# Patient Record
Sex: Female | Born: 1935 | Race: Black or African American | Hispanic: No | Smoking: Never smoker
Health system: Southern US, Community
[De-identification: ages and names within clinical notes are randomized; demographics above are authoritative.]

## PROBLEM LIST (undated history)

## (undated) DIAGNOSIS — L89152 Pressure ulcer of sacral region, stage 2: Secondary | ICD-10-CM

## (undated) DIAGNOSIS — M1990 Unspecified osteoarthritis, unspecified site: Secondary | ICD-10-CM

## (undated) DIAGNOSIS — T7840XA Allergy, unspecified, initial encounter: Secondary | ICD-10-CM

## (undated) DIAGNOSIS — C50919 Malignant neoplasm of unspecified site of unspecified female breast: Secondary | ICD-10-CM

## (undated) DIAGNOSIS — G309 Alzheimer's disease, unspecified: Secondary | ICD-10-CM

## (undated) DIAGNOSIS — F0280 Dementia in other diseases classified elsewhere without behavioral disturbance: Secondary | ICD-10-CM

## (undated) HISTORY — DX: Alzheimer's disease, unspecified: G30.9

## (undated) HISTORY — DX: Dementia in other diseases classified elsewhere without behavioral disturbance: F02.80

## (undated) HISTORY — DX: Malignant neoplasm of unspecified site of unspecified female breast: C50.919

## (undated) HISTORY — DX: Unspecified osteoarthritis, unspecified site: M19.90

## (undated) HISTORY — DX: Allergy, unspecified, initial encounter: T78.40XA

## (undated) HISTORY — DX: Pressure ulcer of sacral region, stage 2: L89.152

---

## 1958-09-03 HISTORY — PX: ABDOMINAL HYSTERECTOMY: SHX81

## 2004-01-03 DIAGNOSIS — F0280 Dementia in other diseases classified elsewhere without behavioral disturbance: Secondary | ICD-10-CM

## 2004-01-03 HISTORY — DX: Dementia in other diseases classified elsewhere, unspecified severity, without behavioral disturbance, psychotic disturbance, mood disturbance, and anxiety: F02.80

## 2005-01-02 HISTORY — PX: MASTECTOMY: SHX3

## 2007-02-03 ENCOUNTER — Ambulatory Visit: Payer: Self-pay | Admitting: Oncology

## 2007-02-21 ENCOUNTER — Ambulatory Visit: Payer: Self-pay | Admitting: Oncology

## 2007-03-03 ENCOUNTER — Ambulatory Visit: Payer: Self-pay | Admitting: Oncology

## 2007-03-06 ENCOUNTER — Ambulatory Visit: Payer: Self-pay | Admitting: Oncology

## 2007-04-03 ENCOUNTER — Ambulatory Visit: Payer: Self-pay | Admitting: Oncology

## 2007-05-03 ENCOUNTER — Ambulatory Visit: Payer: Self-pay | Admitting: Oncology

## 2007-06-26 ENCOUNTER — Ambulatory Visit: Payer: Self-pay | Admitting: Oncology

## 2007-07-03 ENCOUNTER — Ambulatory Visit: Payer: Self-pay | Admitting: Oncology

## 2007-08-03 ENCOUNTER — Ambulatory Visit: Payer: Self-pay | Admitting: Oncology

## 2007-08-26 ENCOUNTER — Ambulatory Visit: Payer: Self-pay | Admitting: Oncology

## 2007-09-03 ENCOUNTER — Ambulatory Visit: Payer: Self-pay | Admitting: Oncology

## 2007-10-03 ENCOUNTER — Ambulatory Visit: Payer: Self-pay | Admitting: Oncology

## 2007-10-31 ENCOUNTER — Ambulatory Visit: Payer: Self-pay | Admitting: Internal Medicine

## 2007-10-31 DIAGNOSIS — F0280 Dementia in other diseases classified elsewhere without behavioral disturbance: Secondary | ICD-10-CM | POA: Insufficient documentation

## 2007-10-31 DIAGNOSIS — R443 Hallucinations, unspecified: Secondary | ICD-10-CM | POA: Insufficient documentation

## 2007-10-31 DIAGNOSIS — G309 Alzheimer's disease, unspecified: Secondary | ICD-10-CM

## 2007-10-31 DIAGNOSIS — Z853 Personal history of malignant neoplasm of breast: Secondary | ICD-10-CM | POA: Insufficient documentation

## 2007-10-31 DIAGNOSIS — J309 Allergic rhinitis, unspecified: Secondary | ICD-10-CM | POA: Insufficient documentation

## 2007-11-03 ENCOUNTER — Ambulatory Visit: Payer: Self-pay | Admitting: Oncology

## 2007-11-06 LAB — CONVERTED CEMR LAB
ALT: 20 units/L (ref 0–35)
Chloride: 108 meq/L (ref 96–112)
Eosinophils Absolute: 0.3 10*3/uL (ref 0.0–0.7)
Eosinophils Relative: 3.9 % (ref 0.0–5.0)
GFR calc Af Amer: 70 mL/min
HCT: 37.1 % (ref 36.0–46.0)
Hemoglobin: 12.2 g/dL (ref 12.0–15.0)
MCV: 91 fL (ref 78.0–100.0)
Monocytes Absolute: 1.2 10*3/uL — ABNORMAL HIGH (ref 0.1–1.0)
Monocytes Relative: 16.5 % — ABNORMAL HIGH (ref 3.0–12.0)
Neutro Abs: 3.1 10*3/uL (ref 1.4–7.7)
Phosphorus: 3.5 mg/dL (ref 2.3–4.6)
Platelets: 222 10*3/uL (ref 150–400)
Potassium: 3.9 meq/L (ref 3.5–5.1)
RDW: 12.7 % (ref 11.5–14.6)
Sodium: 144 meq/L (ref 135–145)
TSH: 1.03 microintl units/mL (ref 0.35–5.50)
Total Bilirubin: 0.6 mg/dL (ref 0.3–1.2)
Total Protein: 7.1 g/dL (ref 6.0–8.3)
Vitamin B-12: 749 pg/mL (ref 211–911)

## 2007-11-18 ENCOUNTER — Ambulatory Visit: Payer: Self-pay | Admitting: Oncology

## 2007-12-03 ENCOUNTER — Ambulatory Visit: Payer: Self-pay | Admitting: Oncology

## 2007-12-25 ENCOUNTER — Ambulatory Visit: Payer: Self-pay | Admitting: Internal Medicine

## 2007-12-25 DIAGNOSIS — H1130 Conjunctival hemorrhage, unspecified eye: Secondary | ICD-10-CM | POA: Insufficient documentation

## 2008-06-15 ENCOUNTER — Encounter: Payer: Self-pay | Admitting: Internal Medicine

## 2008-06-24 ENCOUNTER — Ambulatory Visit: Payer: Self-pay | Admitting: Internal Medicine

## 2008-06-24 DIAGNOSIS — M170 Bilateral primary osteoarthritis of knee: Secondary | ICD-10-CM | POA: Insufficient documentation

## 2008-11-03 ENCOUNTER — Ambulatory Visit: Payer: Self-pay | Admitting: Internal Medicine

## 2008-12-24 ENCOUNTER — Ambulatory Visit: Payer: Self-pay | Admitting: Internal Medicine

## 2008-12-24 ENCOUNTER — Encounter: Payer: Self-pay | Admitting: Internal Medicine

## 2009-01-11 ENCOUNTER — Encounter: Payer: Self-pay | Admitting: Internal Medicine

## 2009-01-11 LAB — HM MAMMOGRAPHY: HM Mammogram: NORMAL

## 2009-03-17 ENCOUNTER — Ambulatory Visit: Payer: Self-pay | Admitting: Internal Medicine

## 2009-03-17 DIAGNOSIS — B351 Tinea unguium: Secondary | ICD-10-CM | POA: Insufficient documentation

## 2009-05-04 ENCOUNTER — Ambulatory Visit: Payer: Self-pay | Admitting: Internal Medicine

## 2009-06-01 ENCOUNTER — Encounter: Payer: Self-pay | Admitting: Internal Medicine

## 2009-07-14 ENCOUNTER — Telehealth: Payer: Self-pay | Admitting: Family Medicine

## 2009-07-15 ENCOUNTER — Encounter: Payer: Self-pay | Admitting: Internal Medicine

## 2009-07-15 ENCOUNTER — Encounter: Payer: Self-pay | Admitting: Family Medicine

## 2009-08-30 ENCOUNTER — Telehealth: Payer: Self-pay | Admitting: Internal Medicine

## 2009-08-30 ENCOUNTER — Encounter: Payer: Self-pay | Admitting: Internal Medicine

## 2009-10-07 ENCOUNTER — Encounter: Payer: Self-pay | Admitting: Internal Medicine

## 2009-10-07 ENCOUNTER — Ambulatory Visit: Payer: Self-pay | Admitting: Internal Medicine

## 2009-10-15 ENCOUNTER — Ambulatory Visit: Payer: Self-pay | Admitting: Internal Medicine

## 2009-10-15 DIAGNOSIS — L989 Disorder of the skin and subcutaneous tissue, unspecified: Secondary | ICD-10-CM | POA: Insufficient documentation

## 2009-10-18 ENCOUNTER — Encounter: Payer: Self-pay | Admitting: Internal Medicine

## 2009-11-29 ENCOUNTER — Encounter: Payer: Self-pay | Admitting: Internal Medicine

## 2009-11-30 ENCOUNTER — Telehealth: Payer: Self-pay | Admitting: Internal Medicine

## 2009-11-30 DIAGNOSIS — E119 Type 2 diabetes mellitus without complications: Secondary | ICD-10-CM | POA: Insufficient documentation

## 2009-12-01 ENCOUNTER — Telehealth: Payer: Self-pay | Admitting: Internal Medicine

## 2009-12-01 ENCOUNTER — Ambulatory Visit: Payer: Self-pay | Admitting: Internal Medicine

## 2009-12-02 ENCOUNTER — Encounter: Payer: Self-pay | Admitting: Internal Medicine

## 2009-12-02 ENCOUNTER — Telehealth: Payer: Self-pay | Admitting: Internal Medicine

## 2009-12-02 LAB — CONVERTED CEMR LAB
Alkaline Phosphatase: 109 units/L (ref 39–117)
BUN: 20 mg/dL (ref 6–23)
Basophils Absolute: 0 10*3/uL (ref 0.0–0.1)
Creatinine, Ser: 1.1 mg/dL (ref 0.4–1.2)
GFR calc non Af Amer: 65.48 mL/min (ref >60)
Glucose, Bld: 283 mg/dL — ABNORMAL HIGH (ref 70–99)
HCT: 43.9 % (ref 36.0–46.0)
Hgb A1c MFr Bld: 12.3 % — ABNORMAL HIGH (ref 4.6–6.5)
Lymphocytes Relative: 31.7 % (ref 12.0–46.0)
Lymphs Abs: 3.9 10*3/uL (ref 0.7–4.0)
Monocytes Relative: 6.7 % (ref 3.0–12.0)
Neutrophils Relative %: 60.9 % (ref 43.0–77.0)
Phosphorus: 3.3 mg/dL (ref 2.3–4.6)
Platelets: 220 10*3/uL (ref 150.0–400.0)
RDW: 14.5 % (ref 11.5–14.6)
Sodium: 150 meq/L — ABNORMAL HIGH (ref 135–145)
TSH: 0.91 microintl units/mL (ref 0.35–5.50)
Total Bilirubin: 0.6 mg/dL (ref 0.3–1.2)

## 2009-12-08 ENCOUNTER — Encounter: Payer: Self-pay | Admitting: Internal Medicine

## 2009-12-09 ENCOUNTER — Telehealth: Payer: Self-pay | Admitting: Internal Medicine

## 2009-12-09 ENCOUNTER — Encounter: Payer: Self-pay | Admitting: Internal Medicine

## 2009-12-30 ENCOUNTER — Encounter: Payer: Self-pay | Admitting: Internal Medicine

## 2010-01-20 ENCOUNTER — Encounter: Payer: Self-pay | Admitting: Internal Medicine

## 2010-01-20 DIAGNOSIS — G309 Alzheimer's disease, unspecified: Secondary | ICD-10-CM

## 2010-01-20 DIAGNOSIS — E119 Type 2 diabetes mellitus without complications: Secondary | ICD-10-CM

## 2010-01-20 DIAGNOSIS — M1990 Unspecified osteoarthritis, unspecified site: Secondary | ICD-10-CM

## 2010-01-20 DIAGNOSIS — R443 Hallucinations, unspecified: Secondary | ICD-10-CM

## 2010-01-20 DIAGNOSIS — F0280 Dementia in other diseases classified elsewhere without behavioral disturbance: Secondary | ICD-10-CM

## 2010-02-01 NOTE — Miscellaneous (Signed)
Summary: BS Orders/[facility] Orders/[facility]   Imported By: Lanelle Bal 12/04/2009 08:56:42  _____________________________________________________________________  External Attachment:    Type:   Image     Comment:   External Document

## 2010-02-01 NOTE — Miscellaneous (Signed)
Summary: Physician's Comments/[facility]  Physician's Comments/[facility]   Imported By: Sherian Rein 12/07/2009 12:17:31  _____________________________________________________________________  External Attachment:    Type:   Image     Comment:   External Document

## 2010-02-01 NOTE — Medication Information (Signed)
Summary: Diabetes Testing Supplies/[facility]  Diabetes Testing Supplies/[facility]   Imported By: Sherian Rein 12/07/2009 12:12:56  _____________________________________________________________________  External Attachment:    Type:   Image     Comment:   External Document

## 2010-02-01 NOTE — Miscellaneous (Signed)
Summary: FL2/[facility]   Imported By: Lanelle Bal 07/20/2009 12:36:36  _____________________________________________________________________  External Attachment:    Type:   Image     Comment:   External Document

## 2010-02-01 NOTE — Miscellaneous (Signed)
Summary: Lorazepam Order/[facility]  Lorazepam Order/[facility]   Imported By: Lanelle Bal 07/20/2009 12:37:26  _____________________________________________________________________  External Attachment:    Type:   Image     Comment:   External Document

## 2010-02-01 NOTE — Miscellaneous (Signed)
Summary: FL-2, Standing Orders, Care Plan/[facility]  FL-2, Standing Orders, Care Plan/[facility]   Imported By: Maryln Gottron 10/22/2009 14:07:23  _____________________________________________________________________  External Attachment:    Type:   Image     Comment:   External Document

## 2010-02-01 NOTE — Progress Notes (Signed)
Summary: call a nurse   Phone Note Call from Patient   Summary of Call: Triage Record Num: [id number] Operator: Kathleen Lime Patient Name: Tanya Clayton Call Date & Time: 11/30/2009 9:25:50PM Patient Phone: [phone] PCP: Tillman Abide Patient Gender: Female PCP Fax : [phone] Patient DOB: Sep 30, 1935 Practice Name: [facility] Reason for Call: Follow up note to Triage call from tonight 11/29. Patient had high glucose readings today. Orders were given per Dr Orlie Dakin to give insulin and check blood sugars (see previous note). Dr Artist Pais was contacted due to BS reading of 585 at 19:30 and gave orders as follows: Give 10 units of Novolog now. Check blood sugar in 1 hour, if over 400 give 20 units of Lantus. Start patient on Diabetic Diet, limit carbs to 25gms per meal. No sweetened juices or deserts. Increase water intake. Patient to f/u with Trenisha Lafavor tomorrow. Emergent sxs for Diabetes: Control Problems guidline was to call Jacobb Alen immediately. Information/orders given to Clois Dupes at [facility] (facility). Protocol(s) Used: Office Note Recommended Outcome per Protocol: Information Noted and Sent to Office Reason for Outcome: Caller information to office Care Advice:  ~ 11/30/2009 9:40:41PM Page Initial call taken by: Melody Comas,  December 01, 2009 8:07 AM  Follow-up for Phone Call        Triage Record Num: [id number] Operator: Kathleen Lime Patient Name: Tanya Clayton Call Date & Time: 11/30/2009 8:19:40PM Patient Phone: [phone] PCP: Tillman Abide Patient Gender: Female PCP Fax : [phone] Patient DOB: Jun 30, 1935 Practice Name: [facility] Reason for Call: Clois Dupes resident care coordinator Called .Patient who has not been previously dx with Diabetes had a blood sugar that was too high to register on mete at about 1400 todayr. Dr Alphonsus Sias ordered Novolog 10 units to be given immediatley. He ordered Insulin orders as follows: Repeat  Novolog 10U if bs is over 400, Lantus 10 units to be given daily, BS to be checked BID for 2 weeks. BS checked again at 19:30- results 585, Lantus 10 units given per order. Staff wants to know if Novolg 10 units should be given again now as ordered for BS greater than 400. Protocol(s) Used: Diabetes: Control Problems Recommended Outcome per Protocol: Call Zhania Shaheen Immediately Reason for Outcome: Blood sugar 250 mg/dl or higher even after taking extra insulin Care Advice:  ~ 11/ Follow-up by: Melody Comas,  December 01, 2009 8:09 AM

## 2010-02-01 NOTE — Letter (Signed)
Summary: Medical Info Form/[facility]  Medical Info Form/[facility]   Imported By: Lanelle Bal 06/08/2009 10:02:03  _____________________________________________________________________  External Attachment:    Type:   Image     Comment:   External Document

## 2010-02-01 NOTE — Progress Notes (Signed)
Summary: order form for diabetic supplies  Phone Note From Pharmacy   Caller: [facility] Summary of Call: Order form for diabetic supplies is on your desk. Initial call taken by: Lowella Petties CMA, AAMA,  December 02, 2009 12:39 PM  Follow-up for Phone Call        form done Follow-up by: Cindee Salt MD,  December 02, 2009 1:12 PM  Additional Follow-up for Phone Call Additional follow up Details #1::        form faxed and scanned, form mailed to [facility] Additional Follow-up by: Mervin Hack CMA Duncan Dull),  December 02, 2009 1:22 PM

## 2010-02-01 NOTE — Letter (Signed)
Summary: Results Follow up Letter  [place] [facility]  [address] [place], [place] [address]   Phone: [phone]  Fax: [phone]    01/11/2009 MRN: [id number]  [facility] [address] [place], [place]  [address]  Dear Ms. Romito,  The following are the results of your recent test(s):  Test         Result    Pap Smear:        Normal _____  Not Normal _____ Comments: ______________________________________________________ Cholesterol: LDL(Bad cholesterol):         Your goal is less than:         HDL (Good cholesterol):       Your goal is more than: Comments:  ______________________________________________________ Mammogram:        Normal __X___  Not Normal _____ Comments: Repeat in 1 year  ___________________________________________________________________ Hemoccult:        Normal _____  Not normal _______ Comments:    _____________________________________________________________________ Other Tests:    We routinely do not discuss normal results over the telephone.  If you desire a copy of the results, or you have any questions about this information we can discuss them at your next office visit.   Sincerely,      Tillman Abide, MD

## 2010-02-01 NOTE — Miscellaneous (Signed)
Summary: FL2  FL2   Imported By: Lanelle Bal 05/10/2009 10:18:09  _____________________________________________________________________  External Attachment:    Type:   Image     Comment:   External Document

## 2010-02-01 NOTE — Progress Notes (Signed)
Summary: blood sugar is elevated  Phone Note From Other Clinic   Caller: Clois Dupes at [facility]  [phone] Summary of Call: Eunice Blase checked pt's blood sugar, after eating, and it didnt register on the glucometer.  She checked another pt's BS with the same meter and it worked fine.  She thinks pt's blood sugar is above 500.  BP is 158/100,  complaining of a headache. Initial call taken by: Lowella Petties CMA, AAMA,  November 30, 2009 2:54 PM  Follow-up for Phone Call        will need to start insulin and check daily will also give 1 time dose of rapid acting insulin consider metformin  Orders written  discussed with Debbie Follow-up by: Cindee Salt MD,  November 30, 2009 3:22 PM  New Problems: DIABETES MELLITUS, TYPE II (ICD-250.00)   New Problems: DIABETES MELLITUS, TYPE II (ICD-250.00) New/Updated Medications: LANTUS SOLOSTAR 100 UNIT/ML SOLN (INSULIN GLARGINE) 10 units subcutaneously daily   Past History:  Past Medical History: Breast cancer, hx of---------------------------------------------Dr Marcell Anger [facility]) Altzheimer's-2006  -----------------------------------------------Dr Renaee Munda Allergic rhinitis Osteoarthritis Diabetes mellitus, type II

## 2010-02-01 NOTE — Progress Notes (Signed)
Summary: Need FL-2 updated  Phone Note From Other Clinic   Caller: [facility] Call For: Dr.Letvak/ Dr. Dayton Martes Summary of Call: received fax asking for Dr. Vassie Moselle signature on FL-2 form. I called Bonita Quin and advised that Dr.Letvak will not be in until Monday, I asked if Dr. Dayton Martes could sign, she said yes just have her inital and date. Pt will be moving in 07/15/2009 and the fl-2 is dated 05/04/2009, per [employer] says it must be signed 24hr before move in. Bonita Quin did say if pt came with enough meds to last her until monday then Dr. Dayton Martes wouldn't need to sign, BUT if she didn't then the pharmacy won't fill with out up to date fl-2. Dr. Dayton Martes the form is in your inbox. Initial call taken by: Mervin Hack CMA Duncan Dull),  July 14, 2009 4:35 PM  Follow-up for Phone Call        in my box. Ruthe Mannan MD  July 15, 2009 7:44 AM  form faxed and scanned, by Lupita Leash Follow-up by: Mervin Hack CMA Duncan Dull),  July 15, 2009 8:59 AM

## 2010-02-01 NOTE — Assessment & Plan Note (Signed)
Summary: ROA 6 MTHS CYD   Vital Signs:  Patient profile:   75 year old female Weight:      194 pounds BMI:     36.79 Temp:     98.0 degrees F oral Pulse rate:   76 / minute Pulse rhythm:   regular BP sitting:   130 / 80  (left arm) Cuff size:   regular  Vitals Entered By: Mervin Hack CMA Duncan Dull) (May 04, 2009 9:17 AM) CC: 6 month follow-up   History of Present Illness: Planning to have her move to [place] this should be happening soon  FL-2 needs to be done  Dementia has worsened challenges with bathroom and bathing---- still needs bath drawn then washes herself needs help to clean after toilet Needs cueing --like putting toothpaste on brush Dresses with help---needs set up Still continent  more confusion Hasn't had elopement attempts but goes up and down steps repeatedly--pacing, etc Anxious at times  Still with hallucinations No increase Still on seroquel --helps per son No paranoia  No apparent arthritis problems    Allergies: No Known Drug Allergies  Past History:  Past medical, surgical, family and social histories (including risk factors) reviewed for relevance to current acute and chronic problems.  Past Medical History: Reviewed history from 06/24/2008 and no changes required. Breast cancer, hx of---------------------------------------------Dr Marcell Anger [facility]) Altzheimer's-2006  -----------------------------------------------Dr Renaee Munda Allergic rhinitis Osteoarthritis  Past Surgical History: Reviewed history from 10/31/2007 and no changes required. Right mastectomy with implant--2007 Hysterectomy in 1960's  Family History: Reviewed history from 10/31/2007 and no changes required. Mom died of DM complications? Not clear about other history except 2 sisters with brain tumors Brother and sister also with dementia  Social History: Reviewed history from 10/31/2007 and no changes required. Widowed 2009 1 son, 1 step daughter Never  Smoked Alcohol use-no Retired--schoolteacher  Review of Systems       appetite is fine weight is stable sleep is some better but still up intermittently. Lorazepam seems to help some  Physical Exam  General:  alert and normal appearance.   Neck:  supple, no masses, no thyromegaly, no carotid bruits, and no cervical lymphadenopathy.   Lungs:  normal respiratory effort and normal breath sounds.   Heart:  normal rate, regular rhythm, no murmur, and no gallop.   Abdomen:  soft and non-tender.   Neurologic:  alert not on task for conversations Psych:  normally interactive, good eye contact, not anxious appearing, and not depressed appearing.     Impression & Recommendations:  Problem # 1:  ALZHEIMERS DISEASE (ICD-331.0) Assessment Deteriorated slow deterioration ready for assisted living Fl-2 done  Problem # 2:  HALLUCINATIONS (ICD-780.1) Assessment: Unchanged still with good control on seroquel  Problem # 3:  OSTEOARTHRITIS (ICD-715.90) Assessment: Unchanged no major issues  Complete Medication List: 1)  Seroquel 100 Mg Tabs (Quetiapine fumarate) .... Take 1 tab by mouth at bedtime 2)  Lorazepam 0.5 Mg Tabs (Lorazepam) .... Take 1-2 tabs by mouth at bedtime as needed  Patient Instructions: 1)  Please schedule a follow-up appointment in 6 months .   Current Allergies (reviewed today): No known allergies

## 2010-02-01 NOTE — Assessment & Plan Note (Signed)
Summary: 8:15 ?TOE INFECTION,TB TEST/CLE   Vital Signs:  Patient profile:   75 year old female Weight:      194 pounds Temp:     98.1 degrees F oral Pulse rate:   60 / minute Pulse rhythm:   regular BP sitting:   120 / 70  (left arm) Cuff size:   regular  Vitals Entered By: Mervin Hack CMA Duncan Dull) (March 17, 2009 8:23 AM) CC: toe infection/ PPD   History of Present Illness: Has been having some pain in her right foot Especially noted during pedicure several days ago No problems walking on it Doesn't look right--- per DIL NO fever No discharge from toe    Allergies: No Known Drug Allergies  Past History:  Past medical, surgical, family and social histories (including risk factors) reviewed for relevance to current acute and chronic problems.  Past Medical History: Reviewed history from 06/24/2008 and no changes required. Breast cancer, hx of---------------------------------------------Dr Marcell Anger [facility]) Altzheimer's-2006  -----------------------------------------------Dr Renaee Munda Allergic rhinitis Osteoarthritis  Past Surgical History: Reviewed history from 10/31/2007 and no changes required. Right mastectomy with implant--2007 Hysterectomy in 1960's  Family History: Reviewed history from 10/31/2007 and no changes required. Mom died of DM complications? Not clear about other history except 2 sisters with brain tumors Brother and sister also with dementia  Social History: Reviewed history from 10/31/2007 and no changes required. Widowed 2009 1 son, 1 step daughter Never Smoked Alcohol use-no Retired--schoolteacher  Review of Systems       not acting sick eating okay due for PPD for her adult day care  Physical Exam  General:  alert and normal appearance.   Extremities:  NO edema Fungal toenail on right great toe no redness, discharge or tenderness   Impression & Recommendations:  Problem # 1:  ONYCHOMYCOSIS (ICD-110.1) Assessment  New asymptomatic discussed vick's vaporub or terbenafine with DIL but did not advise Rx  Complete Medication List: 1)  Seroquel 100 Mg Tabs (Quetiapine fumarate) .... Take 1 tab by mouth at bedtime 2)  Lorazepam 0.5 Mg Tabs (Lorazepam) .... Take 1-2 tabs by mouth at bedtime as needed  Other Orders: TB Skin Test [phone]) Admin 1st Vaccine (01093) Admin 1st Vaccine [facility]) [phone])  Patient Instructions: 1)  Please keep May 3rd appt  Current Allergies (reviewed today): No known allergies    PPD Application    Vaccine Type: PPD    Site: left forearm    Mfr: Sanofi Pasteur    Dose: 0.1 ml    Route: ID    Given by: Mervin Hack CMA (AAMA)    Exp. Date: 05/30/2011    Lot #: [id number]  Appended Document: 8:15 ?TOE INFECTION,TB TEST/CLE TB Skin test read today, 59mm-negative.  Nikki L. Fenton Malling, CMA (AAMA) 03/19/2009 10:04 AM

## 2010-02-01 NOTE — Progress Notes (Signed)
Summary: pt has been agitated  Phone Note From Other Clinic   Caller: Marcelino Duster at [facility]  [phone] Summary of Call: Pt has been very agitated since the week end-" mean", refuses help, doesnt want to do anything, undressing in the hallway- and aid is asking if they can increase her lorazepam dosing.  Right now she is only taking one at bedtime as needed, limit of one every 24 hours.  Fax  is [phone] Initial call taken by: Lowella Petties CMA,  August 30, 2009 10:53 AM  Follow-up for Phone Call        okay to change to three times a day as needed  Follow-up by: Cindee Salt MD,  August 30, 2009 10:58 AM  Additional Follow-up for Phone Call Additional follow up Details #1::        order faxed to [phone] Additional Follow-up by: DeShannon Smith CMA Duncan Dull),  August 30, 2009 11:13 AM    New/Updated Medications: LORAZEPAM 0.5 MG TABS (LORAZEPAM) take 1-2 tabs by mouth three times a day as needed agitation or anxiety

## 2010-02-01 NOTE — Assessment & Plan Note (Signed)
Summary: [facility]   Vital Signs:  Patient profile:   75 year old female Weight:      198 pounds BMI:     37.55 Temp:     96 degrees F Pulse rate:   96 / minute Resp:     18 per minute BP sitting:   116 / 70  History of Present Illness: Has been at [facility] for a few months Reviewed with Linda--clinical coordinator  Son here for the   visit [facility] Has had some wandering behaviors  remains incontinent and staff on toileting program  There has been some cognitive and functional decline since coming here Not depressed hasn't needed as needed lorazepam  Appetite has been good Sleeping okay--esp with PM lorazepam  No arthritis pain No sig edema  No allergy problems Only in the spring and mild then  Allergies: No Known Drug Allergies  Past History:  Past medical, surgical, family and social histories (including risk factors) reviewed for relevance to current acute and chronic problems.  Past Medical History: Reviewed history from 06/24/2008 and no changes required. Breast cancer, hx of---------------------------------------------Dr Marcell Anger [facility]) Altzheimer's-2006  -----------------------------------------------Dr Renaee Munda Allergic rhinitis Osteoarthritis  Past Surgical History: Reviewed history from 10/31/2007 and no changes required. Right mastectomy with implant--2007 Hysterectomy in 1960's  Family History: Reviewed history from 10/31/2007 and no changes required. Mom died of DM complications? Not clear about other history except 2 sisters with brain tumors Brother and sister also with dementia  Social History: Reviewed history from 10/31/2007 and no changes required. Widowed 2009 1 son, 1 step daughter Never Smoked Alcohol use-no Retired--schoolteacher  Review of Systems  The patient denies chest pain, syncope, and dyspnea on exertion.         weight is up 4#   Physical  Exam  General:  alert and normal appearance.   Neck:  supple, no masses, no thyromegaly, no carotid bruits, and no cervical lymphadenopathy.   Lungs:  normal respiratory effort, no intercostal retractions, no accessory muscle use, and normal breath sounds.   Heart:  normal rate, regular rhythm, no murmur, and no gallop.   Abdomen:  soft and non-tender.   Msk:  no joint tenderness and no joint swelling.   Extremities:  No edema Neurologic:  confused but pleasant Engages but not with logical speech walks okay Normal tone Psych:  not anxious appearing and not depressed appearing.     Impression & Recommendations:  Problem # 1:  ALZHEIMERS DISEASE (ICD-331.0) Assessment Comment Only slow progression but no major changes will be moving to the memory unit here soon  Problem # 2:  HALLUCINATIONS (ICD-780.1) Assessment: Unchanged controlled well with the seroquel will continue for now consider wean at next visit if doing well  Problem # 3:  OSTEOARTHRITIS (ICD-715.90) Assessment: Unchanged doing well without meds  Problem # 4:  ALLERGIC RHINITIS (ICD-477.9) Assessment: Comment Only spring only  Complete Medication List: 1)  Seroquel 100 Mg Tabs (Quetiapine fumarate) .... Take 1 tab by mouth at bedtime 2)  Lorazepam 0.5 Mg Tabs (Lorazepam) .... Take 1 tabs by mouth at bedtime and 1 tab twice a day as needed agitation or anxiety  Patient Instructions: 1)  Will plan follow up in  ~4 months

## 2010-02-01 NOTE — Assessment & Plan Note (Signed)
Summary: TB SKIN TEST/BRIUSE ON LEFT BREAST/[facility]   Vital Signs:  Patient profile:   75 year old female Weight:      196 pounds Temp:     97.8 degrees F oral BP sitting:   150 / 90  (left arm) Cuff size:   regular  Vitals Entered By: Mervin Hack CMA Duncan Dull) (October 15, 2009 12:19 PM) CC: bruise on breast, PPD skin test   History of Present Illness: Here with daughter  had just been seen last week Next day staff noted bruising on left breast She notes no pain no discharge  no change in status  Allergies: No Known Drug Allergies  Past History:  Past medical, surgical, family and social histories (including risk factors) reviewed for relevance to current acute and chronic problems.  Past Medical History: Reviewed history from 06/24/2008 and no changes required. Breast cancer, hx of---------------------------------------------Dr Marcell Anger [facility]) Altzheimer's-2006  -----------------------------------------------Dr Renaee Munda Allergic rhinitis Osteoarthritis  Past Surgical History: Reviewed history from 10/31/2007 and no changes required. Right mastectomy with implant--2007 Hysterectomy in 1960's  Family History: Reviewed history from 10/31/2007 and no changes required. Mom died of DM complications? Not clear about other history except 2 sisters with brain tumors Brother and sister also with dementia  Social History: Reviewed history from 10/31/2007 and no changes required. Widowed 2009 1 son, 1 step daughter Never Smoked Alcohol use-no Retired--schoolteacher  Review of Systems       eating okay no change in sleep  Physical Exam  General:  alert.  NAD Breasts:  lump under left subclavian scar (port a cath based on mammo 12/10) Skin:  slighly raised dark lesion (keratosis) on left breast  ~7 o'clock 10mm or so diameter central area of redness (as if lesion had been scraped off)   Impression & Recommendations:  Problem # 1:  SKIN LESION  (ICD-709.9) Assessment New may be benign lesion that has just been disturbed  P: observe    if that area doesn't scab and heal up, would treat as actinic with liquid nitrogen  Complete Medication List: 1)  Seroquel 100 Mg Tabs (Quetiapine fumarate) .... Take 1 tab by mouth at bedtime 2)  Lorazepam 0.5 Mg Tabs (Lorazepam) .... Take 1 tabs by mouth at bedtime and 1 tab twice a day as needed agitation or anxiety  Other Orders: TB Skin Test [phone]) Admin 1st Vaccine (13086) Admin 1st Vaccine [facility]) [phone])  Patient Instructions: 1)  Please set up nurse visit for Monday to check PPD 2)  Please call for appt if skin area doesn't heal in the next month or so 3)  Otherwise, visits will still be at [facility]  Current Allergies (reviewed today): No known allergies    PPD Application    Vaccine Type: PPD    Site: left forearm    Mfr: Sanofi Pasteur    Dose: 0.1 ml    Route: ID    Given by: Mervin Hack CMA (AAMA)    Exp. Date: 11/04/2010    Lot #: [id number]

## 2010-02-01 NOTE — Progress Notes (Signed)
Summary: needs order regarding blood sugar  Phone Note From Other Clinic   Caller: Debbie at [facility]  [phone] Summary of Call: Eunice Blase is asking for directions on when to call you regarding pt's blood sugar- how low and how high to let it go before they call.  Please fax order. Initial call taken by: Lowella Petties CMA, AAMA,  December 01, 2009 2:31 PM  Follow-up for Phone Call        orders done Please fax to the [facility] Follow-up by: Cindee Salt MD,  December 02, 2009 1:35 PM  Additional Follow-up for Phone Call Additional follow up Details #1::        order faxed and scanned Additional Follow-up by: DeShannon Katrinka Blazing CMA Duncan Dull),  December 02, 2009 4:44 PM

## 2010-02-01 NOTE — Progress Notes (Signed)
Summary: Lantus order  Phone Note Other Incoming   Summary of Call: Fax from [place] at [employer] in AM [phone] in evening  will increase lantus to 20 units daily continue two times a day checks for 1 more week Initial call taken by: Cindee Salt MD,  December 09, 2009 2:37 PM  Follow-up for Phone Call        order faxed back to [facility] and scanned. Follow-up by: Mervin Hack CMA Duncan Dull),  December 09, 2009 3:17 PM    New/Updated Medications: LANTUS SOLOSTAR 100 UNIT/ML SOLN (INSULIN GLARGINE) 20  units subcutaneously daily

## 2010-02-01 NOTE — Assessment & Plan Note (Signed)
Summary: blood sugar is elevated/alc   Vital Signs:  Patient profile:   75 year old female Weight:      184 pounds Temp:     98.0 degrees F tympanic BP sitting:   110 / 80  (left arm) Cuff size:   regular  Vitals Entered By: Mervin Hack CMA Duncan Dull) (December 01, 2009 12:49 PM) CC: check blood sugar   History of Present Illness: Sugars suddenly high sugar was 150 last month and now very high was checked for routine on antipsychotic See scanned notes  No history of DM in family She has felt fine No fevers  No cough No urinary symptoms  Allergies: No Known Drug Allergies  Review of Systems       weight is actually down appetite is fine has been sleeping fine has been thirsty and drinking more   Impression & Recommendations:  Problem # 1:  DIABETES MELLITUS, TYPE II (ICD-250.00) Assessment New  may be related to antipsychotic but not clear cut will add metformin and monitor sugars may need increased lantus  Her updated medication list for this problem includes:    Lantus Solostar 100 Unit/ml Soln (Insulin glargine) .Marland KitchenMarland KitchenMarland KitchenMarland Kitchen 10 units subcutaneously daily    Novolog 100 Unit/ml Soln (Insulin aspart) .Marland KitchenMarland KitchenMarland KitchenMarland Kitchen 10 units subcutaneously as needed if sugar over 350. 20 units subcutaneously if over 450    Metformin Hcl 500 Mg Tabs (Metformin hcl) .Marland Kitchen... 1 by mouth two times a day  Orders: Venipuncture (04540) TLB-Renal Function Panel (80069-RENAL) TLB-CBC Platelet - w/Differential (85025-CBCD) TLB-TSH (Thyroid Stimulating Hormone) (84443-TSH) TLB-Hepatic/Liver Function Pnl (80076-HEPATIC) TLB-A1C / Hgb A1C (Glycohemoglobin) (83036-A1C)  Problem # 2:  HALLUCINATIONS (ICD-780.1) Assessment: Improved will try cutting the seroquel  Problem # 3:  ALZHEIMERS DISEASE (ICD-331.0) Assessment: Comment Only slow progression of cognitive decline  Complete Medication List: 1)  Seroquel 100 Mg Tabs (Quetiapine fumarate) .... Take 1/2  tab by mouth at bedtime 2)  Lorazepam 0.5  Mg Tabs (Lorazepam) .... Take 1 tabs by mouth at bedtime and 1 tab twice a day as needed agitation or anxiety 3)  Lantus Solostar 100 Unit/ml Soln (Insulin glargine) .Marland Kitchen.. 10 units subcutaneously daily 4)  Novolog 100 Unit/ml Soln (Insulin aspart) .Marland Kitchen.. 10 units subcutaneously as needed if sugar over 350. 20 units subcutaneously if over 450 5)  Metformin Hcl 500 Mg Tabs (Metformin hcl) .Marland Kitchen.. 1 by mouth two times a day  Patient Instructions: 1)  Will plan follow up at [facility]   Orders Added: 1)  Est. Patient Level IV [98119] 2)  Venipuncture [14782] 3)  TLB-Renal Function Panel [80069-RENAL] 4)  TLB-CBC Platelet - w/Differential [85025-CBCD] 5)  TLB-TSH (Thyroid Stimulating Hormone) [84443-TSH] 6)  TLB-Hepatic/Liver Function Pnl [80076-HEPATIC] 7)  TLB-A1C / Hgb A1C (Glycohemoglobin) [83036-A1C]    Current Allergies (reviewed today): No known allergies   Appended Document: blood sugar is elevated/alc     Allergies: No Known Drug Allergies  Physical Exam  General:  alert and normal appearance.   Neck:  supple, no masses, no thyromegaly, and no cervical lymphadenopathy.   Lungs:  normal respiratory effort, no intercostal retractions, no accessory muscle use, and normal breath sounds.   Heart:  normal rate, regular rhythm, no murmur, and no gallop.   Abdomen:  soft and non-tender.   Neurologic:  doesn't engage much but is cooperative Psych:  not anxious appearing and not depressed appearing.     Complete Medication List: 1)  Seroquel 100 Mg Tabs (Quetiapine fumarate) .... Take 1/2  tab by  mouth at bedtime 2)  Lorazepam 0.5 Mg Tabs (Lorazepam) .... Take 1 tabs by mouth at bedtime and 1 tab twice a day as needed agitation or anxiety 3)  Lantus Solostar 100 Unit/ml Soln (Insulin glargine) .Marland Kitchen.. 10 units subcutaneously daily 4)  Novolog 100 Unit/ml Soln (Insulin aspart) .Marland Kitchen.. 10 units subcutaneously as needed if sugar over 350. 20 units subcutaneously if over 450 5)  Metformin  Hcl 500 Mg Tabs (Metformin hcl) .Marland Kitchen.. 1 by mouth two times a day

## 2010-02-01 NOTE — Miscellaneous (Signed)
Summary: Lor[facility]epam Order/[facility]  Lorazepam Order/[facility]   Imported By: Lanelle Bal 09/08/2009 11:41:57  _____________________________________________________________________  External Attachment:    Type:   Image     Comment:   External Document

## 2010-02-03 NOTE — Miscellaneous (Signed)
Summary: [facility]/Lantus & Novolog Orders/[facility]/Lantus & Novolog Orders/[facility]   Imported By: Lanelle Bal 12/16/2009 14:34:36  _____________________________________________________________________  External Attachment:    Type:   Image     Comment:   External Document

## 2010-02-03 NOTE — Miscellaneous (Signed)
Summary: Physician's Orders/[facility] [place]   Imported By: Maryln Gottron 01/06/2010 13:00:39  _____________________________________________________________________  External Attachment:    Type:   Image     Comment:   External Document

## 2010-02-03 NOTE — Assessment & Plan Note (Signed)
Summary: [facility] at  [facility]   Vital Signs:  Patient profile:   75 year old female Weight:      192 pounds Temp:     98.3 degrees F Pulse rate:   78 / minute Resp:     18 per minute BP sitting:   120 / 90  History of Present Illness: DOing fairly well SOn here for visit Reviewed status with Debbie--unit coordinator  Things are fine No new concerns from son or Eunice Blase Sugars have been fine 100-140 fasting  She has really settled in here  Very satisfied No mood issues  Walks around independently  No apparent pain problems No sig agitation now  Still needs assist with ADLs eats independently after set up Using toileting program  Allergies: No Known Drug Allergies  Past History:  Past medical, surgical, family and social histories (including risk factors) reviewed for relevance to current acute and chronic problems.  Past Medical History: Reviewed history from 11/30/2009 and no changes required. Breast cancer, hx of---------------------------------------------Dr Marcell Anger [facility]) Altzheimer's-2006  -----------------------------------------------Dr Renaee Munda Allergic rhinitis Osteoarthritis Diabetes mellitus, type II  Past Surgical History: Reviewed history from 10/31/2007 and no changes required. Right mastectomy with implant--2007 Hysterectomy in 1960's  Family History: Reviewed history from 10/31/2007 and no changes required. Mom died of DM complications? Not clear about other history except 2 sisters with brain tumors Brother and sister also with dementia  Social History: Reviewed history from 10/31/2007 and no changes required. Widowed 2009 1 son, 1 step daughter Never Smoked Alcohol use-no Retired--schoolteacher  Review of Systems       sleeping okay weight is up some --expected on the insulin  Physical Exam  General:  alert and normal appearance.   Neck:  supple, no masses, no thyromegaly, and no cervical lymphadenopathy.     Lungs:  normal respiratory effort, no intercostal retractions, no accessory muscle use, and normal breath sounds.   Heart:  normal rate, regular rhythm, no murmur, and no gallop.   Abdomen:  soft and non-tender.   Msk:  no joint tenderness and no joint swelling.   Pulses:  1+ in feet Extremities:  No edema Psych:  normally interactive, good eye contact, not anxious appearing, and not depressed appearing.    Diabetes Management Exam:    Foot Exam (with socks and/or shoes not present):       Sensory-Pinprick/Light touch:          Left medial foot (L-4): normal          Left dorsal foot (L-5): normal          Left lateral foot (S-1): normal          Right medial foot (L-4): normal          Right dorsal foot (L-5): normal          Right lateral foot (S-1): normal       Inspection:          Left foot: normal          Right foot: normal       Nails:          Left foot: fungal infection          Right foot: fungal infection   Impression & Recommendations:  Problem # 1:  ALZHEIMERS DISEASE (ICD-331.0) Assessment Deteriorated slow progression but no striking changes  Problem # 2:  DIABETES MELLITUS, TYPE II (ICD-250.00) Assessment: Improved now seems to have good control will check A1c  in the next month or so  Her updated medication list for this problem includes:    Lantus Solostar 100 Unit/ml Soln (Insulin glargine) .Marland Kitchen... 20  units subcutaneously daily    Novolog 100 Unit/ml Soln (Insulin aspart) .Marland KitchenMarland KitchenMarland KitchenMarland Kitchen 10 units subcutaneously as needed if sugar over 350. 20 units subcutaneously if over 450    Metformin Hcl 500 Mg Tabs (Metformin hcl) .Marland Kitchen... 1 by mouth two times a day  Problem # 3:  HALLUCINATIONS (ICD-780.1) Assessment: Improved less evident Mood good will try to further wean seroquel  Problem # 4:  OSTEOARTHRITIS (ICD-715.90) Assessment: Unchanged mild and not apparently a problem of late  Complete Medication List: 1)  Seroquel 100 Mg Tabs (Quetiapine fumarate) ....  Take 1/2  tab by mouth at bedtime 2)  Lorazepam 0.5 Mg Tabs (Lorazepam) .... Take 1 tabs by mouth at bedtime and 1 tab twice a day as needed agitation or anxiety 3)  Lantus Solostar 100 Unit/ml Soln (Insulin glargine) .... 20  units subcutaneously daily 4)  Novolog 100 Unit/ml Soln (Insulin aspart) .Marland Kitchen.. 10 units subcutaneously as needed if sugar over 350. 20 units subcutaneously if over 450 5)  Metformin Hcl 500 Mg Tabs (Metformin hcl) .Marland Kitchen.. 1 by mouth two times a day  Patient Instructions: 1)  Will plan follow up in about 4 months

## 2010-02-03 NOTE — Miscellaneous (Signed)
Summary: BS Readings/[facility] Readings/[facility]   Imported By: Lanelle Bal 12/16/2009 14:32:45  _____________________________________________________________________  External Attachment:    Type:   Image     Comment:   External Document

## 2010-02-24 ENCOUNTER — Other Ambulatory Visit: Payer: Self-pay | Admitting: Internal Medicine

## 2010-02-24 ENCOUNTER — Encounter (INDEPENDENT_AMBULATORY_CARE_PROVIDER_SITE_OTHER): Payer: Self-pay | Admitting: *Deleted

## 2010-02-24 ENCOUNTER — Other Ambulatory Visit (INDEPENDENT_AMBULATORY_CARE_PROVIDER_SITE_OTHER): Payer: BC Managed Care – PPO

## 2010-02-24 DIAGNOSIS — E119 Type 2 diabetes mellitus without complications: Secondary | ICD-10-CM

## 2010-02-24 LAB — HEMOGLOBIN A1C: Hgb A1c MFr Bld: 7.8 % — ABNORMAL HIGH (ref 4.6–6.5)

## 2010-03-12 DIAGNOSIS — S1093XA Contusion of unspecified part of neck, initial encounter: Secondary | ICD-10-CM | POA: Insufficient documentation

## 2010-03-12 DIAGNOSIS — S0083XA Contusion of other part of head, initial encounter: Secondary | ICD-10-CM

## 2010-03-12 DIAGNOSIS — S0003XA Contusion of scalp, initial encounter: Secondary | ICD-10-CM | POA: Insufficient documentation

## 2010-03-14 ENCOUNTER — Encounter: Payer: Self-pay | Admitting: Internal Medicine

## 2010-03-14 ENCOUNTER — Telehealth: Payer: Self-pay | Admitting: Internal Medicine

## 2010-03-17 ENCOUNTER — Ambulatory Visit (INDEPENDENT_AMBULATORY_CARE_PROVIDER_SITE_OTHER): Payer: BC Managed Care – PPO | Admitting: Internal Medicine

## 2010-03-17 ENCOUNTER — Encounter: Payer: Self-pay | Admitting: Internal Medicine

## 2010-03-17 DIAGNOSIS — S1093XA Contusion of unspecified part of neck, initial encounter: Secondary | ICD-10-CM

## 2010-03-17 DIAGNOSIS — S0003XA Contusion of scalp, initial encounter: Secondary | ICD-10-CM

## 2010-03-22 NOTE — Progress Notes (Signed)
Summary: eye is bruised  Phone Note From Other Clinic   Caller: Claris Che at [facility] [phone] Summary of Call: Claris Che called to report that pt has a knot above right eye. Her eyelid and under eye are bruised.  They have been placing cold compresses on eye but are asking what else they can do.  This is hurting the pt, no known injury to eye. Initial call taken by: Lowella Petties CMA, AAMA,  March 14, 2010 10:41 AM  Follow-up for Phone Call        no other action appropriate if no pain Just continue intermittent cool packs  needs eval if pain, trouble with vision or pain moving eyes around Follow-up by: Cindee Salt MD,  March 14, 2010 11:09 AM  Additional Follow-up for Phone Call Additional follow up Details #1::        spoke with debbie and advised results, they will watch her and call if any problems. Additional Follow-up by: Mervin Hack CMA Duncan Dull),  March 14, 2010 3:48 PM

## 2010-03-22 NOTE — Letter (Signed)
Summary: [facility]  [facility]   Imported By: Maryln Gottron 03/18/2010 13:51:18  _____________________________________________________________________  External Attachment:    Type:   Image     Comment:   External Document

## 2010-03-22 NOTE — Assessment & Plan Note (Signed)
Summary: bruise under eye from fall/alc   Vital Signs:  Patient profile:   75 year old female Height:      61 inches Weight:      192 pounds BMI:     36.41 Temp:     97.2 degrees F oral Pulse rate:   76 / minute Pulse rhythm:   regular BP sitting:   120 / 74  (left arm) Cuff size:   regular  Vitals Entered By: Mervin Hack CMA Duncan Dull) (March 17, 2010 4:07 PM) CC: bruise under right eye, ? injury at [facility] on Fri night or early Sat morning.   History of Present Illness: Staff at the [facility] noted bruise above right eye on Sunday Son noted it on visit at that time mild tenderness there No known trauma but came out of room 3/10 early AM crying but no immediate findings  Now with non tender bruising under right eye has looked better in past day  No headaches  Allergies (verified): No Known Drug Allergies  Past History:  Past medical, surgical, family and social histories (including risk factors) reviewed for relevance to current acute and chronic problems.  Past Medical History: Reviewed history from 11/30/2009 and no changes required. Breast cancer, hx of---------------------------------------------Dr Marcell Anger [facility]) Altzheimer's-2006  -----------------------------------------------Dr Renaee Munda Allergic rhinitis Osteoarthritis Diabetes mellitus, type II  Past Surgical History: Reviewed history from 10/31/2007 and no changes required. Right mastectomy with implant--2007 Hysterectomy in 1960's  Family History: Reviewed history from 10/31/2007 and no changes required. Mom died of DM complications? Not clear about other history except 2 sisters with brain tumors Brother and sister also with dementia  Social History: Reviewed history from 10/31/2007 and no changes required. Widowed 2009 1 son, 1 step daughter Never Smoked Alcohol use-no Retired--schoolteacher  Review of Systems       eating well no nausea or vomiting  Physical Exam  General:   alert and normal appearance.   Head:  no temporal tenderness now Eyes:  conj clear bilat bruising under right eye which is puffy and non tender EOM full   Impression & Recommendations:  Problem # 1:  CONTUSION, HEAD (ICD-920) Assessment New might have hit her head on door jamb going to bathroom Blood has pooled below eye due to gravity No Rx needed now  Complete Medication List: 1)  Seroquel 100 Mg Tabs (Quetiapine fumarate) .... Take 1/2  tab by mouth at bedtime 2)  Lorazepam 0.5 Mg Tabs (Lorazepam) .... Take 1 tabs by mouth at bedtime and 1 tab twice a day as needed agitation or anxiety 3)  Lantus Solostar 100 Unit/ml Soln (Insulin glargine) .... 20  units subcutaneously daily 4)  Novolog 100 Unit/ml Soln (Insulin aspart) .Marland Kitchen.. 10 units subcutaneously as needed if sugar over 350. 20 units subcutaneously if over 450 5)  Metformin Hcl 500 Mg Tabs (Metformin hcl) .Marland Kitchen.. 1 by mouth two times a day  Patient Instructions: 1)  Will keep regular follow up there at [facility]   Orders Added: 1)  Est. Patient Level III [40981]    Current Allergies (reviewed today): No known allergies

## 2010-04-07 ENCOUNTER — Other Ambulatory Visit: Payer: Self-pay

## 2010-04-07 NOTE — Telephone Encounter (Deleted)
Please disregard the refill note, has already been done by Theodis Sato.

## 2010-04-12 ENCOUNTER — Other Ambulatory Visit: Payer: Self-pay | Admitting: *Deleted

## 2010-04-12 NOTE — Telephone Encounter (Signed)
Form on your desk, pharmacy is [facility] need to be manually faxed, not added to Epic yet, pharmacy does not accept e-script.

## 2010-04-13 MED ORDER — LORAZEPAM 0.5 MG PO TABS: ORAL_TABLET | ORAL | Status: DC

## 2010-04-13 NOTE — Telephone Encounter (Signed)
Okay #60 x 3 

## 2010-04-13 NOTE — Telephone Encounter (Signed)
rx faxed to [facility] pharmacy, manually

## 2010-04-21 ENCOUNTER — Telehealth: Payer: Self-pay | Admitting: *Deleted

## 2010-04-21 NOTE — Telephone Encounter (Signed)
Okay to increase the seroquel to 50--please note on med list I will be abstracting entire chart in the next few weeks  Probably can't wean the lantus at this point She should just let me know if she has any low sugar reactions---then I would decrease the dose

## 2010-04-21 NOTE — Telephone Encounter (Addendum)
Tanya Clayton would like an order to increase the patient's Seroquel to 50 mg at bedtime which was the original dose that she was on. They were trying to cut back on this, but they have noticed more agitation and mood swings over the last couple of days. Tanya Clayton stated that she has faxed an order over to you to sign.  The patient's son talked with you back in January about cutting back on her insulin. Is there anything that they can do to cut back on the insulin since she is on the Metformin? Please advise.

## 2010-04-21 NOTE — Telephone Encounter (Signed)
Left message on [facility] voicemail with results, advised her to call with any questions.

## 2010-05-07 ENCOUNTER — Encounter: Payer: Self-pay | Admitting: Internal Medicine

## 2010-06-02 ENCOUNTER — Encounter: Payer: Self-pay | Admitting: Internal Medicine

## 2010-06-02 ENCOUNTER — Non-Acute Institutional Stay: Payer: BC Managed Care – PPO | Admitting: Internal Medicine

## 2010-06-02 VITALS — BP 130/84 | HR 70 | Temp 97.8°F | Resp 18 | Wt 192.0 lb

## 2010-06-02 DIAGNOSIS — F0280 Dementia in other diseases classified elsewhere without behavioral disturbance: Secondary | ICD-10-CM

## 2010-06-02 DIAGNOSIS — M1990 Unspecified osteoarthritis, unspecified site: Secondary | ICD-10-CM

## 2010-06-02 DIAGNOSIS — E119 Type 2 diabetes mellitus without complications: Secondary | ICD-10-CM

## 2010-06-02 DIAGNOSIS — R443 Hallucinations, unspecified: Secondary | ICD-10-CM

## 2010-06-02 DIAGNOSIS — G309 Alzheimer's disease, unspecified: Secondary | ICD-10-CM

## 2010-06-02 NOTE — Assessment & Plan Note (Signed)
No clear psychosis now Did have agitation and anger which is better on the increased seroquel Will continue

## 2010-06-02 NOTE — Assessment & Plan Note (Signed)
Slow progression Still doing well here in assisted living No Rx

## 2010-06-02 NOTE — Patient Instructions (Signed)
Will plan follow up in 4-6 months 

## 2010-06-02 NOTE — Assessment & Plan Note (Signed)
Mild stiffness without clear Parkinson's signs No meds needed

## 2010-06-02 NOTE — Progress Notes (Signed)
  Subjective:    Patient ID: Tanya Clayton, female    DOB: 1935/07/12, 75 y.o.   MRN: [id number]  HPI Son here Reviewed status with Debbie--unit coordinator  Doing much better since seroquel increased to 50 Seems happier Not agitated and upset so much No apparent hallucinations now  Stable functional status Still needs help with ADLs, on toileting program with reasonable success  Diabetic control seems fine Fasting sugars 115-201 or so--most under 160 No apparent hypoglycemia  No obvious pain Seems to be "tight" at times--per son No meds needed  Current outpatient prescriptions:insulin aspart (NOVOLOG) 100 UNIT/ML injection, Inject 10 Units into the skin. 10 units if sugar over 350 and 20 units if over 450, Disp: , Rfl: ;  insulin glargine (LANTUS SOLOSTAR) 100 UNIT/ML injection, Inject 20 Units into the skin at bedtime.  , Disp: , Rfl:  LORazepam (ATIVAN) 0.5 MG tablet, Take 0.5 mg by mouth at bedtime. And  1 tablet by mouth twice a day as needed for agitation (limit 2 doses per 24hr) , Disp: , Rfl: ;  metFORMIN (GLUCOPHAGE) 500 MG tablet, Take 500 mg by mouth 2 (two) times daily with a meal.  , Disp: , Rfl: ;  QUEtiapine (SEROQUEL) 50 MG tablet, Take 50 mg by mouth at bedtime.  , Disp: , Rfl:   Past Medical History  Diagnosis Date  . Alzheimer's disease 2006  . Breast cancer     Dr Orlie Dakin  . Allergy   . Arthritis   . Diabetes mellitus     Past Surgical History  Procedure Date  . Mastectomy 2007    Right--with implant  . Abdominal hysterectomy 1960's    Family History  Problem Relation Age of Onset  . Diabetes Mother     History   Social History  . Marital Status: Widowed    Spouse Name: N/A    Number of Children: 2  . Years of Education: N/A   Occupational History  . Schoolteacher     retired   Social History Main Topics  . Smoking status: Never Smoker   . Smokeless tobacco: Never Used  . Alcohol Use: No  . Drug Use: Not on file  . Sexually Active:  Not on file   Other Topics Concern  . Not on file   Social History Narrative   1 son, 1 step daughter   Review of Systems Sleeps reasonably well--still with night awakening at times No daytime somnolence Appetite remains fine     Objective:   Physical Exam  Constitutional: She appears well-developed and well-nourished. No distress.  Neck: Normal range of motion. Neck supple. No thyromegaly present.  Cardiovascular: Normal rate, regular rhythm and normal heart sounds.  Exam reveals no gallop.   No murmur heard. Pulmonary/Chest: Effort normal and breath sounds normal. No respiratory distress. She has no wheezes. She has no rales.  Abdominal: Soft. There is no tenderness.  Musculoskeletal: Normal range of motion. She exhibits no tenderness.       Thick calves but no pitting edema  Lymphadenopathy:    She has no cervical adenopathy.  Psychiatric: She has a normal mood and affect.       Engages but speech is not appropriate to visit topics          Assessment & Plan:

## 2010-06-02 NOTE — Assessment & Plan Note (Signed)
Reasonable control No apparent hypoglycemia No changes

## 2010-09-29 ENCOUNTER — Encounter: Payer: Self-pay | Admitting: Internal Medicine

## 2010-09-29 ENCOUNTER — Non-Acute Institutional Stay: Payer: BC Managed Care – PPO | Admitting: Internal Medicine

## 2010-09-29 VITALS — BP 142/78 | HR 62 | Temp 98.8°F | Resp 18 | Wt 191.0 lb

## 2010-09-29 DIAGNOSIS — G309 Alzheimer's disease, unspecified: Secondary | ICD-10-CM

## 2010-09-29 DIAGNOSIS — R443 Hallucinations, unspecified: Secondary | ICD-10-CM

## 2010-09-29 DIAGNOSIS — F0280 Dementia in other diseases classified elsewhere without behavioral disturbance: Secondary | ICD-10-CM

## 2010-09-29 DIAGNOSIS — E119 Type 2 diabetes mellitus without complications: Secondary | ICD-10-CM

## 2010-09-29 DIAGNOSIS — M1990 Unspecified osteoarthritis, unspecified site: Secondary | ICD-10-CM

## 2010-09-29 NOTE — Assessment & Plan Note (Signed)
Mild progression Moderate now No meds

## 2010-09-29 NOTE — Assessment & Plan Note (Signed)
Controlled on the seroquel Failed wean attempt earlier this year---no change now

## 2010-09-29 NOTE — Patient Instructions (Signed)
Will plan follow up in about 4 months

## 2010-09-29 NOTE — Assessment & Plan Note (Signed)
Well controlled without hypoglycemia Will discontinue the novolog---hasn't needed in a long time Would consider reducing lantus if A1c under 7% next time

## 2010-09-29 NOTE — Assessment & Plan Note (Signed)
Doesn't seem to be having pain issues No meds for now

## 2010-09-29 NOTE — Progress Notes (Signed)
Subjective:    Patient ID: Tanya Clayton, female    DOB: April 24, 1935, 75 y.o.   MRN: [id number]  HPI Doing well Reviewed status with Eunice Blase the unit coordinator Son, Fayrene Fearing  is here  No new concerns Agitation is controlled again with increase in seroquel again Sleeping fairly well Mood seems upbeat Now doesn't recognize son all the time Only recognizes sister--no other family members Now needs more help with ADLs---like cleaning after bathroom, washing hands, etc  Diabetes control has been good Sugars checked qAM--- run 100-160 but most are under 130 No apparent hypoglycemic reactions  No obvious arthritic pain Still walks on her own No joint swelling apparent  Current Outpatient Prescriptions on File Prior to Visit  Medication Sig Dispense Refill  . insulin aspart (NOVOLOG) 100 UNIT/ML injection Inject 10 Units into the skin. 10 units if sugar over 350 and 20 units if over 450      . insulin glargine (LANTUS SOLOSTAR) 100 UNIT/ML injection Inject 20 Units into the skin at bedtime.        Marland Kitchen LORazepam (ATIVAN) 0.5 MG tablet Take 0.5 mg by mouth at bedtime. And  1 tablet by mouth twice a day as needed for agitation (limit 2 doses per 24hr)       . metFORMIN (GLUCOPHAGE) 500 MG tablet Take 500 mg by mouth 2 (two) times daily with a meal.        . QUEtiapine (SEROQUEL) 50 MG tablet Take 50 mg by mouth at bedtime.          Not on File  Past Medical History  Diagnosis Date  . Alzheimer's disease 2006  . Breast cancer     Dr Orlie Dakin  . Allergy   . Arthritis   . Diabetes mellitus     Past Surgical History  Procedure Date  . Mastectomy 2007    Right--with implant  . Abdominal hysterectomy 1960's    Family History  Problem Relation Age of Onset  . Diabetes Mother     History   Social History  . Marital Status: Widowed    Spouse Name: N/A    Number of Children: 2  . Years of Education: N/A   Occupational History  . Schoolteacher     retired   Social History  Main Topics  . Smoking status: Never Smoker   . Smokeless tobacco: Never Used  . Alcohol Use: No  . Drug Use: Not on file  . Sexually Active: Not on file   Other Topics Concern  . Not on file   Social History Narrative   1 son, 1 step daughter   Review of Systems Appetite seems fine Weight is stable Bowels are fine---not always seeming to clear out though     Objective:   Physical Exam  Constitutional: She appears well-developed and well-nourished. No distress.  Neck: Normal range of motion. Neck supple. No thyromegaly present.  Cardiovascular: Normal rate, regular rhythm and normal heart sounds.  Exam reveals no gallop.   No murmur heard. Pulmonary/Chest: Effort normal and breath sounds normal. No respiratory distress. She has no wheezes. She has no rales.  Abdominal: Soft. There is no tenderness.  Musculoskeletal: Normal range of motion. She exhibits no tenderness.       Thick calves without pitting  Lymphadenopathy:    She has no cervical adenopathy.  Neurological:       Normal tone Engages fairly well but not much spontaneous speech  Skin: No rash noted.  No ulcers Mild venous stasis changes  Psychiatric: She has a normal mood and affect.          Assessment & Plan:

## 2010-12-19 ENCOUNTER — Other Ambulatory Visit: Payer: Self-pay | Admitting: *Deleted

## 2010-12-19 MED ORDER — GLUCOSE BLOOD VI STRP: ORAL_STRIP | Status: DC

## 2010-12-19 MED ORDER — GNP LANCETS SUPER THIN 30G MISC: Status: DC

## 2011-01-02 ENCOUNTER — Telehealth: Payer: Self-pay | Admitting: Internal Medicine

## 2011-01-02 NOTE — Telephone Encounter (Signed)
Spoke with son and advised results, he will call if appointment is needed.

## 2011-01-02 NOTE — Telephone Encounter (Signed)
I would probably just recommend they try some tylenol & a heating pad If she still won't get up, they probably need to set up an appt for evaluation

## 2011-01-02 NOTE — Telephone Encounter (Signed)
Tanya Clayton the patient's son called and stated she woke up this morning  and didn't want to get out of bed and the nurse said it seems like her back is hurting and he stated it happened one day last week and wanted to know what your thoughts were as far as is there anything they should do.

## 2011-03-02 ENCOUNTER — Non-Acute Institutional Stay: Payer: BC Managed Care – PPO | Admitting: Internal Medicine

## 2011-03-02 ENCOUNTER — Encounter: Payer: Self-pay | Admitting: Internal Medicine

## 2011-03-02 VITALS — BP 146/78 | HR 66 | Temp 98.3°F | Resp 18 | Wt 186.0 lb

## 2011-03-02 DIAGNOSIS — R443 Hallucinations, unspecified: Secondary | ICD-10-CM

## 2011-03-02 DIAGNOSIS — G309 Alzheimer's disease, unspecified: Secondary | ICD-10-CM

## 2011-03-02 DIAGNOSIS — M1990 Unspecified osteoarthritis, unspecified site: Secondary | ICD-10-CM

## 2011-03-02 DIAGNOSIS — E119 Type 2 diabetes mellitus without complications: Secondary | ICD-10-CM

## 2011-03-02 DIAGNOSIS — F0280 Dementia in other diseases classified elsewhere without behavioral disturbance: Secondary | ICD-10-CM

## 2011-03-02 NOTE — Progress Notes (Signed)
Subjective:    Patient ID: Tanya Clayton, female    DOB: 1935/09/07, 76 y.o.   MRN: [id number]  HPI Son Fayrene Fearing is here as usual Reviewed status with Eunice Blase the unit coordinator  Continues to need significant personal care Usually successful with toileting program Needs help with bathing and dressing also Does eat independently  Now never recognizes son Not even sister is recognized at times Doesn't remember herself in old pictures any more This is tough for him  Appetite remains fine Sugars 86-133 No hypoglycemic spells  Still seems to have hallucinations Not distressed  Current Outpatient Prescriptions on File Prior to Visit  Medication Sig Dispense Refill  . glucose blood test strip True Track  100 each  3  . GNP Lancets Super Thin MISC Test sugar twice daily.  100 each  3  . insulin aspart (NOVOLOG) 100 UNIT/ML injection Inject 10 Units into the skin. 10 units if sugar over 350 and 20 units if over 450      . insulin glargine (LANTUS SOLOSTAR) 100 UNIT/ML injection Inject 20 Units into the skin at bedtime.        Marland Kitchen LORazepam (ATIVAN) 0.5 MG tablet Take 0.5 mg by mouth at bedtime. And  1 tablet by mouth twice a day as needed for agitation (limit 2 doses per 24hr)       . metFORMIN (GLUCOPHAGE) 500 MG tablet Take 500 mg by mouth 2 (two) times daily with a meal.        . QUEtiapine (SEROQUEL) 50 MG tablet Take 50 mg by mouth at bedtime.          Not on File  Past Medical History  Diagnosis Date  . Alzheimer's disease 2006  . Breast cancer     Dr Orlie Dakin  . Allergy   . Arthritis   . Diabetes mellitus     Past Surgical History  Procedure Date  . Mastectomy 2007    Right--with implant  . Abdominal hysterectomy 1960's    Family History  Problem Relation Age of Onset  . Diabetes Mother     History   Social History  . Marital Status: Widowed    Spouse Name: N/A    Number of Children: 2  . Years of Education: N/A   Occupational History  . Schoolteacher     retired   Social History Main Topics  . Smoking status: Never Smoker   . Smokeless tobacco: Never Used  . Alcohol Use: No  . Drug Use: Not on file  . Sexually Active: Not on file   Other Topics Concern  . Not on file   Social History Narrative   1 son, 1 step daughter   Review of Systems Sleeps well Weight down 5#--not a problem as desired Bowels okay     Objective:   Physical Exam  Constitutional: She appears well-developed and well-nourished. No distress.  Neck: Normal range of motion. Neck supple.  Cardiovascular: Normal rate, regular rhythm and normal heart sounds.  Exam reveals no gallop.   No murmur heard. Pulmonary/Chest: Effort normal and breath sounds normal. No respiratory distress. She has no wheezes. She has no rales.  Abdominal: Soft. There is no tenderness.  Musculoskeletal: She exhibits no edema and no tenderness.  Lymphadenopathy:    She has no cervical adenopathy.  Neurological:       Engages but doesn't have any purposeful speech or response  Psychiatric: She has a normal mood and affect.  Assessment & Plan:

## 2011-03-02 NOTE — Assessment & Plan Note (Signed)
Doesn't seem to be bothered with pain

## 2011-03-02 NOTE — Assessment & Plan Note (Signed)
Continued slow decline No Rx appropriate at this point Still appropriate for assisted living in this memory unit

## 2011-03-02 NOTE — Assessment & Plan Note (Signed)
Continues to have very good control  No hypoglycemia

## 2011-03-02 NOTE — Assessment & Plan Note (Signed)
Failed wean of seroquel ~6-8 months ago Will not change for now

## 2011-04-06 ENCOUNTER — Telehealth: Payer: Self-pay | Admitting: Internal Medicine

## 2011-04-06 NOTE — Telephone Encounter (Signed)
Please send in orders for #100 x 3

## 2011-04-06 NOTE — Telephone Encounter (Signed)
Spoke with Debbie at [facility] and pt didn't need refills, her insurance has been canceled and debbie forgot to call back, that was the problem.

## 2011-04-06 NOTE — Telephone Encounter (Signed)
Nurse is calling from [facility], they are saying they need more syringes for insulin at night and insurance will not pay for unless they have a new order sent in and the nurse is saying they need it ASAP is possible.

## 2011-05-15 ENCOUNTER — Other Ambulatory Visit: Payer: Self-pay | Admitting: *Deleted

## 2011-05-15 NOTE — Telephone Encounter (Signed)
Please advise,  Nursing home patient.

## 2011-05-16 MED ORDER — INSULIN GLARGINE 100 UNIT/ML ~~LOC~~ SOLN: 20.0000 [IU] | Freq: Every day | SUBCUTANEOUS | Status: DC

## 2011-05-23 ENCOUNTER — Telehealth: Payer: Self-pay | Admitting: Internal Medicine

## 2011-05-23 ENCOUNTER — Other Ambulatory Visit: Payer: Self-pay | Admitting: *Deleted

## 2011-05-23 MED ORDER — LORAZEPAM 0.5 MG PO TABS: 0.5000 mg | ORAL_TABLET | Freq: Every day | ORAL | Status: DC

## 2011-05-23 NOTE — Telephone Encounter (Signed)
Okay #60 x 5 In assisted living and given by staff

## 2011-05-23 NOTE — Telephone Encounter (Signed)
Caller: [employer]; Pharmacy:; Phone Number: [phone]; Prescriber: Alphonsus Sias; Medication(s): Lorazepam need to know if can give 90 tablets due to order 1 @ hs, and BID prn.  Has been getting 90 tablets.  Self pay, more cost effective.  Please call her back ASAP.  Thank you.

## 2011-05-23 NOTE — Telephone Encounter (Signed)
Received refill request from pharmacy. Is it okay to refill medication? 

## 2011-05-23 NOTE — Telephone Encounter (Signed)
rx called into pharmacy

## 2011-05-24 MED ORDER — LORAZEPAM 0.5 MG PO TABS: 0.5000 mg | ORAL_TABLET | Freq: Every day | ORAL | Status: DC

## 2011-05-24 NOTE — Telephone Encounter (Signed)
Spoke with pharmacy and advised results   

## 2011-05-24 NOTE — Telephone Encounter (Signed)
Okay to give #90 with same refills

## 2011-06-06 ENCOUNTER — Other Ambulatory Visit: Payer: Self-pay | Admitting: *Deleted

## 2011-06-06 MED ORDER — METFORMIN HCL 500 MG PO TABS: 500.0000 mg | ORAL_TABLET | Freq: Two times a day (BID) | ORAL | Status: DC

## 2011-06-12 ENCOUNTER — Other Ambulatory Visit: Payer: Self-pay | Admitting: *Deleted

## 2011-06-12 MED ORDER — QUETIAPINE FUMARATE 50 MG PO TABS: 50.0000 mg | ORAL_TABLET | Freq: Every day | ORAL | Status: DC

## 2011-06-12 NOTE — Telephone Encounter (Signed)
Ok to fill 

## 2011-06-12 NOTE — Telephone Encounter (Signed)
Yes, okay for 1 year 

## 2011-06-12 NOTE — Telephone Encounter (Signed)
rx faxed to pharmacy manually  

## 2011-06-16 ENCOUNTER — Emergency Department: Payer: Self-pay | Admitting: Unknown Physician Specialty

## 2011-06-16 LAB — CBC
HCT: 38.1 % (ref 35.0–47.0)
RDW: 14 % (ref 11.5–14.5)
WBC: 8.7 10*3/uL (ref 3.6–11.0)

## 2011-06-16 LAB — COMPREHENSIVE METABOLIC PANEL
Albumin: 3.3 g/dL — ABNORMAL LOW (ref 3.4–5.0)
Alkaline Phosphatase: 82 U/L (ref 50–136)
Anion Gap: 9 (ref 7–16)
BUN: 10 mg/dL (ref 7–18)
Bilirubin,Total: 0.4 mg/dL (ref 0.2–1.0)
Chloride: 107 mmol/L (ref 98–107)
Co2: 28 mmol/L (ref 21–32)
Creatinine: 0.86 mg/dL (ref 0.60–1.30)
Glucose: 119 mg/dL — ABNORMAL HIGH (ref 65–99)
Osmolality: 287 (ref 275–301)
Potassium: 3.5 mmol/L (ref 3.5–5.1)
SGPT (ALT): 15 U/L
Sodium: 144 mmol/L (ref 136–145)
Total Protein: 6.7 g/dL (ref 6.4–8.2)

## 2011-06-16 LAB — TROPONIN I: Troponin-I: <0.02 ng/mL

## 2011-06-22 ENCOUNTER — Encounter: Payer: Self-pay | Admitting: Internal Medicine

## 2011-06-22 ENCOUNTER — Non-Acute Institutional Stay: Payer: BC Managed Care – PPO | Admitting: Internal Medicine

## 2011-06-22 VITALS — BP 116/70 | HR 60 | Resp 14

## 2011-06-22 DIAGNOSIS — R55 Syncope and collapse: Secondary | ICD-10-CM | POA: Insufficient documentation

## 2011-06-22 NOTE — Progress Notes (Signed)
  Subjective:    Patient ID: Tanya Clayton, female    DOB: 11/29/1935, 76 y.o.   MRN: [id number]  HPI ER at [facility] follow up Records reviewed  Had near syncopal spell several days ago Slumped over at table and close to being out Rescue called Not hypoglycemic Labs in ER okay Head CT negative Seems to be back to herself again  History hard to get from her but no chest pain or dyspnea No apparent orthostasis  Walks without assistance  Current Outpatient Prescriptions on File Prior to Visit  Medication Sig Dispense Refill  . glucose blood test strip True Track  100 each  3  . GNP Lancets Super Thin MISC Test sugar twice daily.  100 each  3  . insulin aspart (NOVOLOG) 100 UNIT/ML injection Inject 10 Units into the skin. 10 units if sugar over 350 and 20 units if over 450      . insulin glargine (LANTUS SOLOSTAR) 100 UNIT/ML injection Inject 20 Units into the skin at bedtime.  10 mL  11  . LORazepam (ATIVAN) 0.5 MG tablet Take 1 tablet (0.5 mg total) by mouth at bedtime. And  1 tablet by mouth twice a day as needed for agitation (limit 2 doses per 24hr)  90 tablet  5  . metFORMIN (GLUCOPHAGE) 500 MG tablet Take 1 tablet (500 mg total) by mouth 2 (two) times daily with a meal.  60 tablet  11  . QUEtiapine (SEROQUEL) 50 MG tablet Take 1 tablet (50 mg total) by mouth at bedtime.  30 tablet  11    Not on File  Past Medical History  Diagnosis Date  . Alzheimer's disease 2006  . Breast cancer     Dr Orlie Dakin  . Allergy   . Arthritis   . Diabetes mellitus     Past Surgical History  Procedure Date  . Mastectomy 2007    Right--with implant  . Abdominal hysterectomy 1960's    Family History  Problem Relation Age of Onset  . Diabetes Mother     History   Social History  . Marital Status: Widowed    Spouse Name: N/A    Number of Children: 2  . Years of Education: N/A   Occupational History  . Schoolteacher     retired   Social History Main Topics  . Smoking status: Never  Smoker   . Smokeless tobacco: Never Used  . Alcohol Use: No  . Drug Use: Not on file  . Sexually Active: Not on file   Other Topics Concern  . Not on file   Social History Narrative   1 son, 1 step daughter   Review of Systems Appetite is fine Weight is stable Seems to sleep well     Objective:   Physical Exam  Constitutional: She appears well-developed and well-nourished. No distress.  Neck: Normal range of motion. Neck supple. No thyromegaly present.  Cardiovascular: Normal rate, regular rhythm and normal heart sounds.  Exam reveals no gallop.   No murmur heard. Pulmonary/Chest: Effort normal and breath sounds normal. No respiratory distress. She has no wheezes. She has no rales.  Abdominal: Soft. There is no tenderness.  Musculoskeletal: She exhibits no edema and no tenderness.  Lymphadenopathy:    She has no cervical adenopathy.  Neurological:       No coherent language  Psychiatric:       Pleasant and cooperative          Assessment & Plan:

## 2011-06-22 NOTE — Assessment & Plan Note (Signed)
No obvious etiology Nothing from the ER evaluation Seems fine now---no changes

## 2011-08-17 ENCOUNTER — Non-Acute Institutional Stay: Payer: BC Managed Care – PPO | Admitting: Internal Medicine

## 2011-08-17 ENCOUNTER — Encounter: Payer: Self-pay | Admitting: Internal Medicine

## 2011-08-17 VITALS — BP 114/71 | HR 60 | Temp 97.4°F | Resp 16 | Wt 165.0 lb

## 2011-08-17 DIAGNOSIS — G309 Alzheimer's disease, unspecified: Secondary | ICD-10-CM

## 2011-08-17 DIAGNOSIS — R443 Hallucinations, unspecified: Secondary | ICD-10-CM

## 2011-08-17 DIAGNOSIS — E119 Type 2 diabetes mellitus without complications: Secondary | ICD-10-CM

## 2011-08-17 DIAGNOSIS — M1990 Unspecified osteoarthritis, unspecified site: Secondary | ICD-10-CM

## 2011-08-17 DIAGNOSIS — F0280 Dementia in other diseases classified elsewhere without behavioral disturbance: Secondary | ICD-10-CM

## 2011-08-17 NOTE — Assessment & Plan Note (Signed)
Moderate Has had clear decline Has lost some weight but likely not 20# Debbie will recheck weight and start supplements if needed

## 2011-08-17 NOTE — Progress Notes (Signed)
Subjective:    Patient ID: Tanya Clayton, female    DOB: 12-Nov-1935, 76 y.o.   MRN: [id number]  HPI Seen here with son as usual Reviewed with unit director Debbie  Continues to fade mentally Doesn't know son anymore Eats okay but clearly has lost weight---may be down as much as 20#---but I will have them recheck Probably closer to 10# loss (trouble getting her to let go on  Son notices a change in how she looks and how clothes fit  No behavioral problems No agitation No apparent psychotic symptoms  Seems to sleep okay Lorazepam clearly helps  Diabetes has been well controlled Sugars under 100 at times---though no hypoglycemic reactions at this point Highest is 128  Current Outpatient Prescriptions on File Prior to Visit  Medication Sig Dispense Refill  . glucose blood test strip True Track  100 each  3  . GNP Lancets Super Thin MISC Test sugar twice daily.  100 each  3  . insulin glargine (LANTUS SOLOSTAR) 100 UNIT/ML injection Inject 20 Units into the skin at bedtime.  10 mL  11  . LORazepam (ATIVAN) 0.5 MG tablet Take 1 tablet (0.5 mg total) by mouth at bedtime. And  1 tablet by mouth twice a day as needed for agitation (limit 2 doses per 24hr)  90 tablet  5  . metFORMIN (GLUCOPHAGE) 500 MG tablet Take 1 tablet (500 mg total) by mouth 2 (two) times daily with a meal.  60 tablet  11  . DISCONTD: QUEtiapine (SEROQUEL) 50 MG tablet Take 1 tablet (50 mg total) by mouth at bedtime.  30 tablet  11    Not on File  Past Medical History  Diagnosis Date  . Alzheimer's disease 2006  . Breast cancer     Dr Orlie Dakin  . Allergy   . Arthritis   . Diabetes mellitus     Past Surgical History  Procedure Date  . Mastectomy 2007    Right--with implant  . Abdominal hysterectomy 1960's    Family History  Problem Relation Age of Onset  . Diabetes Mother     History   Social History  . Marital Status: Widowed    Spouse Name: N/A    Number of Children: 2  . Years of  Education: N/A   Occupational History  . Schoolteacher     retired   Social History Main Topics  . Smoking status: Never Smoker   . Smokeless tobacco: Never Used  . Alcohol Use: No  . Drug Use: Not on file  . Sexually Active: Not on file   Other Topics Concern  . Not on file   Social History Narrative   1 son, 1 step daughter   Review of Systems Bowels are fine. 2 hour toileting program but staff note if she is roaming or seems distressed---this often indicates need to void No skin issues    Objective:   Physical Exam  Constitutional: She appears well-developed and well-nourished. No distress.       Does not appear wasted  Neck: Normal range of motion. Neck supple.  Cardiovascular: Normal rate, regular rhythm and normal heart sounds.  Exam reveals no gallop.   No murmur heard. Pulmonary/Chest: Effort normal and breath sounds normal. No respiratory distress. She has no wheezes. She has no rales.  Abdominal: Soft. There is no tenderness.  Musculoskeletal: She exhibits no edema and no tenderness.  Lymphadenopathy:    She has no cervical adenopathy.  Psychiatric:  Happy and cooperative Doesn't know me or son          Assessment & Plan:

## 2011-08-17 NOTE — Assessment & Plan Note (Signed)
No obvious arthritis pain

## 2011-08-17 NOTE — Assessment & Plan Note (Signed)
Not apparent now Will try to wean off the seroquel

## 2011-08-17 NOTE — Assessment & Plan Note (Signed)
Sugars are running good and under 100 at times Will cut back lantus given her weight loss

## 2011-09-13 ENCOUNTER — Other Ambulatory Visit: Payer: Self-pay | Admitting: Internal Medicine

## 2011-09-13 NOTE — Telephone Encounter (Signed)
Okay to refill for a year 

## 2011-09-13 NOTE — Telephone Encounter (Signed)
Tanya Clayton is going to assess on the lower dose and let me know if it should be continued Okay to refill in case she needs to continue

## 2011-09-13 NOTE — Telephone Encounter (Signed)
rx sent to pharmacy by e-script  

## 2011-09-13 NOTE — Telephone Encounter (Signed)
Should pt still be on this medication? Per the instructions? Please advise

## 2011-09-15 NOTE — Telephone Encounter (Signed)
Tanya Clayton from [place] wanted to know if Tanya Clayton had been contacted about refill. i advised Tanya Clayton was going to assess on the lower dose and let Dr Alphonsus Sias know if should be continued. OK to refill if needed. Tanya Clayton said she will send out.

## 2011-10-05 ENCOUNTER — Ambulatory Visit (INDEPENDENT_AMBULATORY_CARE_PROVIDER_SITE_OTHER): Payer: Medicare Other | Admitting: Internal Medicine

## 2011-10-05 ENCOUNTER — Encounter: Payer: Self-pay | Admitting: Internal Medicine

## 2011-10-05 ENCOUNTER — Telehealth: Payer: Self-pay | Admitting: *Deleted

## 2011-10-05 VITALS — BP 126/82 | HR 72 | Temp 98.3°F | Wt 147.2 lb

## 2011-10-05 DIAGNOSIS — N6320 Unspecified lump in the left breast, unspecified quadrant: Secondary | ICD-10-CM

## 2011-10-05 DIAGNOSIS — N63 Unspecified lump in unspecified breast: Secondary | ICD-10-CM

## 2011-10-05 DIAGNOSIS — R21 Rash and other nonspecific skin eruption: Secondary | ICD-10-CM

## 2011-10-05 NOTE — Assessment & Plan Note (Signed)
Doesn't appear to be allergic reaction Not infectious Most likely pityriasis rosea---if so, it may increase at first but should run its course within about 6 weeks No Rx but can try cortisone creams if she starts itching

## 2011-10-05 NOTE — Progress Notes (Signed)
  Subjective:    Patient ID: Tanya Clayton, female    DOB: 07-28-1935, 76 y.o.   MRN: [id number]  HPI Has lump in upper left breast Noted 2 days ago No apparent pain No discharge  Rash on lower back noted yesterday Now on back of arms also Hadn't been itching but now is a little  No new meds Same soap for the most part (new type of Dove soap)  Current Outpatient Prescriptions on File Prior to Visit  Medication Sig Dispense Refill  . glucose blood test strip True Track  100 each  3  . GNP Lancets Super Thin MISC Test sugar twice daily.  100 each  3  . insulin glargine (LANTUS) 100 UNIT/ML injection Inject 15 Units into the skin at bedtime.      Marland Kitchen LORazepam (ATIVAN) 0.5 MG tablet Take 1 tablet (0.5 mg total) by mouth at bedtime. And  1 tablet by mouth twice a day as needed for agitation (limit 2 doses per 24hr)  90 tablet  5  . metFORMIN (GLUCOPHAGE) 500 MG tablet Take 1 tablet (500 mg total) by mouth 2 (two) times daily with a meal.  60 tablet  11  . QUEtiapine (SEROQUEL) 25 MG tablet Take 1 tablet (25 mg total) by mouth at bedtime.  30 each  11  . DISCONTD: QUEtiapine (SEROQUEL) 50 MG tablet Take 25 mg by mouth at bedtime.        No Known Allergies  Past Medical History  Diagnosis Date  . Alzheimer's disease 2006  . Breast cancer     Dr Orlie Dakin  . Allergy   . Arthritis   . Diabetes mellitus     Past Surgical History  Procedure Date  . Mastectomy 2007    Right--with implant  . Abdominal hysterectomy 1960's    Family History  Problem Relation Age of Onset  . Diabetes Mother     History   Social History  . Marital Status: Widowed    Spouse Name: N/A    Number of Children: 2  . Years of Education: N/A   Occupational History  . Schoolteacher     retired   Social History Main Topics  . Smoking status: Never Smoker   . Smokeless tobacco: Never Used  . Alcohol Use: No  . Drug Use: Not on file  . Sexually Active: Not on file   Other Topics Concern  . Not  on file   Social History Narrative   1 son, 1 step daughter   Review of Systems Weight is down quite a bit more Not eating as much    Objective:   Physical Exam  Constitutional: She appears well-developed and well-nourished. No distress.  Genitourinary:       Cystic changes on left Hard mass laterally on left at 12 o'clock  Skin:       Papular rash ---irregular shaped or oval along left flank mostly No clear herald spot  Psychiatric:       Confused Gently resists exam but eventually allows          Assessment & Plan:

## 2011-10-05 NOTE — Telephone Encounter (Signed)
ok 

## 2011-10-05 NOTE — Telephone Encounter (Signed)
Son calling because he would like to bring his mother in, she's a resident at [facility] and would like her to be seen for a rash that's all over her body. Please advise, the 4:30 is blocked just in case she needs it.

## 2011-10-05 NOTE — Telephone Encounter (Signed)
Okay to add her in the 4:30 slot

## 2011-10-05 NOTE — Assessment & Plan Note (Signed)
Feels like cancer Had right mastectomy and then was cancer free  Discussed options with son----will just observe for now  i am concerned about weight loss but I don't think it could be related to this Will add supplements

## 2011-10-26 DIAGNOSIS — Z23 Encounter for immunization: Secondary | ICD-10-CM | POA: Diagnosis not present

## 2011-12-11 ENCOUNTER — Telehealth: Payer: Self-pay | Admitting: Internal Medicine

## 2011-12-11 NOTE — Telephone Encounter (Signed)
[facility] Triage Call Report Triage Record Num: [id number] Operator: Baldomero Lamy Patient Name: Tanya Clayton Call Date & Time: 12/10/2011 9:09:44AM Patient Phone: [phone] PCP: Tillman Abide Patient Gender: Female PCP Fax : [phone] Patient DOB: [birth year] Practice Name: [facility] Reason for Call: Caller: Margaretmary Eddy; PCP: Tillman Abide [facility]); CB#: [phone]; Call regarding elevated blood sugar of 377 at 0830. Takes Metformin 500 mg BID-has taken this am (12/10/11). Also given 15 units of Lantus at hs but was recently decreased from 20 units of Lantus SQ at hs. Pt is asymptomatic and has eaten her diabetic breakfast. Now singing with church service and "seems fine". No other s/x or complaints. Afebrile. Emergent sx of Diabetes: Control Problems r/o. See w/in 4 hrs for: New or increase in symptoms or glucose out of control as defined by provider or action plan AND taking medication/following therapy as prescribed. Service is almost over per Med Tech. [place] to recheck blood sugar as soon as pt is out of church service. If < 400 mg/dl, recheck 4 hrs from that time. If > 400 at any blood sugar check, call back immediately; Per standing orders. Delice Bison verbalized understanding. Care advice given with call back parameters Protocol(s) Used: Diabetes: Control Problems Recommended Outcome per Protocol: See Provider within 4 hours Reason for Outcome: New or increasing symptoms or glucose out of control as defined by provider or action plan AND taking medications/following therapy as prescribed Care Advice: ~ 12/10/2011 9:47:45AM Page 1 of 1 [id number]

## 2011-12-11 NOTE — Telephone Encounter (Signed)
Would not change meds unless persistent elevations Would need eval for possible infection, etc

## 2012-01-18 ENCOUNTER — Encounter: Payer: Self-pay | Admitting: Internal Medicine

## 2012-01-18 ENCOUNTER — Non-Acute Institutional Stay: Payer: Medicare Other | Admitting: Internal Medicine

## 2012-01-18 VITALS — BP 132/64 | HR 69 | Temp 98.6°F | Resp 18 | Wt 138.0 lb

## 2012-01-18 DIAGNOSIS — E119 Type 2 diabetes mellitus without complications: Secondary | ICD-10-CM

## 2012-01-18 DIAGNOSIS — R443 Hallucinations, unspecified: Secondary | ICD-10-CM

## 2012-01-18 DIAGNOSIS — N63 Unspecified lump in unspecified breast: Secondary | ICD-10-CM

## 2012-01-18 DIAGNOSIS — F0280 Dementia in other diseases classified elsewhere without behavioral disturbance: Secondary | ICD-10-CM

## 2012-01-18 DIAGNOSIS — N6320 Unspecified lump in the left breast, unspecified quadrant: Secondary | ICD-10-CM

## 2012-01-18 DIAGNOSIS — G309 Alzheimer's disease, unspecified: Secondary | ICD-10-CM

## 2012-01-18 NOTE — Assessment & Plan Note (Signed)
No major functional problems but has lost weight Eats okay and takes supplements Sleeps fairly well with the lorazepam

## 2012-01-18 NOTE — Assessment & Plan Note (Signed)
Lab Results  Component Value Date   HGBA1C 7.8* 02/24/2010   Control is actually better More weight loss Cut back on fingersticks Will reduce lantus if she has lower sugars to avoid hypoglycemia

## 2012-01-18 NOTE — Assessment & Plan Note (Signed)
Not evident now--even off the seroquel If she has them, not bothersome

## 2012-01-18 NOTE — Assessment & Plan Note (Signed)
Continue to monitor without action

## 2012-01-18 NOTE — Progress Notes (Signed)
  Subjective:    Patient ID: Tanya Clayton, female    DOB: 04-22-1935, 77 y.o.   MRN: [id number]  HPI Seen with Debbie--unit coordinator Son not able to come in  Still with itching on back Seems to be dry skin Using aveeno after baths and triamcinolone prn  No obvious hallucinations now Off the seroquel without any problems  Sugars all under 140 and most under 120 Will decrease the frequency No hypoglycemic reactions  Eats well and takes ensure supplements Has still lost considerable weight Now close to ideal weight though No changes since she eats fairly well  Current Outpatient Prescriptions on File Prior to Visit  Medication Sig Dispense Refill  . glucose blood test strip True Track  100 each  3  . GNP Lancets Super Thin MISC Test sugar twice daily.  100 each  3  . insulin glargine (LANTUS) 100 UNIT/ML injection Inject 15 Units into the skin at bedtime.      Marland Kitchen LORazepam (ATIVAN) 0.5 MG tablet Take 1 tablet (0.5 mg total) by mouth at bedtime. And  1 tablet by mouth twice a day as needed for agitation (limit 2 doses per 24hr)  90 tablet  5  . metFORMIN (GLUCOPHAGE) 500 MG tablet Take 1 tablet (500 mg total) by mouth 2 (two) times daily with a meal.  60 tablet  11    No Known Allergies  Past Medical History  Diagnosis Date  . Alzheimer's disease 2006  . Breast cancer     Dr Orlie Dakin  . Allergy   . Arthritis   . Diabetes mellitus     Past Surgical History  Procedure Date  . Mastectomy 2007    Right--with implant  . Abdominal hysterectomy 1960's    Family History  Problem Relation Age of Onset  . Diabetes Mother     History   Social History  . Marital Status: Widowed    Spouse Name: N/A    Number of Children: 2  . Years of Education: N/A   Occupational History  . Schoolteacher     retired   Social History Main Topics  . Smoking status: Never Smoker   . Smokeless tobacco: Never Used  . Alcohol Use: No  . Drug Use: Not on file  . Sexually Active: Not  on file   Other Topics Concern  . Not on file   Social History Narrative   1 son, 1 step daughter   Review of Systems No apparent change in her breast mass Sleeps okay--but likes to stay up late at times (may just be awake in bed) Does seem to get benefit from the lorazepam  Has not needed the prn lorazepam though    Objective:   Physical Exam  Constitutional: She appears well-developed. No distress.  Neck: Normal range of motion. Neck supple. No thyromegaly present.  Cardiovascular: Normal rate, regular rhythm and normal heart sounds.  Exam reveals no gallop.   No murmur heard. Pulmonary/Chest: Effort normal and breath sounds normal. No respiratory distress. She has no wheezes. She has no rales.  Abdominal: Soft. There is no tenderness.  Musculoskeletal: She exhibits no edema and no tenderness.  Lymphadenopathy:    She has no cervical adenopathy.  Neurological:       Engages but no clear words Walks fairly well No focal weakness  Psychiatric:       Happy, smiling and pleasant          Assessment & Plan:

## 2012-03-01 ENCOUNTER — Other Ambulatory Visit: Payer: Self-pay

## 2012-03-01 MED ORDER — GLUCOSE BLOOD VI STRP: ORAL_STRIP | Status: DC

## 2012-03-01 NOTE — Telephone Encounter (Signed)
Tanya Clayton with Tanya Clayton left v/m requesting test strips to [facility]; [facility] had requested also.

## 2012-05-16 ENCOUNTER — Other Ambulatory Visit: Payer: Self-pay | Admitting: Internal Medicine

## 2012-05-16 NOTE — Telephone Encounter (Signed)
Received fax refill request, please advise 

## 2012-05-17 MED ORDER — LORAZEPAM 0.5 MG PO TABS: 0.5000 mg | ORAL_TABLET | Freq: Every day | ORAL | Status: DC

## 2012-05-17 NOTE — Telephone Encounter (Signed)
Okay #90 x 5 She is a resident of memory care at [facility]

## 2012-05-17 NOTE — Telephone Encounter (Signed)
rx called to pharmacy 

## 2012-06-05 ENCOUNTER — Encounter: Payer: Self-pay | Admitting: Radiology

## 2012-06-06 ENCOUNTER — Non-Acute Institutional Stay: Payer: Medicare Other | Admitting: Internal Medicine

## 2012-06-06 ENCOUNTER — Encounter: Payer: Self-pay | Admitting: Internal Medicine

## 2012-06-06 VITALS — BP 143/62 | HR 63 | Temp 97.2°F | Resp 20 | Wt 130.0 lb

## 2012-06-06 DIAGNOSIS — G309 Alzheimer's disease, unspecified: Secondary | ICD-10-CM

## 2012-06-06 DIAGNOSIS — E119 Type 2 diabetes mellitus without complications: Secondary | ICD-10-CM

## 2012-06-06 DIAGNOSIS — M1990 Unspecified osteoarthritis, unspecified site: Secondary | ICD-10-CM

## 2012-06-06 DIAGNOSIS — F0280 Dementia in other diseases classified elsewhere without behavioral disturbance: Secondary | ICD-10-CM

## 2012-06-06 NOTE — Assessment & Plan Note (Signed)
Seems to have excellent control Will recheck labs soon (has to be brought to [facility])

## 2012-06-06 NOTE — Assessment & Plan Note (Signed)
Has not been a significant problem of late No meds for this now

## 2012-06-06 NOTE — Progress Notes (Signed)
Subjective:    Patient ID: Tanya Clayton, female    DOB: December 27, 1935, 77 y.o.   MRN: [id number]  HPI Seen with Tanya Clayton the unit coordinator Son couldn't make it today  She has been doing fairly well No significant changes Continues to walk independently Is on a toileting program which is generally successful but she does have intermittent incontinence Requires help with all ADLs Will eat independently but lately has required more cueing and occasional actual hands on help  Mood seems to be good No apparent hallucinations or delusions  Doesn't seem to have sig arthritis pain No regular meds  Sugars are checked 3 times per week Generally in 70's to 130's No hypoglycemic reactions  Current Outpatient Prescriptions on File Prior to Visit  Medication Sig Dispense Refill  . glucose blood test strip True Track test strips; pt test blood sugar twice daily and as directed.250.00  100 each  3  . GNP Lancets Super Thin MISC Test sugar twice daily.  100 each  3  . insulin glargine (LANTUS) 100 UNIT/ML injection Inject 15 Units into the skin at bedtime.      Marland Kitchen LORazepam (ATIVAN) 0.5 MG tablet Take 1 tablet (0.5 mg total) by mouth at bedtime. And  1 tablet by mouth twice a day as needed for agitation (limit 2 doses per 24hr)  90 tablet  5  . metFORMIN (GLUCOPHAGE) 500 MG tablet Take 1 tablet (500 mg total) by mouth 2 (two) times daily with a meal.  60 tablet  11   No current facility-administered medications on file prior to visit.    No Known Allergies  Past Medical History  Diagnosis Date  . Alzheimer's disease 2006  . Breast cancer     Dr Tanya Clayton  . Allergy   . Arthritis   . Diabetes mellitus     Past Surgical History  Procedure Laterality Date  . Mastectomy  2007    Right--with implant  . Abdominal hysterectomy  1960's    Family History  Problem Relation Age of Onset  . Diabetes Mother     History   Social History  . Marital Status: Widowed    Spouse Name: N/A   Number of Children: 2  . Years of Education: N/A   Occupational History  . Schoolteacher     retired   Social History Main Topics  . Smoking status: Never Smoker   . Smokeless tobacco: Never Used  . Alcohol Use: No  . Drug Use: Not on file  . Sexually Active: Not on file   Other Topics Concern  . Not on file   Social History Narrative   1 son, 1 step daughter         Review of Systems Appetite is fine Weight is stable Sleeps well in general Bowels are fine     Objective:   Physical Exam  Constitutional: She appears well-developed and well-nourished. No distress.  Neck: Normal range of motion. Neck supple.  Cardiovascular: Normal rate, regular rhythm and normal heart sounds.  Exam reveals no gallop.   No murmur heard. Pulmonary/Chest: Effort normal and breath sounds normal. No respiratory distress. She has no wheezes. She has no rales.  Abdominal: Soft. There is no tenderness.  Musculoskeletal: She exhibits no edema and no tenderness.  Lymphadenopathy:    She has no cervical adenopathy.  Neurological:  No intelligible speech but engages in visit to some degree  Skin:  No foot lesions  Psychiatric: She has a normal mood and affect.  Her behavior is normal.          Assessment & Plan:

## 2012-06-06 NOTE — Assessment & Plan Note (Signed)
Now moderate Requires care with all ADLs Usually continent with program Still appropriate for this memory care setting

## 2012-06-10 DIAGNOSIS — E119 Type 2 diabetes mellitus without complications: Secondary | ICD-10-CM | POA: Diagnosis not present

## 2012-06-11 ENCOUNTER — Encounter: Payer: Self-pay | Admitting: Internal Medicine

## 2012-07-08 ENCOUNTER — Emergency Department: Payer: Self-pay | Admitting: Emergency Medicine

## 2012-07-08 DIAGNOSIS — R6889 Other general symptoms and signs: Secondary | ICD-10-CM | POA: Diagnosis not present

## 2012-07-08 DIAGNOSIS — R5381 Other malaise: Secondary | ICD-10-CM | POA: Diagnosis not present

## 2012-07-08 LAB — COMPREHENSIVE METABOLIC PANEL
Albumin: 3.4 g/dL (ref 3.4–5.0)
Anion Gap: 7 (ref 7–16)
Bilirubin,Total: 0.4 mg/dL (ref 0.2–1.0)
Calcium, Total: 9.1 mg/dL (ref 8.5–10.1)
Chloride: 109 mmol/L — ABNORMAL HIGH (ref 98–107)
Co2: 28 mmol/L (ref 21–32)
EGFR (African American): >60
EGFR (Non-African Amer.): >60
Glucose: 88 mg/dL (ref 65–99)
Osmolality: 286 (ref 275–301)
Potassium: 3.2 mmol/L — ABNORMAL LOW (ref 3.5–5.1)
SGOT(AST): 41 U/L — ABNORMAL HIGH (ref 15–37)
Sodium: 144 mmol/L (ref 136–145)

## 2012-07-08 LAB — CBC
HCT: 35.9 % (ref 35.0–47.0)
MCHC: 33 g/dL (ref 32.0–36.0)
MCV: 89 fL (ref 80–100)
Platelet: 237 10*3/uL (ref 150–440)
RBC: 4.04 10*6/uL (ref 3.80–5.20)
RDW: 13.6 % (ref 11.5–14.5)
WBC: 9.4 10*3/uL (ref 3.6–11.0)

## 2012-07-08 LAB — URINALYSIS, COMPLETE
Bacteria: NONE SEEN
Blood: NEGATIVE
Protein: NEGATIVE
RBC,UR: <1 /HPF (ref 0–5)
Specific Gravity: 1.027 (ref 1.003–1.030)
Squamous Epithelial: 2
WBC UR: <1 /HPF (ref 0–5)

## 2012-07-08 LAB — TROPONIN I: Troponin-I: <0.02 ng/mL

## 2012-08-16 ENCOUNTER — Other Ambulatory Visit: Payer: Self-pay | Admitting: Internal Medicine

## 2012-10-07 ENCOUNTER — Telehealth: Payer: Self-pay

## 2012-10-07 NOTE — Telephone Encounter (Signed)
Discussed that without specific symptoms, I am not worried about these nuimbers  I have not heard from [place] about any concerning changes in her condition either

## 2012-10-07 NOTE — Telephone Encounter (Signed)
Tanya Clayton, pts son left v/m; Tanya Clayton about concerns for vital signs; Tanya Clayton to see if Dr Alphonsus Clayton has any concerns; pt's BP this AM 77/32 P 103 T 98.4; rechecked again at 9:30 am 142/82 P 114. At 10 AM BP 117/61 P 94. Pt's leg was bothering pt and Tanya Clayton wonders if that could be the variance in BP. Tanya Clayton.

## 2012-10-10 ENCOUNTER — Encounter: Payer: Self-pay | Admitting: Internal Medicine

## 2012-10-11 ENCOUNTER — Telehealth: Payer: Self-pay | Admitting: *Deleted

## 2012-10-11 DIAGNOSIS — Z23 Encounter for immunization: Secondary | ICD-10-CM | POA: Diagnosis not present

## 2012-10-11 DIAGNOSIS — F0280 Dementia in other diseases classified elsewhere without behavioral disturbance: Secondary | ICD-10-CM | POA: Diagnosis not present

## 2012-10-11 NOTE — Telephone Encounter (Signed)
Spoke with Neysa Bonito, RN and gave verbal order as listed below.

## 2012-10-11 NOTE — Telephone Encounter (Signed)
Pt is a resident at [facility] @ [address]. Pt's son wants pt DNR. She currently doesn't have an order. Nurse would like to get a verbal order, then the signed golden rod. Nurse is request asap since the weekend is coming up and pt is declining.

## 2012-10-11 NOTE — Telephone Encounter (Signed)
Yes ok to give verbal order

## 2012-10-14 DIAGNOSIS — F0280 Dementia in other diseases classified elsewhere without behavioral disturbance: Secondary | ICD-10-CM | POA: Diagnosis not present

## 2012-10-15 DIAGNOSIS — F0280 Dementia in other diseases classified elsewhere without behavioral disturbance: Secondary | ICD-10-CM | POA: Diagnosis not present

## 2012-10-16 DIAGNOSIS — F0280 Dementia in other diseases classified elsewhere without behavioral disturbance: Secondary | ICD-10-CM | POA: Diagnosis not present

## 2012-10-17 ENCOUNTER — Telehealth: Payer: Self-pay

## 2012-10-17 DIAGNOSIS — F0280 Dementia in other diseases classified elsewhere without behavioral disturbance: Secondary | ICD-10-CM | POA: Diagnosis not present

## 2012-10-17 NOTE — Telephone Encounter (Signed)
Christy nurse with [facility] left v/m; pt is resident at [facility] of [address]. Pt having pain changes, not sure if related to arthritis. Pain worse in AM with a lot of stiffness in legs and lower back. Tylenol is not effective to take care of pain. Christy request cb with plan of care and different med.

## 2012-10-18 DIAGNOSIS — F0280 Dementia in other diseases classified elsewhere without behavioral disturbance: Secondary | ICD-10-CM | POA: Diagnosis not present

## 2012-10-18 NOTE — Telephone Encounter (Signed)
Please call her Apologize that I didn't get back to her yesterday---I didn't finish in office till ~6:30  Okay to add tramadol 25mg  tid prn  I am hoping to go out to the [place] on 10/30--but am not certain yet

## 2012-10-18 NOTE — Telephone Encounter (Signed)
Did you mean Tramadol 50mg ? There isn't a 25mg  unless she can cut them in half?

## 2012-10-18 NOTE — Telephone Encounter (Signed)
Left message for nurse on VM, advised results and to call back if any questions

## 2012-10-18 NOTE — Telephone Encounter (Signed)
Yes, they should cut in half

## 2012-10-19 DIAGNOSIS — F0280 Dementia in other diseases classified elsewhere without behavioral disturbance: Secondary | ICD-10-CM | POA: Diagnosis not present

## 2012-10-21 DIAGNOSIS — F0280 Dementia in other diseases classified elsewhere without behavioral disturbance: Secondary | ICD-10-CM | POA: Diagnosis not present

## 2012-10-22 DIAGNOSIS — F0280 Dementia in other diseases classified elsewhere without behavioral disturbance: Secondary | ICD-10-CM | POA: Diagnosis not present

## 2012-10-23 DIAGNOSIS — F0280 Dementia in other diseases classified elsewhere without behavioral disturbance: Secondary | ICD-10-CM | POA: Diagnosis not present

## 2012-10-24 DIAGNOSIS — F0280 Dementia in other diseases classified elsewhere without behavioral disturbance: Secondary | ICD-10-CM | POA: Diagnosis not present

## 2012-10-25 DIAGNOSIS — F0280 Dementia in other diseases classified elsewhere without behavioral disturbance: Secondary | ICD-10-CM | POA: Diagnosis not present

## 2012-10-28 DIAGNOSIS — F0280 Dementia in other diseases classified elsewhere without behavioral disturbance: Secondary | ICD-10-CM | POA: Diagnosis not present

## 2012-10-29 ENCOUNTER — Telehealth: Payer: Self-pay | Admitting: *Deleted

## 2012-10-29 DIAGNOSIS — F0280 Dementia in other diseases classified elsewhere without behavioral disturbance: Secondary | ICD-10-CM | POA: Diagnosis not present

## 2012-10-29 DIAGNOSIS — G309 Alzheimer's disease, unspecified: Secondary | ICD-10-CM

## 2012-10-29 MED ORDER — HYDROCORTISONE 2.5 % EX LOTN: TOPICAL_LOTION | Freq: Three times a day (TID) | CUTANEOUS | Status: DC

## 2012-10-29 NOTE — Telephone Encounter (Signed)
Okay to refill #60gm x 5

## 2012-10-29 NOTE — Telephone Encounter (Signed)
Faxed request asking for HYDROCORTISONE 2.5% LOTION apply tid prn itching, medication not on active med list, please advise, last filled 10/18/2011

## 2012-10-29 NOTE — Telephone Encounter (Signed)
rx sent to pharmacy by e-script  

## 2012-10-30 ENCOUNTER — Telehealth: Payer: Self-pay

## 2012-10-30 ENCOUNTER — Encounter: Payer: Self-pay | Admitting: Radiology

## 2012-10-30 DIAGNOSIS — F0280 Dementia in other diseases classified elsewhere without behavioral disturbance: Secondary | ICD-10-CM | POA: Diagnosis not present

## 2012-10-30 NOTE — Telephone Encounter (Signed)
Christy nurse with [facility] left v/m that pt is resident at [facility] at [facility]; [place] nor Diamantina Monks is sure of date of last BM. Pt is very guarded;stomach is hard, Christy checked pt manually but was unable to remove any stool. Neysa Bonito is not sure enema would be beneficial. Neysa Bonito does not think blockage; pt has bowel sounds. MOM has not been effective and could Neysa Bonito try a suppository. Christy request cb.

## 2012-10-31 ENCOUNTER — Encounter: Payer: Self-pay | Admitting: Internal Medicine

## 2012-10-31 ENCOUNTER — Non-Acute Institutional Stay: Admitting: Internal Medicine

## 2012-10-31 VITALS — BP 140/80 | HR 92 | Temp 98.2°F | Resp 18 | Wt 130.0 lb

## 2012-10-31 DIAGNOSIS — M170 Bilateral primary osteoarthritis of knee: Secondary | ICD-10-CM

## 2012-10-31 DIAGNOSIS — E119 Type 2 diabetes mellitus without complications: Secondary | ICD-10-CM | POA: Diagnosis not present

## 2012-10-31 DIAGNOSIS — F0280 Dementia in other diseases classified elsewhere without behavioral disturbance: Secondary | ICD-10-CM

## 2012-10-31 DIAGNOSIS — K5909 Other constipation: Secondary | ICD-10-CM

## 2012-10-31 DIAGNOSIS — IMO0002 Reserved for concepts with insufficient information to code with codable children: Secondary | ICD-10-CM | POA: Diagnosis not present

## 2012-10-31 DIAGNOSIS — M1710 Unilateral primary osteoarthritis, unspecified knee: Secondary | ICD-10-CM | POA: Diagnosis not present

## 2012-10-31 DIAGNOSIS — R443 Hallucinations, unspecified: Secondary | ICD-10-CM

## 2012-10-31 DIAGNOSIS — K5904 Chronic idiopathic constipation: Secondary | ICD-10-CM

## 2012-10-31 LAB — HM DIABETES FOOT EXAM

## 2012-10-31 NOTE — Assessment & Plan Note (Signed)
Now severe Total care On Hospice

## 2012-10-31 NOTE — Assessment & Plan Note (Signed)
Recent A1c low lantus reduced Sugars fine given the clinical situation Will reduce insulin more if any clinical hypoglycemia

## 2012-10-31 NOTE — Assessment & Plan Note (Signed)
Not evident with the worsening of her dementia

## 2012-10-31 NOTE — Progress Notes (Signed)
Subjective:    Patient ID: Tanya Clayton, female    DOB: May 10, 1935, 77 y.o.   MRN: [id number]  HPI Seen with unit coordinator Debbie---son not here  Has declined considerably No longer walking---just in wheelchair Now on Hospice care  Needs to have all ADLs done, including eating (needs to be fed) Still on toileting program which is successful at times ---but is incontinent  Has been constipated Will make med changes for this  Seems to be in pain Moans at times Resists movement in wheelchair---favoring her knees  Sugars are checked 3 times weekly Usually under 200 and reasonable No hypoglycemic reactions  No recent hallucinations Not really verbal anymore  Current Outpatient Prescriptions on File Prior to Visit  Medication Sig Dispense Refill  . Alcohol Swabs (B-D SINGLE USE SWABS REGULAR) PADS FINGERSTICK BLOOD SUGAR TEST ONCE DAILY.  100 each  11  . glucose blood test strip True Track test strips; pt test blood sugar twice daily and as directed.250.00  100 each  3  . GNP Lancets Super Thin MISC Test sugar twice daily.  100 each  3  . insulin glargine (LANTUS) 100 UNIT/ML injection Inject 10 Units into the skin at bedtime.       Marland Kitchen LORazepam (ATIVAN) 0.5 MG tablet Take 1 tablet (0.5 mg total) by mouth at bedtime. And  1 tablet by mouth twice a day as needed for agitation (limit 2 doses per 24hr)  90 tablet  5  . metFORMIN (GLUCOPHAGE) 500 MG tablet Take 1 tablet (500 mg total) by mouth 2 (two) times daily with a meal.  60 tablet  11  . traMADol (ULTRAM) 50 MG tablet Take 25 mg by mouth 3 (three) times daily.        No current facility-administered medications on file prior to visit.    No Known Allergies  Past Medical History  Diagnosis Date  . Alzheimer's disease 2006  . Breast cancer     Dr Orlie Dakin  . Allergy   . Arthritis   . Diabetes mellitus     Past Surgical History  Procedure Laterality Date  . Mastectomy  2007    Right--with implant  . Abdominal  hysterectomy  1960's    Family History  Problem Relation Age of Onset  . Diabetes Mother     History   Social History  . Marital Status: Widowed    Spouse Name: N/A    Number of Children: 2  . Years of Education: N/A   Occupational History  . Schoolteacher     retired   Social History Main Topics  . Smoking status: Never Smoker   . Smokeless tobacco: Never Used  . Alcohol Use: No  . Drug Use: Not on file  . Sexual Activity: Not on file   Other Topics Concern  . Not on file   Social History Narrative   1 son, 1 step daughter   Has DNR--done 10/10/12         Review of Systems No apparent chest pain Seems to sleep okay most of the time Appetite is fair Weight stable     Objective:   Physical Exam  Constitutional: She appears well-developed and well-nourished.  In wheelchair---obvious pain in knees with movement  Neck: Normal range of motion. Neck supple. No thyromegaly present.  Cardiovascular: Normal rate, regular rhythm, normal heart sounds and intact distal pulses.  Exam reveals no gallop.   No murmur heard. Pulmonary/Chest: Effort normal and breath sounds normal. No respiratory distress. She  has no wheezes. She has no rales.  Abdominal: Soft. There is no tenderness.  Musculoskeletal: She exhibits no edema.  Mild knee swelling-- L>R Tenderness in knees also  Lymphadenopathy:    She has no cervical adenopathy.  Neurological:  Not verbal Doesn't really engage---this is a distinct difference  Skin:  Hypopigmented spot on lower abdomen (where past rash was)  Psychiatric:  Neutral mood Constricted affect (pain?)          Assessment & Plan:

## 2012-10-31 NOTE — Telephone Encounter (Signed)
I am seeing her now I will address the issue with Eunice Blase and leave orders at the [facility]

## 2012-10-31 NOTE — Assessment & Plan Note (Signed)
Worse Will give tramadol regularly and prn

## 2012-10-31 NOTE — Assessment & Plan Note (Signed)
Will start miralax and use prn dulcolax suppository

## 2012-11-01 DIAGNOSIS — F0280 Dementia in other diseases classified elsewhere without behavioral disturbance: Secondary | ICD-10-CM | POA: Diagnosis not present

## 2012-11-02 DIAGNOSIS — F0280 Dementia in other diseases classified elsewhere without behavioral disturbance: Secondary | ICD-10-CM | POA: Diagnosis not present

## 2012-11-08 ENCOUNTER — Telehealth: Payer: Self-pay | Admitting: Internal Medicine

## 2012-11-08 MED ORDER — HYDROCODONE-ACETAMINOPHEN 5-325 MG PO TABS: 1.0000 | ORAL_TABLET | Freq: Three times a day (TID) | ORAL | Status: DC | PRN

## 2012-11-08 NOTE — Telephone Encounter (Signed)
Note from hospice nurse Tramadol not holding pain at times despite regular use  She has no allergies  I will add an order for norco prn for when she seems to be in more pain

## 2012-12-02 DIAGNOSIS — F0280 Dementia in other diseases classified elsewhere without behavioral disturbance: Secondary | ICD-10-CM | POA: Diagnosis not present

## 2012-12-09 ENCOUNTER — Ambulatory Visit: Payer: Self-pay | Admitting: Podiatry

## 2013-01-02 DIAGNOSIS — F0280 Dementia in other diseases classified elsewhere without behavioral disturbance: Secondary | ICD-10-CM | POA: Diagnosis not present

## 2013-01-02 DIAGNOSIS — G309 Alzheimer's disease, unspecified: Secondary | ICD-10-CM | POA: Diagnosis not present

## 2013-01-10 DIAGNOSIS — G309 Alzheimer's disease, unspecified: Secondary | ICD-10-CM

## 2013-01-10 DIAGNOSIS — F0280 Dementia in other diseases classified elsewhere without behavioral disturbance: Secondary | ICD-10-CM | POA: Diagnosis not present

## 2013-02-02 DIAGNOSIS — F0280 Dementia in other diseases classified elsewhere without behavioral disturbance: Secondary | ICD-10-CM | POA: Diagnosis not present

## 2013-02-02 DIAGNOSIS — L89152 Pressure ulcer of sacral region, stage 2: Secondary | ICD-10-CM

## 2013-02-02 DIAGNOSIS — G309 Alzheimer's disease, unspecified: Secondary | ICD-10-CM | POA: Diagnosis not present

## 2013-02-02 HISTORY — DX: Pressure ulcer of sacral region, stage 2: L89.152

## 2013-02-06 ENCOUNTER — Encounter: Payer: Self-pay | Admitting: Family Medicine

## 2013-03-02 DIAGNOSIS — G309 Alzheimer's disease, unspecified: Secondary | ICD-10-CM | POA: Diagnosis not present

## 2013-03-02 DIAGNOSIS — F0280 Dementia in other diseases classified elsewhere without behavioral disturbance: Secondary | ICD-10-CM | POA: Diagnosis not present

## 2013-03-20 ENCOUNTER — Encounter: Payer: Self-pay | Admitting: Internal Medicine

## 2013-03-20 ENCOUNTER — Non-Acute Institutional Stay: Payer: Medicare Other | Admitting: Internal Medicine

## 2013-03-20 VITALS — BP 130/74 | HR 60 | Temp 97.1°F | Resp 16

## 2013-03-20 DIAGNOSIS — F0280 Dementia in other diseases classified elsewhere without behavioral disturbance: Secondary | ICD-10-CM

## 2013-03-20 DIAGNOSIS — G309 Alzheimer's disease, unspecified: Principal | ICD-10-CM

## 2013-03-20 DIAGNOSIS — E119 Type 2 diabetes mellitus without complications: Secondary | ICD-10-CM

## 2013-03-20 DIAGNOSIS — M19039 Primary osteoarthritis, unspecified wrist: Secondary | ICD-10-CM | POA: Diagnosis not present

## 2013-03-20 DIAGNOSIS — M170 Bilateral primary osteoarthritis of knee: Secondary | ICD-10-CM

## 2013-03-20 NOTE — Assessment & Plan Note (Signed)
Good control without hypoglycemia No changes

## 2013-03-20 NOTE — Assessment & Plan Note (Signed)
And hips No longer ambulatory Fair control with tramadol---may need to increase dose

## 2013-03-20 NOTE — Progress Notes (Signed)
Subjective:    Patient ID: Tanya Clayton, female    DOB: [birth year], 78 y.o.   MRN: [id number]  HPI Son is here  Abby the hospice nurse is here also.  Has declined quite a bit No longer walking Staff having a problem with transfers in bed and providing continence care--looking into getting semi electric bed Appetite seems fine but has lost a lot of weight. Takes ensure also Totally incontinent Has to be fed  Usually in good spirits Did have period of apparent pain in hip--this is better now  Sugars are 120-165 No apparent hypoglycemia  Current Outpatient Prescriptions on File Prior to Visit  Medication Sig Dispense Refill  . Alcohol Swabs (B-D SINGLE USE SWABS REGULAR) PADS FINGERSTICK BLOOD SUGAR TEST ONCE DAILY.  100 each  11  . bisacodyl (DULCOLAX) 10 MG suppository Place 10 mg rectally every 3 (three) days as needed for constipation.      Marland Kitchen glucose blood test strip True Track test strips; pt test blood sugar twice daily and as directed.250.00  100 each  3  . GNP Lancets Super Thin MISC Test sugar twice daily.  100 each  3  . insulin glargine (LANTUS) 100 UNIT/ML injection Inject 10 Units into the skin at bedtime.       Marland Kitchen LORazepam (ATIVAN) 0.5 MG tablet Take 1 tablet (0.5 mg total) by mouth at bedtime. And  1 tablet by mouth twice a day as needed for agitation (limit 2 doses per 24hr)  90 tablet  5  . metFORMIN (GLUCOPHAGE) 500 MG tablet Take 1 tablet (500 mg total) by mouth 2 (two) times daily with a meal.  60 tablet  11  . polyethylene glycol (MIRALAX / GLYCOLAX) packet Take 17 g by mouth daily.      . traMADol (ULTRAM) 50 MG tablet Take 25 mg by mouth 3 (three) times daily. And tid prn (within 1 hour of standing dose)      . triamcinolone lotion (KENALOG) 0.1 % Apply 1 application topically 3 (three) times daily as needed.       No current facility-administered medications on file prior to visit.    No Known Allergies  Past Medical History  Diagnosis Date  .  Alzheimer's disease 2006  . Breast cancer     Dr Grayland Ormond  . Allergy   . Arthritis   . Diabetes mellitus   . Sacral decubitus ulcer, stage II 02/2013    Past Surgical History  Procedure Laterality Date  . Mastectomy  2007    Right--with implant  . Abdominal hysterectomy  1960's    Family History  Problem Relation Age of Onset  . Diabetes Mother     History   Social History  . Marital Status: Widowed    Spouse Name: N/A    Number of Children: 2  . Years of Education: N/A   Occupational History  . Schoolteacher     retired   Social History Main Topics  . Smoking status: Never Smoker   . Smokeless tobacco: Never Used  . Alcohol Use: No  . Drug Use: Not on file  . Sexual Activity: Not on file   Other Topics Concern  . Not on file   Social History Narrative   1 son, 1 step daughter   Has DNR--done 10/10/12         Review of Systems Seems to sleep okay Bowels have been okay Has growth on right hip---well defined and movable. Not inflamed Had stage 2  sacral ulcer---healed now. Has Geomat on bed and using hydrocolloid dressing    Objective:   Physical Exam  Constitutional: No distress.  Clearly a lot of wasting  Neck: No thyromegaly present.  Cardiovascular: Normal rate, regular rhythm and normal heart sounds.  Exam reveals no gallop.   No murmur heard. Pulmonary/Chest: Effort normal and breath sounds normal. No respiratory distress. She has no wheezes. She has no rales.  Abdominal: Soft.  Resists exam but not obviously tender  Musculoskeletal: She exhibits no edema.  Lymphadenopathy:    She has no cervical adenopathy.  Neurological:  Tight in extremities and back---seems to be volitional Passive ROM fair---without clear contractures  Psychiatric:  Smiles but limited engagement Only mumbles incoherent speech          Assessment & Plan:

## 2013-03-20 NOTE — Assessment & Plan Note (Signed)
Now severe On hospice Lots of weight loss Discussed considering a change to SNF

## 2013-04-01 ENCOUNTER — Telehealth: Payer: Self-pay

## 2013-04-01 NOTE — Telephone Encounter (Signed)
Relevant patient education mailed to patient.  

## 2013-04-02 DIAGNOSIS — F0280 Dementia in other diseases classified elsewhere without behavioral disturbance: Secondary | ICD-10-CM | POA: Diagnosis not present

## 2013-04-02 DIAGNOSIS — G309 Alzheimer's disease, unspecified: Secondary | ICD-10-CM | POA: Diagnosis not present

## 2013-04-03 IMAGING — CT CT HEAD WITHOUT CONTRAST
1 series · 16 of 30 positions shown, 20 images · non-contrast
Comparison: none

REASON FOR EXAM: episode of unresponsiveness
COMMENTS:

PROCEDURE:     CT  - CT HEAD WITHOUT CONTRAST  - June 16, 2011 [DATE]
RESULT:     Comparison:  None
TECHNIQUE: Multiple axial images from the foramen magnum to the vertex were
obtained without IV contrast.

[Series 2: soft tissue · axial · 0.40mm/px · z∈[-256,-112]mm · 16 of 33 slices shown, 20 images]
[im 2/33  brain]
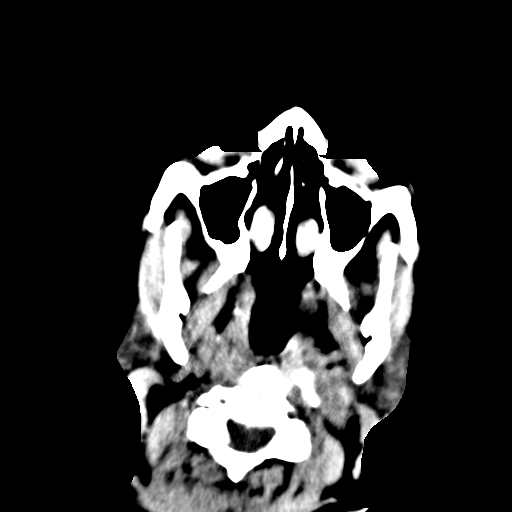
[im 2/33  bone]
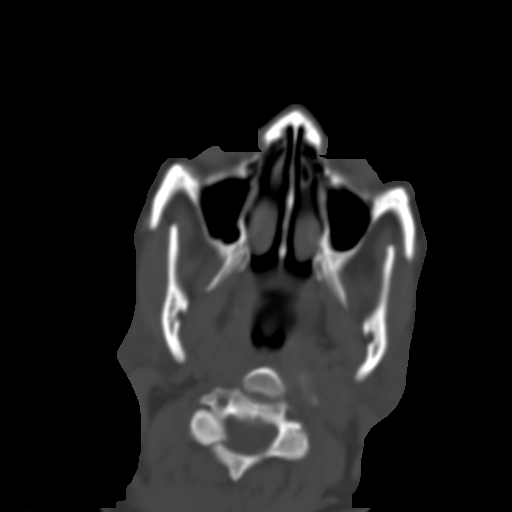
[im 4/33  brain]
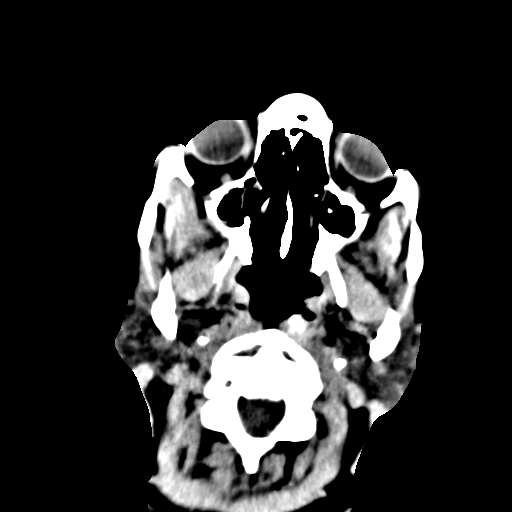
[im 6/33  brain]
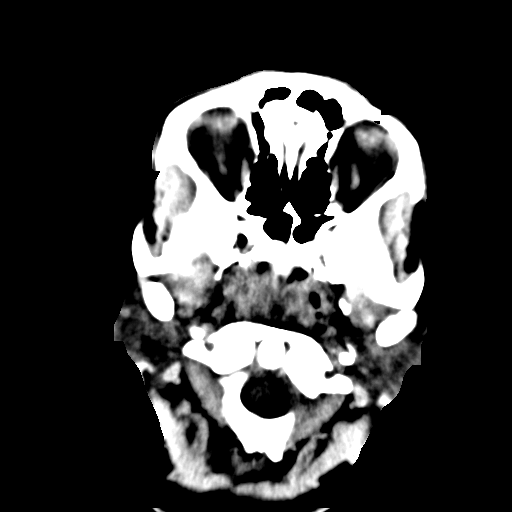
[im 8/33  brain]
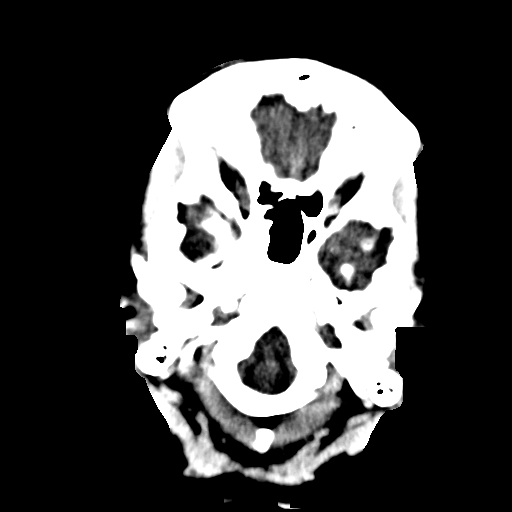
[im 9/33  brain]
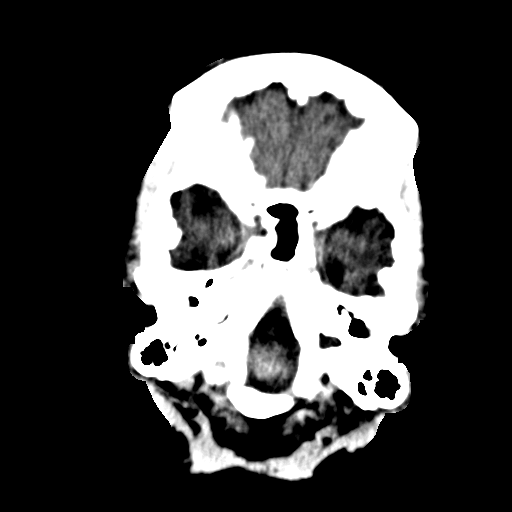
[im 9/33  bone]
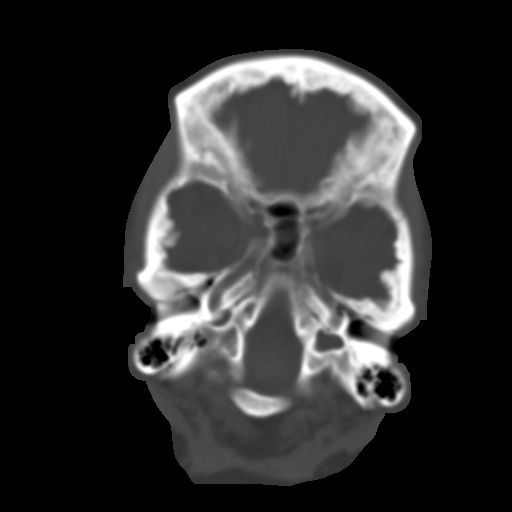
[im 12/33  brain]
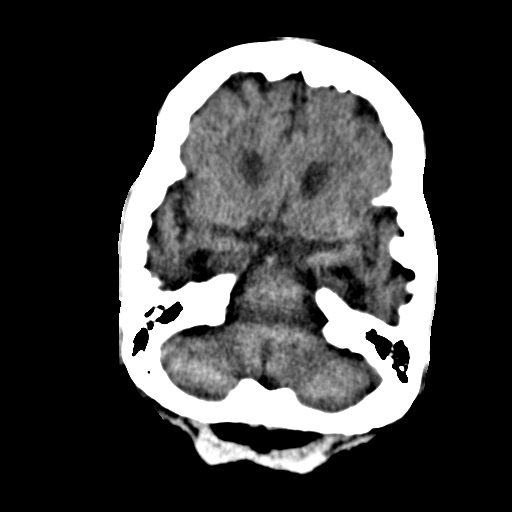
[im 14/33  brain]
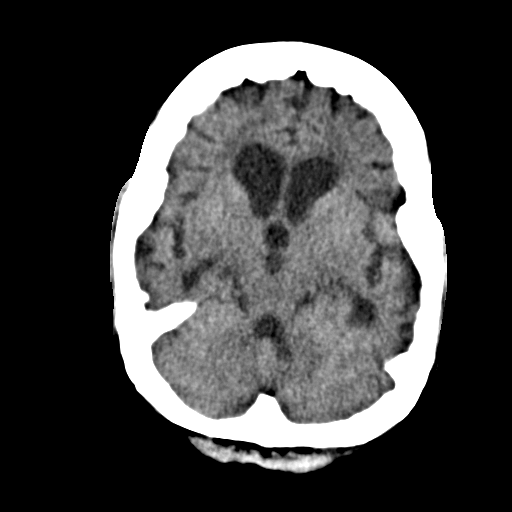
[im 16/33  brain]
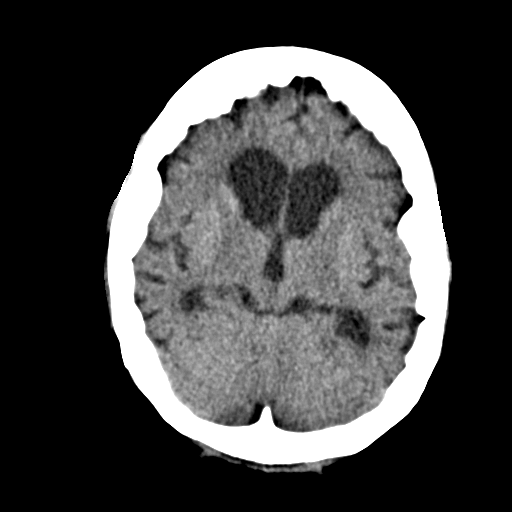
[im 17/33  brain]
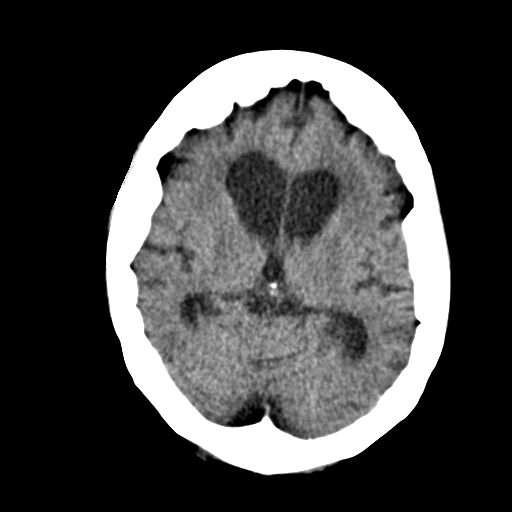
[im 17/33  bone]
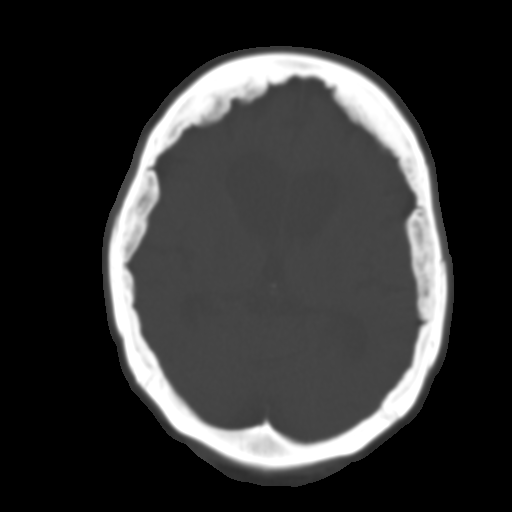
[im 19/33  brain]
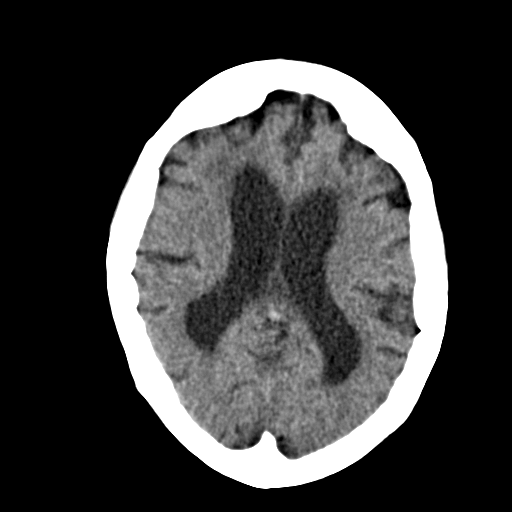
[im 21/33  brain]
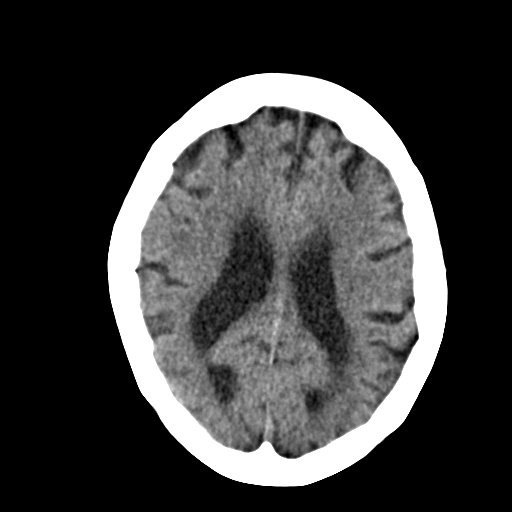
[im 24/33  brain]
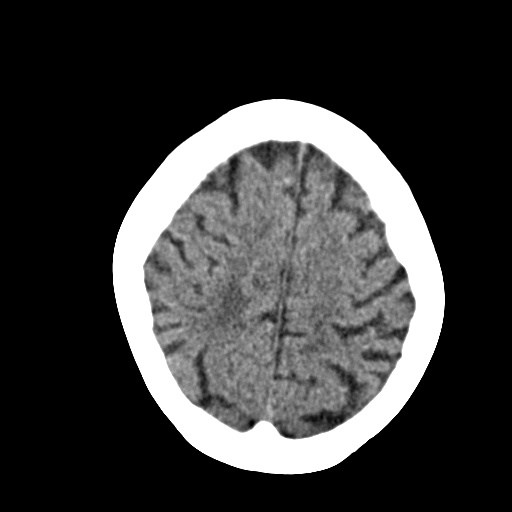
[im 25/33  brain]
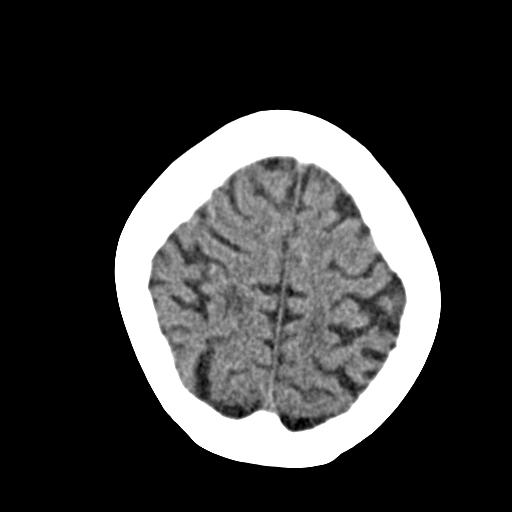
[im 25/33  bone]
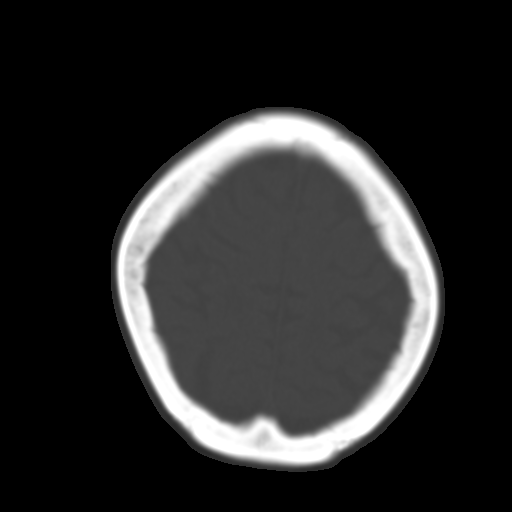
[im 27/33  brain]
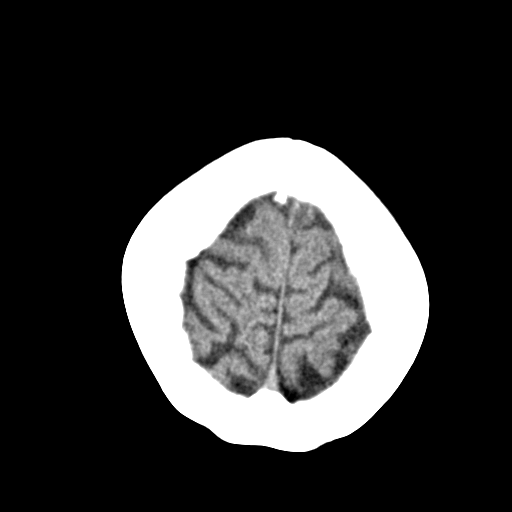
[im 29/33  brain]
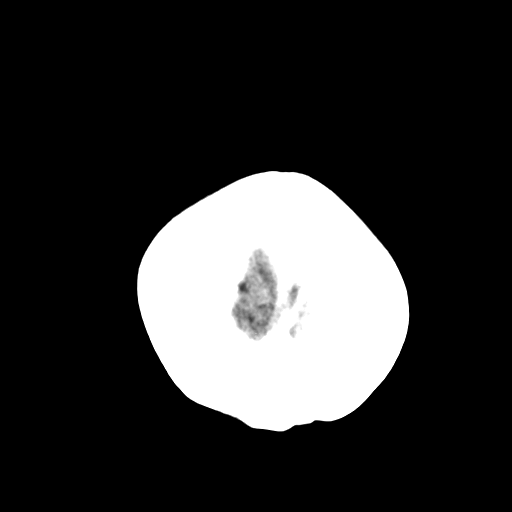
[im 31/33  brain]
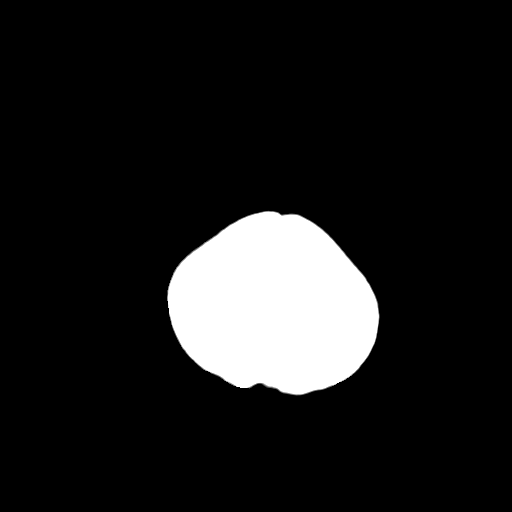

[16 of 30 positions shown; findings below may reference images not displayed]

FINDINGS: There is no evidence of mass effect, midline shift, or extra-axial fluid
collections.  There is no evidence of a space-occupying lesion or
intracranial hemorrhage. There is no evidence of a cortical-based area of
acute infarction. There is generalized cerebral atrophy. There is
periventricular white matter low attenuation likely secondary to
microangiopathy.

The ventricles and sulci are appropriate for the patient's age. The basal
cisterns are patent.

Visualized portions of the orbits are unremarkable. The visualized portions
of the paranasal sinuses and mastoid air cells are unremarkable.

The osseous structures are unremarkable.
IMPRESSION: No acute intracranial process.

[REDACTED]

## 2013-04-11 DIAGNOSIS — G309 Alzheimer's disease, unspecified: Secondary | ICD-10-CM

## 2013-04-11 DIAGNOSIS — F0280 Dementia in other diseases classified elsewhere without behavioral disturbance: Secondary | ICD-10-CM | POA: Diagnosis not present

## 2013-04-21 ENCOUNTER — Other Ambulatory Visit: Payer: Self-pay | Admitting: Internal Medicine

## 2013-04-23 ENCOUNTER — Other Ambulatory Visit: Payer: Self-pay | Admitting: *Deleted

## 2013-04-23 NOTE — Telephone Encounter (Signed)
03/10/13 

## 2013-04-24 MED ORDER — LORAZEPAM 0.5 MG PO TABS: 0.5000 mg | ORAL_TABLET | Freq: Every day | ORAL | Status: DC

## 2013-04-24 NOTE — Telephone Encounter (Signed)
rx called into pharmacy

## 2013-04-24 NOTE — Telephone Encounter (Signed)
Okay #90 x 5-- at [employer] at [facility]

## 2013-05-02 DIAGNOSIS — F0280 Dementia in other diseases classified elsewhere without behavioral disturbance: Secondary | ICD-10-CM | POA: Diagnosis not present

## 2013-05-14 ENCOUNTER — Encounter: Payer: Self-pay | Admitting: Internal Medicine

## 2013-06-02 DIAGNOSIS — G309 Alzheimer's disease, unspecified: Secondary | ICD-10-CM | POA: Diagnosis not present

## 2013-06-02 DIAGNOSIS — F0280 Dementia in other diseases classified elsewhere without behavioral disturbance: Secondary | ICD-10-CM | POA: Diagnosis not present

## 2013-06-23 DIAGNOSIS — G309 Alzheimer's disease, unspecified: Secondary | ICD-10-CM

## 2013-06-23 DIAGNOSIS — F0280 Dementia in other diseases classified elsewhere without behavioral disturbance: Secondary | ICD-10-CM | POA: Diagnosis not present

## 2013-07-02 DIAGNOSIS — F0280 Dementia in other diseases classified elsewhere without behavioral disturbance: Secondary | ICD-10-CM | POA: Diagnosis not present

## 2013-07-02 DIAGNOSIS — G309 Alzheimer's disease, unspecified: Secondary | ICD-10-CM | POA: Diagnosis not present

## 2013-07-09 ENCOUNTER — Other Ambulatory Visit: Payer: Self-pay | Admitting: *Deleted

## 2013-07-09 MED ORDER — UNISTIK 3 COMFORT MISC: Status: DC

## 2013-08-02 DIAGNOSIS — R634 Abnormal weight loss: Secondary | ICD-10-CM | POA: Diagnosis not present

## 2013-08-02 DIAGNOSIS — F0280 Dementia in other diseases classified elsewhere without behavioral disturbance: Secondary | ICD-10-CM | POA: Diagnosis not present

## 2013-08-02 DIAGNOSIS — Z681 Body mass index (BMI) 19 or less, adult: Secondary | ICD-10-CM | POA: Diagnosis not present

## 2013-08-07 ENCOUNTER — Non-Acute Institutional Stay: Payer: Medicare Other | Admitting: Internal Medicine

## 2013-08-07 ENCOUNTER — Encounter: Payer: Self-pay | Admitting: Internal Medicine

## 2013-08-07 VITALS — BP 95/57 | HR 69 | Temp 98.1°F | Resp 18

## 2013-08-07 DIAGNOSIS — G309 Alzheimer's disease, unspecified: Principal | ICD-10-CM

## 2013-08-07 DIAGNOSIS — E43 Unspecified severe protein-calorie malnutrition: Secondary | ICD-10-CM

## 2013-08-07 DIAGNOSIS — M1710 Unilateral primary osteoarthritis, unspecified knee: Secondary | ICD-10-CM

## 2013-08-07 DIAGNOSIS — F0280 Dementia in other diseases classified elsewhere without behavioral disturbance: Secondary | ICD-10-CM

## 2013-08-07 DIAGNOSIS — E119 Type 2 diabetes mellitus without complications: Secondary | ICD-10-CM

## 2013-08-07 DIAGNOSIS — M170 Bilateral primary osteoarthritis of knee: Secondary | ICD-10-CM

## 2013-08-07 NOTE — Assessment & Plan Note (Signed)
Doing okay without sig pain on nightly tramadol Hasn't needed the prn recently

## 2013-08-07 NOTE — Assessment & Plan Note (Signed)
Related to her Alzheimers She eats okay Hospice and facility staff encourage her but still with wasting

## 2013-08-07 NOTE — Progress Notes (Signed)
Subjective:    Patient ID: Tanya Clayton, female    DOB: 03/06/1935, 78 y.o.   MRN: [id number]  HPI Seen with Debbie--the unit coordinator Son out of town  Continues to decline On hospice care still No longer seems to be able to communicate---will occasionally answer yes or no that may be appropriate but no other talking  Still walks with assistance sometimes Other times, will not straighten legs enough to stand (and needs wheelchair) Generally won't get up on her own Was found on the floor by her bed once--no injury Total care now Incontinent despite efforts to bring her to toilet No apparent hallucinations or delusions  Did have one spell of vomiting May have overate at breakfast Sugars generally fine 112 fasting last check No hypoglycemia and sugars fine off the insulin  Eats okay but continues to lose weight Unable to stand to weigh but clearly cachectic now  No apparent pain in knees Hasn't needed tramadol  Current Outpatient Prescriptions on File Prior to Visit  Medication Sig Dispense Refill  . Alcohol Swabs (B-D SINGLE USE SWABS REGULAR) PADS FINGERSTICK BLOOD SUGAR TEST ONCE DAILY.  100 each  11  . bisacodyl (DULCOLAX) 10 MG suppository Place 10 mg rectally every 3 (three) days as needed for constipation.      Marland Kitchen glucose blood test strip True Track test strips; pt test blood sugar twice daily and as directed.250.00  100 each  3  . GNP Lancets Super Thin MISC Test sugar twice daily.  100 each  3  . Lancets Misc. (UNISTIK 3 COMFORT) MISC Fingerstick blood sugar test once daily  100 each  1  . LORazepam (ATIVAN) 0.5 MG tablet Take 1 tablet (0.5 mg total) by mouth at bedtime. And  1 tablet by mouth twice a day as needed for agitation (limit 2 doses per 24hr)  90 tablet  5  . metFORMIN (GLUCOPHAGE) 500 MG tablet Take 1 tablet (500 mg total) by mouth 2 (two) times daily with a meal.  60 tablet  11  . MONOJECT INS SYR .5CC/30G 30G X 5/16" 0.5 ML MISC USE AS DIRECTED  100  each  PRN  . polyethylene glycol (MIRALAX / GLYCOLAX) packet Take 17 g by mouth daily.      . traMADol (ULTRAM) 50 MG tablet Take 50 mg by mouth at bedtime. And tid prn (within 1 hour of standing dose)      . triamcinolone lotion (KENALOG) 0.1 % Apply 1 application topically 3 (three) times daily as needed.       No current facility-administered medications on file prior to visit.    No Known Allergies  Past Medical History  Diagnosis Date  . Alzheimer's disease 2006  . Breast cancer     Dr Grayland Ormond  . Allergy   . Arthritis   . Diabetes mellitus   . Sacral decubitus ulcer, stage II 02/2013    Past Surgical History  Procedure Laterality Date  . Mastectomy  2007    Right--with implant  . Abdominal hysterectomy  1960's    Family History  Problem Relation Age of Onset  . Diabetes Mother     History   Social History  . Marital Status: Widowed    Spouse Name: N/A    Number of Children: 2  . Years of Education: N/A   Occupational History  . Schoolteacher     retired   Social History Main Topics  . Smoking status: Never Smoker   . Smokeless tobacco: Never  Used  . Alcohol Use: No  . Drug Use: Not on file  . Sexual Activity: Not on file   Other Topics Concern  . Not on file   Social History Narrative   1 son, 1 step daughter   Has DNR--done 10/10/12         Review of Systems Sleeps okay Mood seems to be upbeat still --will smile regularly Bowels okay No apparent chest pain or SOB     Objective:   Physical Exam  Constitutional: No distress.  Clear muscle wasting and weight loss  Neck: Normal range of motion. No thyromegaly present.  Cardiovascular: Normal rate and regular rhythm.  Exam reveals no gallop.   Soft systolic murmur along left sternal border   Pulmonary/Chest: Effort normal. No respiratory distress. She has no wheezes. She has no rales.  Decreased breath sounds but clear  Abdominal: Soft. There is no tenderness.  Musculoskeletal: She  exhibits no edema and no tenderness.  Lymphadenopathy:    She has no cervical adenopathy.  Neurological:  Needs assist to stand and walk Doesn't full straighten knees--but no obvious increased tone Irregular one word answers ----no evidence of comprehension  Psychiatric:  Mood is neutral ---will engage with smile          Assessment & Plan:

## 2013-08-07 NOTE — Assessment & Plan Note (Signed)
Severe Just about total care and incontinent Managing her in [place] with assist of Hospice

## 2013-08-07 NOTE — Assessment & Plan Note (Signed)
Sugars are fine on just metformin No hypoglycemia now off the insulin

## 2013-08-12 DIAGNOSIS — G309 Alzheimer's disease, unspecified: Secondary | ICD-10-CM

## 2013-08-12 DIAGNOSIS — F0280 Dementia in other diseases classified elsewhere without behavioral disturbance: Secondary | ICD-10-CM | POA: Diagnosis not present

## 2013-08-20 ENCOUNTER — Other Ambulatory Visit: Payer: Self-pay | Admitting: *Deleted

## 2013-08-20 MED ORDER — TRAMADOL HCL 50 MG PO TABS: 50.0000 mg | ORAL_TABLET | Freq: Every day | ORAL | Status: DC

## 2013-08-20 NOTE — Telephone Encounter (Signed)
Form on your desk  

## 2013-08-20 NOTE — Telephone Encounter (Signed)
Approved #90 x 5

## 2013-08-20 NOTE — Telephone Encounter (Signed)
rx faxed to pharmacy manually  

## 2013-09-02 DIAGNOSIS — R634 Abnormal weight loss: Secondary | ICD-10-CM | POA: Diagnosis not present

## 2013-09-02 DIAGNOSIS — F0280 Dementia in other diseases classified elsewhere without behavioral disturbance: Secondary | ICD-10-CM | POA: Diagnosis not present

## 2013-09-02 DIAGNOSIS — Z681 Body mass index (BMI) 19 or less, adult: Secondary | ICD-10-CM | POA: Diagnosis not present

## 2013-09-23 ENCOUNTER — Other Ambulatory Visit: Payer: Self-pay | Admitting: Internal Medicine

## 2013-10-02 DIAGNOSIS — R634 Abnormal weight loss: Secondary | ICD-10-CM | POA: Diagnosis not present

## 2013-10-02 DIAGNOSIS — Z681 Body mass index (BMI) 19 or less, adult: Secondary | ICD-10-CM | POA: Diagnosis not present

## 2013-10-02 DIAGNOSIS — M1990 Unspecified osteoarthritis, unspecified site: Secondary | ICD-10-CM | POA: Diagnosis not present

## 2013-10-02 DIAGNOSIS — F0280 Dementia in other diseases classified elsewhere without behavioral disturbance: Secondary | ICD-10-CM | POA: Diagnosis not present

## 2013-10-02 DIAGNOSIS — G309 Alzheimer's disease, unspecified: Secondary | ICD-10-CM | POA: Diagnosis not present

## 2013-10-02 DIAGNOSIS — J3089 Other allergic rhinitis: Secondary | ICD-10-CM | POA: Diagnosis not present

## 2013-10-09 DIAGNOSIS — R634 Abnormal weight loss: Secondary | ICD-10-CM | POA: Diagnosis not present

## 2013-10-09 DIAGNOSIS — G309 Alzheimer's disease, unspecified: Secondary | ICD-10-CM | POA: Diagnosis not present

## 2013-10-09 DIAGNOSIS — Z681 Body mass index (BMI) 19 or less, adult: Secondary | ICD-10-CM | POA: Diagnosis not present

## 2013-10-09 DIAGNOSIS — F0280 Dementia in other diseases classified elsewhere without behavioral disturbance: Secondary | ICD-10-CM | POA: Diagnosis not present

## 2013-10-09 DIAGNOSIS — Z23 Encounter for immunization: Secondary | ICD-10-CM | POA: Diagnosis not present

## 2013-11-02 DIAGNOSIS — R634 Abnormal weight loss: Secondary | ICD-10-CM | POA: Diagnosis not present

## 2013-11-02 DIAGNOSIS — F0280 Dementia in other diseases classified elsewhere without behavioral disturbance: Secondary | ICD-10-CM | POA: Diagnosis not present

## 2013-11-02 DIAGNOSIS — G309 Alzheimer's disease, unspecified: Secondary | ICD-10-CM | POA: Diagnosis not present

## 2013-11-02 DIAGNOSIS — M1990 Unspecified osteoarthritis, unspecified site: Secondary | ICD-10-CM | POA: Diagnosis not present

## 2013-11-02 DIAGNOSIS — J3089 Other allergic rhinitis: Secondary | ICD-10-CM | POA: Diagnosis not present

## 2013-11-02 DIAGNOSIS — Z681 Body mass index (BMI) 19 or less, adult: Secondary | ICD-10-CM | POA: Diagnosis not present

## 2013-11-11 ENCOUNTER — Encounter: Payer: Self-pay | Admitting: Internal Medicine

## 2013-12-02 DIAGNOSIS — M1990 Unspecified osteoarthritis, unspecified site: Secondary | ICD-10-CM | POA: Diagnosis not present

## 2013-12-02 DIAGNOSIS — F0280 Dementia in other diseases classified elsewhere without behavioral disturbance: Secondary | ICD-10-CM | POA: Diagnosis not present

## 2013-12-02 DIAGNOSIS — Z681 Body mass index (BMI) 19 or less, adult: Secondary | ICD-10-CM | POA: Diagnosis not present

## 2013-12-02 DIAGNOSIS — R634 Abnormal weight loss: Secondary | ICD-10-CM | POA: Diagnosis not present

## 2013-12-02 DIAGNOSIS — J3089 Other allergic rhinitis: Secondary | ICD-10-CM | POA: Diagnosis not present

## 2013-12-02 DIAGNOSIS — G309 Alzheimer's disease, unspecified: Secondary | ICD-10-CM | POA: Diagnosis not present

## 2013-12-11 DIAGNOSIS — G309 Alzheimer's disease, unspecified: Secondary | ICD-10-CM

## 2013-12-11 DIAGNOSIS — F0280 Dementia in other diseases classified elsewhere without behavioral disturbance: Secondary | ICD-10-CM

## 2013-12-11 DIAGNOSIS — R634 Abnormal weight loss: Secondary | ICD-10-CM

## 2013-12-11 DIAGNOSIS — Z681 Body mass index (BMI) 19 or less, adult: Secondary | ICD-10-CM

## 2014-01-02 DIAGNOSIS — J3089 Other allergic rhinitis: Secondary | ICD-10-CM | POA: Diagnosis not present

## 2014-01-02 DIAGNOSIS — R634 Abnormal weight loss: Secondary | ICD-10-CM | POA: Diagnosis not present

## 2014-01-02 DIAGNOSIS — F0280 Dementia in other diseases classified elsewhere without behavioral disturbance: Secondary | ICD-10-CM | POA: Diagnosis not present

## 2014-01-02 DIAGNOSIS — G309 Alzheimer's disease, unspecified: Secondary | ICD-10-CM | POA: Diagnosis not present

## 2014-01-02 DIAGNOSIS — Z681 Body mass index (BMI) 19 or less, adult: Secondary | ICD-10-CM | POA: Diagnosis not present

## 2014-01-02 DIAGNOSIS — M1990 Unspecified osteoarthritis, unspecified site: Secondary | ICD-10-CM | POA: Diagnosis not present

## 2014-01-05 DIAGNOSIS — Z681 Body mass index (BMI) 19 or less, adult: Secondary | ICD-10-CM | POA: Diagnosis not present

## 2014-01-05 DIAGNOSIS — F0280 Dementia in other diseases classified elsewhere without behavioral disturbance: Secondary | ICD-10-CM | POA: Diagnosis not present

## 2014-01-05 DIAGNOSIS — G309 Alzheimer's disease, unspecified: Secondary | ICD-10-CM | POA: Diagnosis not present

## 2014-01-05 DIAGNOSIS — M1990 Unspecified osteoarthritis, unspecified site: Secondary | ICD-10-CM | POA: Diagnosis not present

## 2014-01-05 DIAGNOSIS — J3089 Other allergic rhinitis: Secondary | ICD-10-CM | POA: Diagnosis not present

## 2014-01-05 DIAGNOSIS — R634 Abnormal weight loss: Secondary | ICD-10-CM | POA: Diagnosis not present

## 2014-01-06 DIAGNOSIS — Z681 Body mass index (BMI) 19 or less, adult: Secondary | ICD-10-CM | POA: Diagnosis not present

## 2014-01-06 DIAGNOSIS — J3089 Other allergic rhinitis: Secondary | ICD-10-CM | POA: Diagnosis not present

## 2014-01-06 DIAGNOSIS — F0280 Dementia in other diseases classified elsewhere without behavioral disturbance: Secondary | ICD-10-CM | POA: Diagnosis not present

## 2014-01-06 DIAGNOSIS — R634 Abnormal weight loss: Secondary | ICD-10-CM | POA: Diagnosis not present

## 2014-01-06 DIAGNOSIS — M1990 Unspecified osteoarthritis, unspecified site: Secondary | ICD-10-CM | POA: Diagnosis not present

## 2014-01-06 DIAGNOSIS — G309 Alzheimer's disease, unspecified: Secondary | ICD-10-CM | POA: Diagnosis not present

## 2014-01-07 DIAGNOSIS — G309 Alzheimer's disease, unspecified: Secondary | ICD-10-CM | POA: Diagnosis not present

## 2014-01-07 DIAGNOSIS — Z681 Body mass index (BMI) 19 or less, adult: Secondary | ICD-10-CM | POA: Diagnosis not present

## 2014-01-07 DIAGNOSIS — J3089 Other allergic rhinitis: Secondary | ICD-10-CM | POA: Diagnosis not present

## 2014-01-07 DIAGNOSIS — R634 Abnormal weight loss: Secondary | ICD-10-CM | POA: Diagnosis not present

## 2014-01-07 DIAGNOSIS — M1990 Unspecified osteoarthritis, unspecified site: Secondary | ICD-10-CM | POA: Diagnosis not present

## 2014-01-07 DIAGNOSIS — F0280 Dementia in other diseases classified elsewhere without behavioral disturbance: Secondary | ICD-10-CM | POA: Diagnosis not present

## 2014-01-08 ENCOUNTER — Encounter: Payer: Self-pay | Admitting: Internal Medicine

## 2014-01-08 ENCOUNTER — Non-Acute Institutional Stay: Payer: Medicare Other | Admitting: Internal Medicine

## 2014-01-08 VITALS — BP 118/70 | HR 72 | Resp 18

## 2014-01-08 DIAGNOSIS — F0280 Dementia in other diseases classified elsewhere without behavioral disturbance: Secondary | ICD-10-CM | POA: Diagnosis not present

## 2014-01-08 DIAGNOSIS — M170 Bilateral primary osteoarthritis of knee: Secondary | ICD-10-CM

## 2014-01-08 DIAGNOSIS — G309 Alzheimer's disease, unspecified: Secondary | ICD-10-CM | POA: Diagnosis not present

## 2014-01-08 DIAGNOSIS — R634 Abnormal weight loss: Secondary | ICD-10-CM | POA: Diagnosis not present

## 2014-01-08 DIAGNOSIS — Z681 Body mass index (BMI) 19 or less, adult: Secondary | ICD-10-CM | POA: Diagnosis not present

## 2014-01-08 DIAGNOSIS — E119 Type 2 diabetes mellitus without complications: Secondary | ICD-10-CM

## 2014-01-08 DIAGNOSIS — E43 Unspecified severe protein-calorie malnutrition: Secondary | ICD-10-CM

## 2014-01-08 DIAGNOSIS — R443 Hallucinations, unspecified: Secondary | ICD-10-CM

## 2014-01-08 DIAGNOSIS — J3089 Other allergic rhinitis: Secondary | ICD-10-CM | POA: Diagnosis not present

## 2014-01-08 DIAGNOSIS — M1990 Unspecified osteoarthritis, unspecified site: Secondary | ICD-10-CM | POA: Diagnosis not present

## 2014-01-08 MED ORDER — TRAMADOL HCL 50 MG PO TABS: 25.0000 mg | ORAL_TABLET | Freq: Every day | ORAL | Status: DC

## 2014-01-08 MED ORDER — LORAZEPAM 0.5 MG PO TABS: 0.5000 mg | ORAL_TABLET | Freq: Three times a day (TID) | ORAL | Status: DC | PRN

## 2014-01-08 NOTE — Assessment & Plan Note (Signed)
Despite eating fairly well Seems to have stabilized now

## 2014-01-08 NOTE — Assessment & Plan Note (Signed)
Well controlled on low dose metformin May be able to stop this over time as well

## 2014-01-08 NOTE — Progress Notes (Signed)
Subjective:    Patient ID: Tanya Clayton, female    DOB: 11-01-1935, 79 y.o.   MRN: [id number]  HPI Seen with Debbie--unit coordinator for follow up of her dementia Seems to have stabilized somewhat Still on hospice  Able to walk some---up with assist of 2 and can walk with 1 other person Not having much arthritis pain---tramadol just at bedtime  Sleeping fine Still on nightly lorazepam--- not clear she needs it Doesn't seem as fretful in general Hasn't needed prn doses  Appetite is fair Weight seems to have stabilized---but very hard to check her  Sugars checked weekly Last 102 and generally good No hypoglycemic reactions  Remains incontinent Total care for dressing and bathroom Needs to be fed  Current Outpatient Prescriptions on File Prior to Visit  Medication Sig Dispense Refill  . Alcohol Swabs (B-D SINGLE USE SWABS REGULAR) PADS FINGERSTICK BLOOD SUGAR TEST ONCE DAILY. 100 each 11  . bisacodyl (DULCOLAX) 10 MG suppository Place 10 mg rectally every 3 (three) days as needed for constipation.    Marland Kitchen glucose blood test strip True Track test strips; pt test blood sugar twice daily and as directed.250.00 100 each 3  . Lancets Misc. (UNISTIK 3 COMFORT) MISC Fingerstick blood sugar test once daily 100 each 1  . LORazepam (ATIVAN) 0.5 MG tablet Take 1 tablet (0.5 mg total) by mouth at bedtime. And  1 tablet by mouth twice a day as needed for agitation (limit 2 doses per 24hr) 90 tablet 5  . metFORMIN (GLUCOPHAGE) 500 MG tablet Take 500 mg by mouth daily with breakfast.    . MONOJECT INS SYR .5CC/30G 30G X 5/16" 0.5 ML MISC USE AS DIRECTED 100 each PRN  . polyethylene glycol (MIRALAX / GLYCOLAX) packet Take 17 g by mouth daily.    Marland Kitchen triamcinolone lotion (KENALOG) 0.1 % Apply 1 application topically 3 (three) times daily as needed.     No current facility-administered medications on file prior to visit.    No Known Allergies  Past Medical History  Diagnosis Date  .  Alzheimer's disease 2006  . Breast cancer     Dr Grayland Ormond  . Allergy   . Arthritis   . Diabetes mellitus   . Sacral decubitus ulcer, stage II 02/2013    Past Surgical History  Procedure Laterality Date  . Mastectomy  2007    Right--with implant  . Abdominal hysterectomy  1960's    Family History  Problem Relation Age of Onset  . Diabetes Mother     History   Social History  . Marital Status: Widowed    Spouse Name: N/A    Number of Children: 2  . Years of Education: N/A   Occupational History  . Schoolteacher     retired   Social History Main Topics  . Smoking status: Never Smoker   . Smokeless tobacco: Never Used  . Alcohol Use: No  . Drug Use: Not on file  . Sexual Activity: Not on file   Other Topics Concern  . Not on file   Social History Narrative   1 son, 1 step daughter   Has DNR--done 10/10/12         Review of Systems Still with mass above left breast---may be pacemaker (that I didn't have a record of) No skin breakdown Bowels are okay    Objective:   Physical Exam  Constitutional: No distress.  Still with clear wasting but no worse than last time  Neck: No thyromegaly present.  Cardiovascular: Normal rate, regular rhythm and normal heart sounds.  Exam reveals no gallop.   No murmur heard. Pulmonary/Chest: Effort normal and breath sounds normal. No respiratory distress. She has no wheezes. She has no rales.  Abdominal: Soft. There is no tenderness.  Musculoskeletal: She exhibits no edema or tenderness.  Lymphadenopathy:    She has no cervical adenopathy.  Skin: No rash noted.  Psychiatric:  Engages a little, very few words Seems satisfied          Assessment & Plan:

## 2014-01-08 NOTE — Assessment & Plan Note (Signed)
Still severe but seems to have stabilized Continues on hospice Walks only with sig assist Total care otherwise

## 2014-01-08 NOTE — Assessment & Plan Note (Signed)
Not apparent Sleeping better---will stop regular lorazepam and only keep prn order

## 2014-01-08 NOTE — Assessment & Plan Note (Signed)
Seems better Will continue tramadol at night for now Reconsider next time

## 2014-01-09 DIAGNOSIS — G309 Alzheimer's disease, unspecified: Secondary | ICD-10-CM | POA: Diagnosis not present

## 2014-01-09 DIAGNOSIS — M1990 Unspecified osteoarthritis, unspecified site: Secondary | ICD-10-CM | POA: Diagnosis not present

## 2014-01-09 DIAGNOSIS — F0280 Dementia in other diseases classified elsewhere without behavioral disturbance: Secondary | ICD-10-CM | POA: Diagnosis not present

## 2014-01-09 DIAGNOSIS — Z681 Body mass index (BMI) 19 or less, adult: Secondary | ICD-10-CM | POA: Diagnosis not present

## 2014-01-09 DIAGNOSIS — R634 Abnormal weight loss: Secondary | ICD-10-CM | POA: Diagnosis not present

## 2014-01-09 DIAGNOSIS — J3089 Other allergic rhinitis: Secondary | ICD-10-CM | POA: Diagnosis not present

## 2014-01-12 DIAGNOSIS — J3089 Other allergic rhinitis: Secondary | ICD-10-CM | POA: Diagnosis not present

## 2014-01-12 DIAGNOSIS — F0280 Dementia in other diseases classified elsewhere without behavioral disturbance: Secondary | ICD-10-CM | POA: Diagnosis not present

## 2014-01-12 DIAGNOSIS — G309 Alzheimer's disease, unspecified: Secondary | ICD-10-CM | POA: Diagnosis not present

## 2014-01-12 DIAGNOSIS — R634 Abnormal weight loss: Secondary | ICD-10-CM | POA: Diagnosis not present

## 2014-01-12 DIAGNOSIS — Z681 Body mass index (BMI) 19 or less, adult: Secondary | ICD-10-CM | POA: Diagnosis not present

## 2014-01-12 DIAGNOSIS — M1990 Unspecified osteoarthritis, unspecified site: Secondary | ICD-10-CM | POA: Diagnosis not present

## 2014-01-13 DIAGNOSIS — R634 Abnormal weight loss: Secondary | ICD-10-CM | POA: Diagnosis not present

## 2014-01-13 DIAGNOSIS — G309 Alzheimer's disease, unspecified: Secondary | ICD-10-CM | POA: Diagnosis not present

## 2014-01-13 DIAGNOSIS — F0280 Dementia in other diseases classified elsewhere without behavioral disturbance: Secondary | ICD-10-CM | POA: Diagnosis not present

## 2014-01-13 DIAGNOSIS — J3089 Other allergic rhinitis: Secondary | ICD-10-CM | POA: Diagnosis not present

## 2014-01-13 DIAGNOSIS — M1990 Unspecified osteoarthritis, unspecified site: Secondary | ICD-10-CM | POA: Diagnosis not present

## 2014-01-13 DIAGNOSIS — Z681 Body mass index (BMI) 19 or less, adult: Secondary | ICD-10-CM | POA: Diagnosis not present

## 2014-01-14 DIAGNOSIS — Z681 Body mass index (BMI) 19 or less, adult: Secondary | ICD-10-CM | POA: Diagnosis not present

## 2014-01-14 DIAGNOSIS — M1990 Unspecified osteoarthritis, unspecified site: Secondary | ICD-10-CM | POA: Diagnosis not present

## 2014-01-14 DIAGNOSIS — R634 Abnormal weight loss: Secondary | ICD-10-CM | POA: Diagnosis not present

## 2014-01-14 DIAGNOSIS — F0280 Dementia in other diseases classified elsewhere without behavioral disturbance: Secondary | ICD-10-CM | POA: Diagnosis not present

## 2014-01-14 DIAGNOSIS — J3089 Other allergic rhinitis: Secondary | ICD-10-CM | POA: Diagnosis not present

## 2014-01-14 DIAGNOSIS — G309 Alzheimer's disease, unspecified: Secondary | ICD-10-CM | POA: Diagnosis not present

## 2014-01-15 ENCOUNTER — Other Ambulatory Visit: Payer: Self-pay | Admitting: *Deleted

## 2014-01-15 DIAGNOSIS — F0280 Dementia in other diseases classified elsewhere without behavioral disturbance: Secondary | ICD-10-CM | POA: Diagnosis not present

## 2014-01-15 DIAGNOSIS — M1990 Unspecified osteoarthritis, unspecified site: Secondary | ICD-10-CM | POA: Diagnosis not present

## 2014-01-15 DIAGNOSIS — R634 Abnormal weight loss: Secondary | ICD-10-CM | POA: Diagnosis not present

## 2014-01-15 DIAGNOSIS — J3089 Other allergic rhinitis: Secondary | ICD-10-CM | POA: Diagnosis not present

## 2014-01-15 DIAGNOSIS — G309 Alzheimer's disease, unspecified: Secondary | ICD-10-CM | POA: Diagnosis not present

## 2014-01-15 DIAGNOSIS — Z681 Body mass index (BMI) 19 or less, adult: Secondary | ICD-10-CM | POA: Diagnosis not present

## 2014-01-15 NOTE — Telephone Encounter (Signed)
roxanol faxed to [employer] [facility]

## 2014-01-16 DIAGNOSIS — M1990 Unspecified osteoarthritis, unspecified site: Secondary | ICD-10-CM | POA: Diagnosis not present

## 2014-01-16 DIAGNOSIS — G309 Alzheimer's disease, unspecified: Secondary | ICD-10-CM | POA: Diagnosis not present

## 2014-01-16 DIAGNOSIS — J3089 Other allergic rhinitis: Secondary | ICD-10-CM | POA: Diagnosis not present

## 2014-01-16 DIAGNOSIS — F0280 Dementia in other diseases classified elsewhere without behavioral disturbance: Secondary | ICD-10-CM | POA: Diagnosis not present

## 2014-01-16 DIAGNOSIS — R634 Abnormal weight loss: Secondary | ICD-10-CM | POA: Diagnosis not present

## 2014-01-16 DIAGNOSIS — Z681 Body mass index (BMI) 19 or less, adult: Secondary | ICD-10-CM | POA: Diagnosis not present

## 2014-01-17 DIAGNOSIS — M1990 Unspecified osteoarthritis, unspecified site: Secondary | ICD-10-CM | POA: Diagnosis not present

## 2014-01-17 DIAGNOSIS — J3089 Other allergic rhinitis: Secondary | ICD-10-CM | POA: Diagnosis not present

## 2014-01-17 DIAGNOSIS — G309 Alzheimer's disease, unspecified: Secondary | ICD-10-CM | POA: Diagnosis not present

## 2014-01-17 DIAGNOSIS — R634 Abnormal weight loss: Secondary | ICD-10-CM | POA: Diagnosis not present

## 2014-01-17 DIAGNOSIS — Z681 Body mass index (BMI) 19 or less, adult: Secondary | ICD-10-CM | POA: Diagnosis not present

## 2014-01-17 DIAGNOSIS — F0280 Dementia in other diseases classified elsewhere without behavioral disturbance: Secondary | ICD-10-CM | POA: Diagnosis not present

## 2014-01-19 DIAGNOSIS — G309 Alzheimer's disease, unspecified: Secondary | ICD-10-CM | POA: Diagnosis not present

## 2014-01-19 DIAGNOSIS — Z681 Body mass index (BMI) 19 or less, adult: Secondary | ICD-10-CM | POA: Diagnosis not present

## 2014-01-19 DIAGNOSIS — R634 Abnormal weight loss: Secondary | ICD-10-CM | POA: Diagnosis not present

## 2014-01-19 DIAGNOSIS — M1990 Unspecified osteoarthritis, unspecified site: Secondary | ICD-10-CM | POA: Diagnosis not present

## 2014-01-19 DIAGNOSIS — F0280 Dementia in other diseases classified elsewhere without behavioral disturbance: Secondary | ICD-10-CM | POA: Diagnosis not present

## 2014-01-19 DIAGNOSIS — J3089 Other allergic rhinitis: Secondary | ICD-10-CM | POA: Diagnosis not present

## 2014-01-20 DIAGNOSIS — F0280 Dementia in other diseases classified elsewhere without behavioral disturbance: Secondary | ICD-10-CM | POA: Diagnosis not present

## 2014-01-20 DIAGNOSIS — R634 Abnormal weight loss: Secondary | ICD-10-CM | POA: Diagnosis not present

## 2014-01-20 DIAGNOSIS — G309 Alzheimer's disease, unspecified: Secondary | ICD-10-CM | POA: Diagnosis not present

## 2014-01-20 DIAGNOSIS — M1990 Unspecified osteoarthritis, unspecified site: Secondary | ICD-10-CM | POA: Diagnosis not present

## 2014-01-20 DIAGNOSIS — Z681 Body mass index (BMI) 19 or less, adult: Secondary | ICD-10-CM | POA: Diagnosis not present

## 2014-01-20 DIAGNOSIS — J3089 Other allergic rhinitis: Secondary | ICD-10-CM | POA: Diagnosis not present

## 2014-01-21 DIAGNOSIS — G309 Alzheimer's disease, unspecified: Secondary | ICD-10-CM | POA: Diagnosis not present

## 2014-01-21 DIAGNOSIS — Z681 Body mass index (BMI) 19 or less, adult: Secondary | ICD-10-CM | POA: Diagnosis not present

## 2014-01-21 DIAGNOSIS — J3089 Other allergic rhinitis: Secondary | ICD-10-CM | POA: Diagnosis not present

## 2014-01-21 DIAGNOSIS — R634 Abnormal weight loss: Secondary | ICD-10-CM | POA: Diagnosis not present

## 2014-01-21 DIAGNOSIS — F0280 Dementia in other diseases classified elsewhere without behavioral disturbance: Secondary | ICD-10-CM | POA: Diagnosis not present

## 2014-01-21 DIAGNOSIS — M1990 Unspecified osteoarthritis, unspecified site: Secondary | ICD-10-CM | POA: Diagnosis not present

## 2014-01-22 DIAGNOSIS — F0280 Dementia in other diseases classified elsewhere without behavioral disturbance: Secondary | ICD-10-CM | POA: Diagnosis not present

## 2014-01-22 DIAGNOSIS — R634 Abnormal weight loss: Secondary | ICD-10-CM | POA: Diagnosis not present

## 2014-01-22 DIAGNOSIS — M1990 Unspecified osteoarthritis, unspecified site: Secondary | ICD-10-CM | POA: Diagnosis not present

## 2014-01-22 DIAGNOSIS — Z681 Body mass index (BMI) 19 or less, adult: Secondary | ICD-10-CM | POA: Diagnosis not present

## 2014-01-22 DIAGNOSIS — G309 Alzheimer's disease, unspecified: Secondary | ICD-10-CM | POA: Diagnosis not present

## 2014-01-22 DIAGNOSIS — J3089 Other allergic rhinitis: Secondary | ICD-10-CM | POA: Diagnosis not present

## 2014-01-23 DIAGNOSIS — M1990 Unspecified osteoarthritis, unspecified site: Secondary | ICD-10-CM | POA: Diagnosis not present

## 2014-01-23 DIAGNOSIS — R634 Abnormal weight loss: Secondary | ICD-10-CM | POA: Diagnosis not present

## 2014-01-23 DIAGNOSIS — G309 Alzheimer's disease, unspecified: Secondary | ICD-10-CM | POA: Diagnosis not present

## 2014-01-23 DIAGNOSIS — J3089 Other allergic rhinitis: Secondary | ICD-10-CM | POA: Diagnosis not present

## 2014-01-23 DIAGNOSIS — F0280 Dementia in other diseases classified elsewhere without behavioral disturbance: Secondary | ICD-10-CM | POA: Diagnosis not present

## 2014-01-23 DIAGNOSIS — Z681 Body mass index (BMI) 19 or less, adult: Secondary | ICD-10-CM | POA: Diagnosis not present

## 2014-01-26 DIAGNOSIS — F0280 Dementia in other diseases classified elsewhere without behavioral disturbance: Secondary | ICD-10-CM | POA: Diagnosis not present

## 2014-01-26 DIAGNOSIS — J3089 Other allergic rhinitis: Secondary | ICD-10-CM | POA: Diagnosis not present

## 2014-01-26 DIAGNOSIS — M1990 Unspecified osteoarthritis, unspecified site: Secondary | ICD-10-CM | POA: Diagnosis not present

## 2014-01-26 DIAGNOSIS — Z681 Body mass index (BMI) 19 or less, adult: Secondary | ICD-10-CM | POA: Diagnosis not present

## 2014-01-26 DIAGNOSIS — G309 Alzheimer's disease, unspecified: Secondary | ICD-10-CM | POA: Diagnosis not present

## 2014-01-26 DIAGNOSIS — R634 Abnormal weight loss: Secondary | ICD-10-CM | POA: Diagnosis not present

## 2014-01-27 DIAGNOSIS — G309 Alzheimer's disease, unspecified: Secondary | ICD-10-CM | POA: Diagnosis not present

## 2014-01-27 DIAGNOSIS — Z681 Body mass index (BMI) 19 or less, adult: Secondary | ICD-10-CM | POA: Diagnosis not present

## 2014-01-27 DIAGNOSIS — F0280 Dementia in other diseases classified elsewhere without behavioral disturbance: Secondary | ICD-10-CM | POA: Diagnosis not present

## 2014-01-27 DIAGNOSIS — M1990 Unspecified osteoarthritis, unspecified site: Secondary | ICD-10-CM | POA: Diagnosis not present

## 2014-01-27 DIAGNOSIS — J3089 Other allergic rhinitis: Secondary | ICD-10-CM | POA: Diagnosis not present

## 2014-01-27 DIAGNOSIS — R634 Abnormal weight loss: Secondary | ICD-10-CM | POA: Diagnosis not present

## 2014-01-28 DIAGNOSIS — Z681 Body mass index (BMI) 19 or less, adult: Secondary | ICD-10-CM | POA: Diagnosis not present

## 2014-01-28 DIAGNOSIS — M1990 Unspecified osteoarthritis, unspecified site: Secondary | ICD-10-CM | POA: Diagnosis not present

## 2014-01-28 DIAGNOSIS — F0280 Dementia in other diseases classified elsewhere without behavioral disturbance: Secondary | ICD-10-CM | POA: Diagnosis not present

## 2014-01-28 DIAGNOSIS — R634 Abnormal weight loss: Secondary | ICD-10-CM | POA: Diagnosis not present

## 2014-01-28 DIAGNOSIS — G309 Alzheimer's disease, unspecified: Secondary | ICD-10-CM | POA: Diagnosis not present

## 2014-01-28 DIAGNOSIS — J3089 Other allergic rhinitis: Secondary | ICD-10-CM | POA: Diagnosis not present

## 2014-01-29 DIAGNOSIS — Z681 Body mass index (BMI) 19 or less, adult: Secondary | ICD-10-CM | POA: Diagnosis not present

## 2014-01-29 DIAGNOSIS — J3089 Other allergic rhinitis: Secondary | ICD-10-CM | POA: Diagnosis not present

## 2014-01-29 DIAGNOSIS — G309 Alzheimer's disease, unspecified: Secondary | ICD-10-CM | POA: Diagnosis not present

## 2014-01-29 DIAGNOSIS — M1990 Unspecified osteoarthritis, unspecified site: Secondary | ICD-10-CM | POA: Diagnosis not present

## 2014-01-29 DIAGNOSIS — F0280 Dementia in other diseases classified elsewhere without behavioral disturbance: Secondary | ICD-10-CM | POA: Diagnosis not present

## 2014-01-29 DIAGNOSIS — R634 Abnormal weight loss: Secondary | ICD-10-CM | POA: Diagnosis not present

## 2014-01-30 DIAGNOSIS — Z681 Body mass index (BMI) 19 or less, adult: Secondary | ICD-10-CM | POA: Diagnosis not present

## 2014-01-30 DIAGNOSIS — G309 Alzheimer's disease, unspecified: Secondary | ICD-10-CM | POA: Diagnosis not present

## 2014-01-30 DIAGNOSIS — M1990 Unspecified osteoarthritis, unspecified site: Secondary | ICD-10-CM | POA: Diagnosis not present

## 2014-01-30 DIAGNOSIS — F0280 Dementia in other diseases classified elsewhere without behavioral disturbance: Secondary | ICD-10-CM | POA: Diagnosis not present

## 2014-01-30 DIAGNOSIS — R634 Abnormal weight loss: Secondary | ICD-10-CM | POA: Diagnosis not present

## 2014-01-30 DIAGNOSIS — J3089 Other allergic rhinitis: Secondary | ICD-10-CM | POA: Diagnosis not present

## 2014-02-02 DIAGNOSIS — J3089 Other allergic rhinitis: Secondary | ICD-10-CM | POA: Diagnosis not present

## 2014-02-02 DIAGNOSIS — F0280 Dementia in other diseases classified elsewhere without behavioral disturbance: Secondary | ICD-10-CM | POA: Diagnosis not present

## 2014-02-02 DIAGNOSIS — M1990 Unspecified osteoarthritis, unspecified site: Secondary | ICD-10-CM | POA: Diagnosis not present

## 2014-02-02 DIAGNOSIS — Z681 Body mass index (BMI) 19 or less, adult: Secondary | ICD-10-CM | POA: Diagnosis not present

## 2014-02-02 DIAGNOSIS — G309 Alzheimer's disease, unspecified: Secondary | ICD-10-CM | POA: Diagnosis not present

## 2014-02-02 DIAGNOSIS — R634 Abnormal weight loss: Secondary | ICD-10-CM | POA: Diagnosis not present

## 2014-02-03 DIAGNOSIS — J3089 Other allergic rhinitis: Secondary | ICD-10-CM | POA: Diagnosis not present

## 2014-02-03 DIAGNOSIS — Z681 Body mass index (BMI) 19 or less, adult: Secondary | ICD-10-CM | POA: Diagnosis not present

## 2014-02-03 DIAGNOSIS — F0280 Dementia in other diseases classified elsewhere without behavioral disturbance: Secondary | ICD-10-CM | POA: Diagnosis not present

## 2014-02-03 DIAGNOSIS — G309 Alzheimer's disease, unspecified: Secondary | ICD-10-CM | POA: Diagnosis not present

## 2014-02-03 DIAGNOSIS — R634 Abnormal weight loss: Secondary | ICD-10-CM | POA: Diagnosis not present

## 2014-02-03 DIAGNOSIS — M1990 Unspecified osteoarthritis, unspecified site: Secondary | ICD-10-CM | POA: Diagnosis not present

## 2014-02-04 DIAGNOSIS — M1990 Unspecified osteoarthritis, unspecified site: Secondary | ICD-10-CM | POA: Diagnosis not present

## 2014-02-04 DIAGNOSIS — J3089 Other allergic rhinitis: Secondary | ICD-10-CM | POA: Diagnosis not present

## 2014-02-04 DIAGNOSIS — G309 Alzheimer's disease, unspecified: Secondary | ICD-10-CM | POA: Diagnosis not present

## 2014-02-04 DIAGNOSIS — F0280 Dementia in other diseases classified elsewhere without behavioral disturbance: Secondary | ICD-10-CM | POA: Diagnosis not present

## 2014-02-04 DIAGNOSIS — R634 Abnormal weight loss: Secondary | ICD-10-CM | POA: Diagnosis not present

## 2014-02-04 DIAGNOSIS — Z681 Body mass index (BMI) 19 or less, adult: Secondary | ICD-10-CM | POA: Diagnosis not present

## 2014-02-05 DIAGNOSIS — J3089 Other allergic rhinitis: Secondary | ICD-10-CM | POA: Diagnosis not present

## 2014-02-05 DIAGNOSIS — R634 Abnormal weight loss: Secondary | ICD-10-CM | POA: Diagnosis not present

## 2014-02-05 DIAGNOSIS — Z681 Body mass index (BMI) 19 or less, adult: Secondary | ICD-10-CM | POA: Diagnosis not present

## 2014-02-05 DIAGNOSIS — F0280 Dementia in other diseases classified elsewhere without behavioral disturbance: Secondary | ICD-10-CM | POA: Diagnosis not present

## 2014-02-05 DIAGNOSIS — M1990 Unspecified osteoarthritis, unspecified site: Secondary | ICD-10-CM | POA: Diagnosis not present

## 2014-02-05 DIAGNOSIS — G309 Alzheimer's disease, unspecified: Secondary | ICD-10-CM | POA: Diagnosis not present

## 2014-02-06 DIAGNOSIS — G309 Alzheimer's disease, unspecified: Secondary | ICD-10-CM | POA: Diagnosis not present

## 2014-02-06 DIAGNOSIS — M1990 Unspecified osteoarthritis, unspecified site: Secondary | ICD-10-CM | POA: Diagnosis not present

## 2014-02-06 DIAGNOSIS — F0280 Dementia in other diseases classified elsewhere without behavioral disturbance: Secondary | ICD-10-CM | POA: Diagnosis not present

## 2014-02-06 DIAGNOSIS — R634 Abnormal weight loss: Secondary | ICD-10-CM | POA: Diagnosis not present

## 2014-02-06 DIAGNOSIS — J3089 Other allergic rhinitis: Secondary | ICD-10-CM | POA: Diagnosis not present

## 2014-02-06 DIAGNOSIS — Z681 Body mass index (BMI) 19 or less, adult: Secondary | ICD-10-CM | POA: Diagnosis not present

## 2014-02-09 DIAGNOSIS — Z681 Body mass index (BMI) 19 or less, adult: Secondary | ICD-10-CM | POA: Diagnosis not present

## 2014-02-09 DIAGNOSIS — G309 Alzheimer's disease, unspecified: Secondary | ICD-10-CM | POA: Diagnosis not present

## 2014-02-09 DIAGNOSIS — M1990 Unspecified osteoarthritis, unspecified site: Secondary | ICD-10-CM | POA: Diagnosis not present

## 2014-02-09 DIAGNOSIS — F0280 Dementia in other diseases classified elsewhere without behavioral disturbance: Secondary | ICD-10-CM | POA: Diagnosis not present

## 2014-02-09 DIAGNOSIS — R634 Abnormal weight loss: Secondary | ICD-10-CM | POA: Diagnosis not present

## 2014-02-09 DIAGNOSIS — J3089 Other allergic rhinitis: Secondary | ICD-10-CM | POA: Diagnosis not present

## 2014-02-10 DIAGNOSIS — Z681 Body mass index (BMI) 19 or less, adult: Secondary | ICD-10-CM | POA: Diagnosis not present

## 2014-02-10 DIAGNOSIS — J3089 Other allergic rhinitis: Secondary | ICD-10-CM | POA: Diagnosis not present

## 2014-02-10 DIAGNOSIS — F0280 Dementia in other diseases classified elsewhere without behavioral disturbance: Secondary | ICD-10-CM | POA: Diagnosis not present

## 2014-02-10 DIAGNOSIS — R634 Abnormal weight loss: Secondary | ICD-10-CM | POA: Diagnosis not present

## 2014-02-10 DIAGNOSIS — M1990 Unspecified osteoarthritis, unspecified site: Secondary | ICD-10-CM | POA: Diagnosis not present

## 2014-02-10 DIAGNOSIS — G309 Alzheimer's disease, unspecified: Secondary | ICD-10-CM | POA: Diagnosis not present

## 2014-02-11 DIAGNOSIS — R634 Abnormal weight loss: Secondary | ICD-10-CM | POA: Diagnosis not present

## 2014-02-11 DIAGNOSIS — G309 Alzheimer's disease, unspecified: Secondary | ICD-10-CM | POA: Diagnosis not present

## 2014-02-11 DIAGNOSIS — F0280 Dementia in other diseases classified elsewhere without behavioral disturbance: Secondary | ICD-10-CM | POA: Diagnosis not present

## 2014-02-11 DIAGNOSIS — Z681 Body mass index (BMI) 19 or less, adult: Secondary | ICD-10-CM | POA: Diagnosis not present

## 2014-02-11 DIAGNOSIS — M1990 Unspecified osteoarthritis, unspecified site: Secondary | ICD-10-CM | POA: Diagnosis not present

## 2014-02-11 DIAGNOSIS — J3089 Other allergic rhinitis: Secondary | ICD-10-CM | POA: Diagnosis not present

## 2014-02-12 DIAGNOSIS — G309 Alzheimer's disease, unspecified: Secondary | ICD-10-CM | POA: Diagnosis not present

## 2014-02-12 DIAGNOSIS — Z681 Body mass index (BMI) 19 or less, adult: Secondary | ICD-10-CM | POA: Diagnosis not present

## 2014-02-12 DIAGNOSIS — F0280 Dementia in other diseases classified elsewhere without behavioral disturbance: Secondary | ICD-10-CM | POA: Diagnosis not present

## 2014-02-12 DIAGNOSIS — M1990 Unspecified osteoarthritis, unspecified site: Secondary | ICD-10-CM | POA: Diagnosis not present

## 2014-02-12 DIAGNOSIS — J3089 Other allergic rhinitis: Secondary | ICD-10-CM | POA: Diagnosis not present

## 2014-02-12 DIAGNOSIS — R634 Abnormal weight loss: Secondary | ICD-10-CM | POA: Diagnosis not present

## 2014-02-13 DIAGNOSIS — Z681 Body mass index (BMI) 19 or less, adult: Secondary | ICD-10-CM | POA: Diagnosis not present

## 2014-02-13 DIAGNOSIS — G309 Alzheimer's disease, unspecified: Secondary | ICD-10-CM | POA: Diagnosis not present

## 2014-02-13 DIAGNOSIS — M1990 Unspecified osteoarthritis, unspecified site: Secondary | ICD-10-CM | POA: Diagnosis not present

## 2014-02-13 DIAGNOSIS — J3089 Other allergic rhinitis: Secondary | ICD-10-CM | POA: Diagnosis not present

## 2014-02-13 DIAGNOSIS — R634 Abnormal weight loss: Secondary | ICD-10-CM | POA: Diagnosis not present

## 2014-02-13 DIAGNOSIS — F0280 Dementia in other diseases classified elsewhere without behavioral disturbance: Secondary | ICD-10-CM | POA: Diagnosis not present

## 2014-02-16 DIAGNOSIS — R634 Abnormal weight loss: Secondary | ICD-10-CM | POA: Diagnosis not present

## 2014-02-16 DIAGNOSIS — F0280 Dementia in other diseases classified elsewhere without behavioral disturbance: Secondary | ICD-10-CM | POA: Diagnosis not present

## 2014-02-16 DIAGNOSIS — J3089 Other allergic rhinitis: Secondary | ICD-10-CM | POA: Diagnosis not present

## 2014-02-16 DIAGNOSIS — G309 Alzheimer's disease, unspecified: Secondary | ICD-10-CM | POA: Diagnosis not present

## 2014-02-16 DIAGNOSIS — Z681 Body mass index (BMI) 19 or less, adult: Secondary | ICD-10-CM | POA: Diagnosis not present

## 2014-02-16 DIAGNOSIS — M1990 Unspecified osteoarthritis, unspecified site: Secondary | ICD-10-CM | POA: Diagnosis not present

## 2014-02-17 DIAGNOSIS — J3089 Other allergic rhinitis: Secondary | ICD-10-CM | POA: Diagnosis not present

## 2014-02-17 DIAGNOSIS — F0280 Dementia in other diseases classified elsewhere without behavioral disturbance: Secondary | ICD-10-CM | POA: Diagnosis not present

## 2014-02-17 DIAGNOSIS — G309 Alzheimer's disease, unspecified: Secondary | ICD-10-CM | POA: Diagnosis not present

## 2014-02-17 DIAGNOSIS — Z681 Body mass index (BMI) 19 or less, adult: Secondary | ICD-10-CM | POA: Diagnosis not present

## 2014-02-17 DIAGNOSIS — M1990 Unspecified osteoarthritis, unspecified site: Secondary | ICD-10-CM | POA: Diagnosis not present

## 2014-02-17 DIAGNOSIS — R634 Abnormal weight loss: Secondary | ICD-10-CM | POA: Diagnosis not present

## 2014-02-18 DIAGNOSIS — R634 Abnormal weight loss: Secondary | ICD-10-CM | POA: Diagnosis not present

## 2014-02-18 DIAGNOSIS — J3089 Other allergic rhinitis: Secondary | ICD-10-CM | POA: Diagnosis not present

## 2014-02-18 DIAGNOSIS — M1990 Unspecified osteoarthritis, unspecified site: Secondary | ICD-10-CM | POA: Diagnosis not present

## 2014-02-18 DIAGNOSIS — Z681 Body mass index (BMI) 19 or less, adult: Secondary | ICD-10-CM | POA: Diagnosis not present

## 2014-02-18 DIAGNOSIS — G309 Alzheimer's disease, unspecified: Secondary | ICD-10-CM | POA: Diagnosis not present

## 2014-02-18 DIAGNOSIS — F0280 Dementia in other diseases classified elsewhere without behavioral disturbance: Secondary | ICD-10-CM | POA: Diagnosis not present

## 2014-02-19 DIAGNOSIS — G309 Alzheimer's disease, unspecified: Secondary | ICD-10-CM | POA: Diagnosis not present

## 2014-02-19 DIAGNOSIS — F0280 Dementia in other diseases classified elsewhere without behavioral disturbance: Secondary | ICD-10-CM | POA: Diagnosis not present

## 2014-02-19 DIAGNOSIS — J3089 Other allergic rhinitis: Secondary | ICD-10-CM | POA: Diagnosis not present

## 2014-02-19 DIAGNOSIS — R634 Abnormal weight loss: Secondary | ICD-10-CM | POA: Diagnosis not present

## 2014-02-19 DIAGNOSIS — Z681 Body mass index (BMI) 19 or less, adult: Secondary | ICD-10-CM | POA: Diagnosis not present

## 2014-02-19 DIAGNOSIS — M1990 Unspecified osteoarthritis, unspecified site: Secondary | ICD-10-CM | POA: Diagnosis not present

## 2014-02-20 DIAGNOSIS — F0280 Dementia in other diseases classified elsewhere without behavioral disturbance: Secondary | ICD-10-CM | POA: Diagnosis not present

## 2014-02-20 DIAGNOSIS — G309 Alzheimer's disease, unspecified: Secondary | ICD-10-CM | POA: Diagnosis not present

## 2014-02-20 DIAGNOSIS — Z681 Body mass index (BMI) 19 or less, adult: Secondary | ICD-10-CM | POA: Diagnosis not present

## 2014-02-20 DIAGNOSIS — J3089 Other allergic rhinitis: Secondary | ICD-10-CM | POA: Diagnosis not present

## 2014-02-20 DIAGNOSIS — M1990 Unspecified osteoarthritis, unspecified site: Secondary | ICD-10-CM | POA: Diagnosis not present

## 2014-02-20 DIAGNOSIS — R634 Abnormal weight loss: Secondary | ICD-10-CM | POA: Diagnosis not present

## 2014-02-23 DIAGNOSIS — Z681 Body mass index (BMI) 19 or less, adult: Secondary | ICD-10-CM | POA: Diagnosis not present

## 2014-02-23 DIAGNOSIS — G309 Alzheimer's disease, unspecified: Secondary | ICD-10-CM | POA: Diagnosis not present

## 2014-02-23 DIAGNOSIS — J3089 Other allergic rhinitis: Secondary | ICD-10-CM | POA: Diagnosis not present

## 2014-02-23 DIAGNOSIS — R634 Abnormal weight loss: Secondary | ICD-10-CM | POA: Diagnosis not present

## 2014-02-23 DIAGNOSIS — F0280 Dementia in other diseases classified elsewhere without behavioral disturbance: Secondary | ICD-10-CM | POA: Diagnosis not present

## 2014-02-23 DIAGNOSIS — M1990 Unspecified osteoarthritis, unspecified site: Secondary | ICD-10-CM | POA: Diagnosis not present

## 2014-02-24 DIAGNOSIS — J3089 Other allergic rhinitis: Secondary | ICD-10-CM | POA: Diagnosis not present

## 2014-02-24 DIAGNOSIS — M1990 Unspecified osteoarthritis, unspecified site: Secondary | ICD-10-CM | POA: Diagnosis not present

## 2014-02-24 DIAGNOSIS — R634 Abnormal weight loss: Secondary | ICD-10-CM | POA: Diagnosis not present

## 2014-02-24 DIAGNOSIS — Z681 Body mass index (BMI) 19 or less, adult: Secondary | ICD-10-CM | POA: Diagnosis not present

## 2014-02-24 DIAGNOSIS — G309 Alzheimer's disease, unspecified: Secondary | ICD-10-CM | POA: Diagnosis not present

## 2014-02-24 DIAGNOSIS — F0280 Dementia in other diseases classified elsewhere without behavioral disturbance: Secondary | ICD-10-CM | POA: Diagnosis not present

## 2014-02-25 DIAGNOSIS — R634 Abnormal weight loss: Secondary | ICD-10-CM | POA: Diagnosis not present

## 2014-02-25 DIAGNOSIS — F0280 Dementia in other diseases classified elsewhere without behavioral disturbance: Secondary | ICD-10-CM | POA: Diagnosis not present

## 2014-02-25 DIAGNOSIS — Z681 Body mass index (BMI) 19 or less, adult: Secondary | ICD-10-CM | POA: Diagnosis not present

## 2014-02-25 DIAGNOSIS — J3089 Other allergic rhinitis: Secondary | ICD-10-CM | POA: Diagnosis not present

## 2014-02-25 DIAGNOSIS — G309 Alzheimer's disease, unspecified: Secondary | ICD-10-CM | POA: Diagnosis not present

## 2014-02-25 DIAGNOSIS — M1990 Unspecified osteoarthritis, unspecified site: Secondary | ICD-10-CM | POA: Diagnosis not present

## 2014-02-26 DIAGNOSIS — G309 Alzheimer's disease, unspecified: Secondary | ICD-10-CM | POA: Diagnosis not present

## 2014-02-26 DIAGNOSIS — R634 Abnormal weight loss: Secondary | ICD-10-CM | POA: Diagnosis not present

## 2014-02-26 DIAGNOSIS — Z681 Body mass index (BMI) 19 or less, adult: Secondary | ICD-10-CM | POA: Diagnosis not present

## 2014-02-26 DIAGNOSIS — F0280 Dementia in other diseases classified elsewhere without behavioral disturbance: Secondary | ICD-10-CM | POA: Diagnosis not present

## 2014-02-26 DIAGNOSIS — J3089 Other allergic rhinitis: Secondary | ICD-10-CM | POA: Diagnosis not present

## 2014-02-26 DIAGNOSIS — M1990 Unspecified osteoarthritis, unspecified site: Secondary | ICD-10-CM | POA: Diagnosis not present

## 2014-02-27 DIAGNOSIS — F0280 Dementia in other diseases classified elsewhere without behavioral disturbance: Secondary | ICD-10-CM | POA: Diagnosis not present

## 2014-02-27 DIAGNOSIS — J3089 Other allergic rhinitis: Secondary | ICD-10-CM | POA: Diagnosis not present

## 2014-02-27 DIAGNOSIS — Z681 Body mass index (BMI) 19 or less, adult: Secondary | ICD-10-CM | POA: Diagnosis not present

## 2014-02-27 DIAGNOSIS — R634 Abnormal weight loss: Secondary | ICD-10-CM | POA: Diagnosis not present

## 2014-02-27 DIAGNOSIS — M1990 Unspecified osteoarthritis, unspecified site: Secondary | ICD-10-CM | POA: Diagnosis not present

## 2014-02-27 DIAGNOSIS — G309 Alzheimer's disease, unspecified: Secondary | ICD-10-CM | POA: Diagnosis not present

## 2014-03-02 DIAGNOSIS — J3089 Other allergic rhinitis: Secondary | ICD-10-CM | POA: Diagnosis not present

## 2014-03-02 DIAGNOSIS — G309 Alzheimer's disease, unspecified: Secondary | ICD-10-CM | POA: Diagnosis not present

## 2014-03-02 DIAGNOSIS — M1990 Unspecified osteoarthritis, unspecified site: Secondary | ICD-10-CM | POA: Diagnosis not present

## 2014-03-02 DIAGNOSIS — F0280 Dementia in other diseases classified elsewhere without behavioral disturbance: Secondary | ICD-10-CM | POA: Diagnosis not present

## 2014-03-02 DIAGNOSIS — Z681 Body mass index (BMI) 19 or less, adult: Secondary | ICD-10-CM | POA: Diagnosis not present

## 2014-03-02 DIAGNOSIS — R634 Abnormal weight loss: Secondary | ICD-10-CM | POA: Diagnosis not present

## 2014-03-03 DIAGNOSIS — M1990 Unspecified osteoarthritis, unspecified site: Secondary | ICD-10-CM | POA: Diagnosis not present

## 2014-03-03 DIAGNOSIS — Z681 Body mass index (BMI) 19 or less, adult: Secondary | ICD-10-CM | POA: Diagnosis not present

## 2014-03-03 DIAGNOSIS — J3089 Other allergic rhinitis: Secondary | ICD-10-CM | POA: Diagnosis not present

## 2014-03-03 DIAGNOSIS — R634 Abnormal weight loss: Secondary | ICD-10-CM | POA: Diagnosis not present

## 2014-03-03 DIAGNOSIS — G309 Alzheimer's disease, unspecified: Secondary | ICD-10-CM | POA: Diagnosis not present

## 2014-03-03 DIAGNOSIS — F0280 Dementia in other diseases classified elsewhere without behavioral disturbance: Secondary | ICD-10-CM | POA: Diagnosis not present

## 2014-03-04 DIAGNOSIS — R634 Abnormal weight loss: Secondary | ICD-10-CM | POA: Diagnosis not present

## 2014-03-04 DIAGNOSIS — J3089 Other allergic rhinitis: Secondary | ICD-10-CM | POA: Diagnosis not present

## 2014-03-04 DIAGNOSIS — F0280 Dementia in other diseases classified elsewhere without behavioral disturbance: Secondary | ICD-10-CM | POA: Diagnosis not present

## 2014-03-04 DIAGNOSIS — G309 Alzheimer's disease, unspecified: Secondary | ICD-10-CM | POA: Diagnosis not present

## 2014-03-04 DIAGNOSIS — M1990 Unspecified osteoarthritis, unspecified site: Secondary | ICD-10-CM | POA: Diagnosis not present

## 2014-03-04 DIAGNOSIS — Z681 Body mass index (BMI) 19 or less, adult: Secondary | ICD-10-CM | POA: Diagnosis not present

## 2014-03-05 DIAGNOSIS — F0280 Dementia in other diseases classified elsewhere without behavioral disturbance: Secondary | ICD-10-CM | POA: Diagnosis not present

## 2014-03-05 DIAGNOSIS — J3089 Other allergic rhinitis: Secondary | ICD-10-CM | POA: Diagnosis not present

## 2014-03-05 DIAGNOSIS — Z681 Body mass index (BMI) 19 or less, adult: Secondary | ICD-10-CM | POA: Diagnosis not present

## 2014-03-05 DIAGNOSIS — R634 Abnormal weight loss: Secondary | ICD-10-CM | POA: Diagnosis not present

## 2014-03-05 DIAGNOSIS — G309 Alzheimer's disease, unspecified: Secondary | ICD-10-CM | POA: Diagnosis not present

## 2014-03-05 DIAGNOSIS — M1990 Unspecified osteoarthritis, unspecified site: Secondary | ICD-10-CM | POA: Diagnosis not present

## 2014-03-06 DIAGNOSIS — Z681 Body mass index (BMI) 19 or less, adult: Secondary | ICD-10-CM | POA: Diagnosis not present

## 2014-03-06 DIAGNOSIS — J3089 Other allergic rhinitis: Secondary | ICD-10-CM | POA: Diagnosis not present

## 2014-03-06 DIAGNOSIS — R634 Abnormal weight loss: Secondary | ICD-10-CM | POA: Diagnosis not present

## 2014-03-06 DIAGNOSIS — G309 Alzheimer's disease, unspecified: Secondary | ICD-10-CM | POA: Diagnosis not present

## 2014-03-06 DIAGNOSIS — F0280 Dementia in other diseases classified elsewhere without behavioral disturbance: Secondary | ICD-10-CM | POA: Diagnosis not present

## 2014-03-06 DIAGNOSIS — M1990 Unspecified osteoarthritis, unspecified site: Secondary | ICD-10-CM | POA: Diagnosis not present

## 2014-03-09 DIAGNOSIS — G309 Alzheimer's disease, unspecified: Secondary | ICD-10-CM | POA: Diagnosis not present

## 2014-03-09 DIAGNOSIS — M1990 Unspecified osteoarthritis, unspecified site: Secondary | ICD-10-CM | POA: Diagnosis not present

## 2014-03-09 DIAGNOSIS — R634 Abnormal weight loss: Secondary | ICD-10-CM | POA: Diagnosis not present

## 2014-03-09 DIAGNOSIS — J3089 Other allergic rhinitis: Secondary | ICD-10-CM | POA: Diagnosis not present

## 2014-03-09 DIAGNOSIS — Z681 Body mass index (BMI) 19 or less, adult: Secondary | ICD-10-CM | POA: Diagnosis not present

## 2014-03-09 DIAGNOSIS — F0280 Dementia in other diseases classified elsewhere without behavioral disturbance: Secondary | ICD-10-CM | POA: Diagnosis not present

## 2014-03-10 DIAGNOSIS — F0280 Dementia in other diseases classified elsewhere without behavioral disturbance: Secondary | ICD-10-CM | POA: Diagnosis not present

## 2014-03-10 DIAGNOSIS — Z681 Body mass index (BMI) 19 or less, adult: Secondary | ICD-10-CM | POA: Diagnosis not present

## 2014-03-10 DIAGNOSIS — J3089 Other allergic rhinitis: Secondary | ICD-10-CM | POA: Diagnosis not present

## 2014-03-10 DIAGNOSIS — G309 Alzheimer's disease, unspecified: Secondary | ICD-10-CM | POA: Diagnosis not present

## 2014-03-10 DIAGNOSIS — R634 Abnormal weight loss: Secondary | ICD-10-CM | POA: Diagnosis not present

## 2014-03-10 DIAGNOSIS — M1990 Unspecified osteoarthritis, unspecified site: Secondary | ICD-10-CM | POA: Diagnosis not present

## 2014-03-11 DIAGNOSIS — F0280 Dementia in other diseases classified elsewhere without behavioral disturbance: Secondary | ICD-10-CM | POA: Diagnosis not present

## 2014-03-11 DIAGNOSIS — G309 Alzheimer's disease, unspecified: Secondary | ICD-10-CM | POA: Diagnosis not present

## 2014-03-11 DIAGNOSIS — M1990 Unspecified osteoarthritis, unspecified site: Secondary | ICD-10-CM | POA: Diagnosis not present

## 2014-03-11 DIAGNOSIS — J3089 Other allergic rhinitis: Secondary | ICD-10-CM | POA: Diagnosis not present

## 2014-03-11 DIAGNOSIS — R634 Abnormal weight loss: Secondary | ICD-10-CM | POA: Diagnosis not present

## 2014-03-11 DIAGNOSIS — Z681 Body mass index (BMI) 19 or less, adult: Secondary | ICD-10-CM | POA: Diagnosis not present

## 2014-03-12 DIAGNOSIS — F0280 Dementia in other diseases classified elsewhere without behavioral disturbance: Secondary | ICD-10-CM | POA: Diagnosis not present

## 2014-03-12 DIAGNOSIS — M1990 Unspecified osteoarthritis, unspecified site: Secondary | ICD-10-CM | POA: Diagnosis not present

## 2014-03-12 DIAGNOSIS — G309 Alzheimer's disease, unspecified: Secondary | ICD-10-CM | POA: Diagnosis not present

## 2014-03-12 DIAGNOSIS — R634 Abnormal weight loss: Secondary | ICD-10-CM | POA: Diagnosis not present

## 2014-03-12 DIAGNOSIS — Z681 Body mass index (BMI) 19 or less, adult: Secondary | ICD-10-CM | POA: Diagnosis not present

## 2014-03-12 DIAGNOSIS — J3089 Other allergic rhinitis: Secondary | ICD-10-CM | POA: Diagnosis not present

## 2014-03-13 DIAGNOSIS — F0280 Dementia in other diseases classified elsewhere without behavioral disturbance: Secondary | ICD-10-CM | POA: Diagnosis not present

## 2014-03-13 DIAGNOSIS — Z681 Body mass index (BMI) 19 or less, adult: Secondary | ICD-10-CM | POA: Diagnosis not present

## 2014-03-13 DIAGNOSIS — G309 Alzheimer's disease, unspecified: Secondary | ICD-10-CM | POA: Diagnosis not present

## 2014-03-13 DIAGNOSIS — J3089 Other allergic rhinitis: Secondary | ICD-10-CM | POA: Diagnosis not present

## 2014-03-13 DIAGNOSIS — R634 Abnormal weight loss: Secondary | ICD-10-CM | POA: Diagnosis not present

## 2014-03-13 DIAGNOSIS — M1990 Unspecified osteoarthritis, unspecified site: Secondary | ICD-10-CM | POA: Diagnosis not present

## 2014-03-16 DIAGNOSIS — R634 Abnormal weight loss: Secondary | ICD-10-CM | POA: Diagnosis not present

## 2014-03-16 DIAGNOSIS — F0280 Dementia in other diseases classified elsewhere without behavioral disturbance: Secondary | ICD-10-CM | POA: Diagnosis not present

## 2014-03-16 DIAGNOSIS — M1990 Unspecified osteoarthritis, unspecified site: Secondary | ICD-10-CM | POA: Diagnosis not present

## 2014-03-16 DIAGNOSIS — G309 Alzheimer's disease, unspecified: Secondary | ICD-10-CM | POA: Diagnosis not present

## 2014-03-16 DIAGNOSIS — Z681 Body mass index (BMI) 19 or less, adult: Secondary | ICD-10-CM | POA: Diagnosis not present

## 2014-03-16 DIAGNOSIS — J3089 Other allergic rhinitis: Secondary | ICD-10-CM | POA: Diagnosis not present

## 2014-03-17 DIAGNOSIS — M1990 Unspecified osteoarthritis, unspecified site: Secondary | ICD-10-CM | POA: Diagnosis not present

## 2014-03-17 DIAGNOSIS — R634 Abnormal weight loss: Secondary | ICD-10-CM | POA: Diagnosis not present

## 2014-03-17 DIAGNOSIS — J3089 Other allergic rhinitis: Secondary | ICD-10-CM | POA: Diagnosis not present

## 2014-03-17 DIAGNOSIS — F0280 Dementia in other diseases classified elsewhere without behavioral disturbance: Secondary | ICD-10-CM | POA: Diagnosis not present

## 2014-03-17 DIAGNOSIS — Z681 Body mass index (BMI) 19 or less, adult: Secondary | ICD-10-CM | POA: Diagnosis not present

## 2014-03-17 DIAGNOSIS — G309 Alzheimer's disease, unspecified: Secondary | ICD-10-CM | POA: Diagnosis not present

## 2014-03-18 DIAGNOSIS — F0280 Dementia in other diseases classified elsewhere without behavioral disturbance: Secondary | ICD-10-CM | POA: Diagnosis not present

## 2014-03-18 DIAGNOSIS — M1990 Unspecified osteoarthritis, unspecified site: Secondary | ICD-10-CM | POA: Diagnosis not present

## 2014-03-18 DIAGNOSIS — Z681 Body mass index (BMI) 19 or less, adult: Secondary | ICD-10-CM | POA: Diagnosis not present

## 2014-03-18 DIAGNOSIS — R634 Abnormal weight loss: Secondary | ICD-10-CM | POA: Diagnosis not present

## 2014-03-18 DIAGNOSIS — G309 Alzheimer's disease, unspecified: Secondary | ICD-10-CM | POA: Diagnosis not present

## 2014-03-18 DIAGNOSIS — J3089 Other allergic rhinitis: Secondary | ICD-10-CM | POA: Diagnosis not present

## 2014-03-19 DIAGNOSIS — R634 Abnormal weight loss: Secondary | ICD-10-CM | POA: Diagnosis not present

## 2014-03-19 DIAGNOSIS — F0280 Dementia in other diseases classified elsewhere without behavioral disturbance: Secondary | ICD-10-CM | POA: Diagnosis not present

## 2014-03-19 DIAGNOSIS — Z681 Body mass index (BMI) 19 or less, adult: Secondary | ICD-10-CM | POA: Diagnosis not present

## 2014-03-19 DIAGNOSIS — J3089 Other allergic rhinitis: Secondary | ICD-10-CM | POA: Diagnosis not present

## 2014-03-19 DIAGNOSIS — G309 Alzheimer's disease, unspecified: Secondary | ICD-10-CM | POA: Diagnosis not present

## 2014-03-19 DIAGNOSIS — M1990 Unspecified osteoarthritis, unspecified site: Secondary | ICD-10-CM | POA: Diagnosis not present

## 2014-03-20 DIAGNOSIS — F0280 Dementia in other diseases classified elsewhere without behavioral disturbance: Secondary | ICD-10-CM | POA: Diagnosis not present

## 2014-03-20 DIAGNOSIS — G309 Alzheimer's disease, unspecified: Secondary | ICD-10-CM | POA: Diagnosis not present

## 2014-03-20 DIAGNOSIS — R634 Abnormal weight loss: Secondary | ICD-10-CM | POA: Diagnosis not present

## 2014-03-20 DIAGNOSIS — M1990 Unspecified osteoarthritis, unspecified site: Secondary | ICD-10-CM | POA: Diagnosis not present

## 2014-03-20 DIAGNOSIS — Z681 Body mass index (BMI) 19 or less, adult: Secondary | ICD-10-CM | POA: Diagnosis not present

## 2014-03-20 DIAGNOSIS — J3089 Other allergic rhinitis: Secondary | ICD-10-CM | POA: Diagnosis not present

## 2014-03-23 DIAGNOSIS — Z681 Body mass index (BMI) 19 or less, adult: Secondary | ICD-10-CM | POA: Diagnosis not present

## 2014-03-23 DIAGNOSIS — M1990 Unspecified osteoarthritis, unspecified site: Secondary | ICD-10-CM | POA: Diagnosis not present

## 2014-03-23 DIAGNOSIS — J3089 Other allergic rhinitis: Secondary | ICD-10-CM | POA: Diagnosis not present

## 2014-03-23 DIAGNOSIS — R634 Abnormal weight loss: Secondary | ICD-10-CM | POA: Diagnosis not present

## 2014-03-23 DIAGNOSIS — G309 Alzheimer's disease, unspecified: Secondary | ICD-10-CM | POA: Diagnosis not present

## 2014-03-23 DIAGNOSIS — F0280 Dementia in other diseases classified elsewhere without behavioral disturbance: Secondary | ICD-10-CM | POA: Diagnosis not present

## 2014-03-24 DIAGNOSIS — J3089 Other allergic rhinitis: Secondary | ICD-10-CM | POA: Diagnosis not present

## 2014-03-24 DIAGNOSIS — R634 Abnormal weight loss: Secondary | ICD-10-CM | POA: Diagnosis not present

## 2014-03-24 DIAGNOSIS — G309 Alzheimer's disease, unspecified: Secondary | ICD-10-CM | POA: Diagnosis not present

## 2014-03-24 DIAGNOSIS — Z681 Body mass index (BMI) 19 or less, adult: Secondary | ICD-10-CM | POA: Diagnosis not present

## 2014-03-24 DIAGNOSIS — F0280 Dementia in other diseases classified elsewhere without behavioral disturbance: Secondary | ICD-10-CM | POA: Diagnosis not present

## 2014-03-24 DIAGNOSIS — M1990 Unspecified osteoarthritis, unspecified site: Secondary | ICD-10-CM | POA: Diagnosis not present

## 2014-03-25 DIAGNOSIS — F0280 Dementia in other diseases classified elsewhere without behavioral disturbance: Secondary | ICD-10-CM | POA: Diagnosis not present

## 2014-03-25 DIAGNOSIS — G309 Alzheimer's disease, unspecified: Secondary | ICD-10-CM | POA: Diagnosis not present

## 2014-03-25 DIAGNOSIS — Z681 Body mass index (BMI) 19 or less, adult: Secondary | ICD-10-CM | POA: Diagnosis not present

## 2014-03-25 DIAGNOSIS — J3089 Other allergic rhinitis: Secondary | ICD-10-CM | POA: Diagnosis not present

## 2014-03-25 DIAGNOSIS — R634 Abnormal weight loss: Secondary | ICD-10-CM | POA: Diagnosis not present

## 2014-03-25 DIAGNOSIS — M1990 Unspecified osteoarthritis, unspecified site: Secondary | ICD-10-CM | POA: Diagnosis not present

## 2014-03-26 DIAGNOSIS — R634 Abnormal weight loss: Secondary | ICD-10-CM | POA: Diagnosis not present

## 2014-03-26 DIAGNOSIS — Z681 Body mass index (BMI) 19 or less, adult: Secondary | ICD-10-CM | POA: Diagnosis not present

## 2014-03-26 DIAGNOSIS — F0280 Dementia in other diseases classified elsewhere without behavioral disturbance: Secondary | ICD-10-CM | POA: Diagnosis not present

## 2014-03-26 DIAGNOSIS — G309 Alzheimer's disease, unspecified: Secondary | ICD-10-CM | POA: Diagnosis not present

## 2014-03-26 DIAGNOSIS — M1990 Unspecified osteoarthritis, unspecified site: Secondary | ICD-10-CM | POA: Diagnosis not present

## 2014-03-26 DIAGNOSIS — J3089 Other allergic rhinitis: Secondary | ICD-10-CM | POA: Diagnosis not present

## 2014-03-27 DIAGNOSIS — R634 Abnormal weight loss: Secondary | ICD-10-CM | POA: Diagnosis not present

## 2014-03-27 DIAGNOSIS — J3089 Other allergic rhinitis: Secondary | ICD-10-CM | POA: Diagnosis not present

## 2014-03-27 DIAGNOSIS — F0280 Dementia in other diseases classified elsewhere without behavioral disturbance: Secondary | ICD-10-CM | POA: Diagnosis not present

## 2014-03-27 DIAGNOSIS — G309 Alzheimer's disease, unspecified: Secondary | ICD-10-CM | POA: Diagnosis not present

## 2014-03-27 DIAGNOSIS — Z681 Body mass index (BMI) 19 or less, adult: Secondary | ICD-10-CM | POA: Diagnosis not present

## 2014-03-27 DIAGNOSIS — M1990 Unspecified osteoarthritis, unspecified site: Secondary | ICD-10-CM | POA: Diagnosis not present

## 2014-03-28 DIAGNOSIS — J3089 Other allergic rhinitis: Secondary | ICD-10-CM | POA: Diagnosis not present

## 2014-03-28 DIAGNOSIS — Z681 Body mass index (BMI) 19 or less, adult: Secondary | ICD-10-CM | POA: Diagnosis not present

## 2014-03-28 DIAGNOSIS — G309 Alzheimer's disease, unspecified: Secondary | ICD-10-CM | POA: Diagnosis not present

## 2014-03-28 DIAGNOSIS — M1990 Unspecified osteoarthritis, unspecified site: Secondary | ICD-10-CM | POA: Diagnosis not present

## 2014-03-28 DIAGNOSIS — F0280 Dementia in other diseases classified elsewhere without behavioral disturbance: Secondary | ICD-10-CM | POA: Diagnosis not present

## 2014-03-28 DIAGNOSIS — R634 Abnormal weight loss: Secondary | ICD-10-CM | POA: Diagnosis not present

## 2014-03-29 DIAGNOSIS — R634 Abnormal weight loss: Secondary | ICD-10-CM | POA: Diagnosis not present

## 2014-03-29 DIAGNOSIS — G309 Alzheimer's disease, unspecified: Secondary | ICD-10-CM | POA: Diagnosis not present

## 2014-03-29 DIAGNOSIS — F0280 Dementia in other diseases classified elsewhere without behavioral disturbance: Secondary | ICD-10-CM | POA: Diagnosis not present

## 2014-03-29 DIAGNOSIS — J3089 Other allergic rhinitis: Secondary | ICD-10-CM | POA: Diagnosis not present

## 2014-03-29 DIAGNOSIS — M1990 Unspecified osteoarthritis, unspecified site: Secondary | ICD-10-CM | POA: Diagnosis not present

## 2014-03-29 DIAGNOSIS — Z681 Body mass index (BMI) 19 or less, adult: Secondary | ICD-10-CM | POA: Diagnosis not present

## 2014-03-30 DIAGNOSIS — Z681 Body mass index (BMI) 19 or less, adult: Secondary | ICD-10-CM | POA: Diagnosis not present

## 2014-03-30 DIAGNOSIS — J3089 Other allergic rhinitis: Secondary | ICD-10-CM | POA: Diagnosis not present

## 2014-03-30 DIAGNOSIS — R634 Abnormal weight loss: Secondary | ICD-10-CM | POA: Diagnosis not present

## 2014-03-30 DIAGNOSIS — M1990 Unspecified osteoarthritis, unspecified site: Secondary | ICD-10-CM | POA: Diagnosis not present

## 2014-03-30 DIAGNOSIS — F0280 Dementia in other diseases classified elsewhere without behavioral disturbance: Secondary | ICD-10-CM | POA: Diagnosis not present

## 2014-03-30 DIAGNOSIS — G309 Alzheimer's disease, unspecified: Secondary | ICD-10-CM | POA: Diagnosis not present

## 2014-03-31 DIAGNOSIS — R634 Abnormal weight loss: Secondary | ICD-10-CM | POA: Diagnosis not present

## 2014-03-31 DIAGNOSIS — M1990 Unspecified osteoarthritis, unspecified site: Secondary | ICD-10-CM | POA: Diagnosis not present

## 2014-03-31 DIAGNOSIS — G309 Alzheimer's disease, unspecified: Secondary | ICD-10-CM | POA: Diagnosis not present

## 2014-03-31 DIAGNOSIS — Z681 Body mass index (BMI) 19 or less, adult: Secondary | ICD-10-CM | POA: Diagnosis not present

## 2014-03-31 DIAGNOSIS — J3089 Other allergic rhinitis: Secondary | ICD-10-CM | POA: Diagnosis not present

## 2014-03-31 DIAGNOSIS — F0280 Dementia in other diseases classified elsewhere without behavioral disturbance: Secondary | ICD-10-CM | POA: Diagnosis not present

## 2014-04-01 DIAGNOSIS — R634 Abnormal weight loss: Secondary | ICD-10-CM | POA: Diagnosis not present

## 2014-04-01 DIAGNOSIS — J3089 Other allergic rhinitis: Secondary | ICD-10-CM | POA: Diagnosis not present

## 2014-04-01 DIAGNOSIS — F0280 Dementia in other diseases classified elsewhere without behavioral disturbance: Secondary | ICD-10-CM | POA: Diagnosis not present

## 2014-04-01 DIAGNOSIS — G309 Alzheimer's disease, unspecified: Secondary | ICD-10-CM | POA: Diagnosis not present

## 2014-04-01 DIAGNOSIS — M1990 Unspecified osteoarthritis, unspecified site: Secondary | ICD-10-CM | POA: Diagnosis not present

## 2014-04-01 DIAGNOSIS — Z681 Body mass index (BMI) 19 or less, adult: Secondary | ICD-10-CM | POA: Diagnosis not present

## 2014-04-02 DIAGNOSIS — F0280 Dementia in other diseases classified elsewhere without behavioral disturbance: Secondary | ICD-10-CM | POA: Diagnosis not present

## 2014-04-02 DIAGNOSIS — M1990 Unspecified osteoarthritis, unspecified site: Secondary | ICD-10-CM | POA: Diagnosis not present

## 2014-04-02 DIAGNOSIS — G309 Alzheimer's disease, unspecified: Secondary | ICD-10-CM | POA: Diagnosis not present

## 2014-04-02 DIAGNOSIS — J3089 Other allergic rhinitis: Secondary | ICD-10-CM | POA: Diagnosis not present

## 2014-04-02 DIAGNOSIS — R634 Abnormal weight loss: Secondary | ICD-10-CM | POA: Diagnosis not present

## 2014-04-02 DIAGNOSIS — Z681 Body mass index (BMI) 19 or less, adult: Secondary | ICD-10-CM | POA: Diagnosis not present

## 2014-04-03 DIAGNOSIS — J3089 Other allergic rhinitis: Secondary | ICD-10-CM | POA: Diagnosis not present

## 2014-04-03 DIAGNOSIS — R634 Abnormal weight loss: Secondary | ICD-10-CM | POA: Diagnosis not present

## 2014-04-03 DIAGNOSIS — Z681 Body mass index (BMI) 19 or less, adult: Secondary | ICD-10-CM | POA: Diagnosis not present

## 2014-04-03 DIAGNOSIS — M1990 Unspecified osteoarthritis, unspecified site: Secondary | ICD-10-CM | POA: Diagnosis not present

## 2014-04-03 DIAGNOSIS — G309 Alzheimer's disease, unspecified: Secondary | ICD-10-CM | POA: Diagnosis not present

## 2014-04-03 DIAGNOSIS — F0280 Dementia in other diseases classified elsewhere without behavioral disturbance: Secondary | ICD-10-CM | POA: Diagnosis not present

## 2014-04-04 DIAGNOSIS — J3089 Other allergic rhinitis: Secondary | ICD-10-CM | POA: Diagnosis not present

## 2014-04-04 DIAGNOSIS — R634 Abnormal weight loss: Secondary | ICD-10-CM | POA: Diagnosis not present

## 2014-04-04 DIAGNOSIS — G309 Alzheimer's disease, unspecified: Secondary | ICD-10-CM | POA: Diagnosis not present

## 2014-04-04 DIAGNOSIS — Z681 Body mass index (BMI) 19 or less, adult: Secondary | ICD-10-CM | POA: Diagnosis not present

## 2014-04-04 DIAGNOSIS — M1990 Unspecified osteoarthritis, unspecified site: Secondary | ICD-10-CM | POA: Diagnosis not present

## 2014-04-04 DIAGNOSIS — F0280 Dementia in other diseases classified elsewhere without behavioral disturbance: Secondary | ICD-10-CM | POA: Diagnosis not present

## 2014-04-05 DIAGNOSIS — M1990 Unspecified osteoarthritis, unspecified site: Secondary | ICD-10-CM | POA: Diagnosis not present

## 2014-04-05 DIAGNOSIS — J3089 Other allergic rhinitis: Secondary | ICD-10-CM | POA: Diagnosis not present

## 2014-04-05 DIAGNOSIS — G309 Alzheimer's disease, unspecified: Secondary | ICD-10-CM | POA: Diagnosis not present

## 2014-04-05 DIAGNOSIS — R634 Abnormal weight loss: Secondary | ICD-10-CM | POA: Diagnosis not present

## 2014-04-05 DIAGNOSIS — Z681 Body mass index (BMI) 19 or less, adult: Secondary | ICD-10-CM | POA: Diagnosis not present

## 2014-04-05 DIAGNOSIS — F0280 Dementia in other diseases classified elsewhere without behavioral disturbance: Secondary | ICD-10-CM | POA: Diagnosis not present

## 2014-04-06 DIAGNOSIS — J3089 Other allergic rhinitis: Secondary | ICD-10-CM | POA: Diagnosis not present

## 2014-04-06 DIAGNOSIS — Z681 Body mass index (BMI) 19 or less, adult: Secondary | ICD-10-CM | POA: Diagnosis not present

## 2014-04-06 DIAGNOSIS — R634 Abnormal weight loss: Secondary | ICD-10-CM | POA: Diagnosis not present

## 2014-04-06 DIAGNOSIS — G309 Alzheimer's disease, unspecified: Secondary | ICD-10-CM | POA: Diagnosis not present

## 2014-04-06 DIAGNOSIS — M1990 Unspecified osteoarthritis, unspecified site: Secondary | ICD-10-CM | POA: Diagnosis not present

## 2014-04-06 DIAGNOSIS — F0280 Dementia in other diseases classified elsewhere without behavioral disturbance: Secondary | ICD-10-CM | POA: Diagnosis not present

## 2014-04-07 DIAGNOSIS — Z681 Body mass index (BMI) 19 or less, adult: Secondary | ICD-10-CM | POA: Diagnosis not present

## 2014-04-07 DIAGNOSIS — R634 Abnormal weight loss: Secondary | ICD-10-CM | POA: Diagnosis not present

## 2014-04-07 DIAGNOSIS — M1990 Unspecified osteoarthritis, unspecified site: Secondary | ICD-10-CM | POA: Diagnosis not present

## 2014-04-07 DIAGNOSIS — J3089 Other allergic rhinitis: Secondary | ICD-10-CM | POA: Diagnosis not present

## 2014-04-07 DIAGNOSIS — F0280 Dementia in other diseases classified elsewhere without behavioral disturbance: Secondary | ICD-10-CM | POA: Diagnosis not present

## 2014-04-07 DIAGNOSIS — G309 Alzheimer's disease, unspecified: Secondary | ICD-10-CM | POA: Diagnosis not present

## 2014-04-08 DIAGNOSIS — M1990 Unspecified osteoarthritis, unspecified site: Secondary | ICD-10-CM | POA: Diagnosis not present

## 2014-04-08 DIAGNOSIS — F0280 Dementia in other diseases classified elsewhere without behavioral disturbance: Secondary | ICD-10-CM | POA: Diagnosis not present

## 2014-04-08 DIAGNOSIS — G309 Alzheimer's disease, unspecified: Secondary | ICD-10-CM | POA: Diagnosis not present

## 2014-04-08 DIAGNOSIS — Z681 Body mass index (BMI) 19 or less, adult: Secondary | ICD-10-CM | POA: Diagnosis not present

## 2014-04-08 DIAGNOSIS — J3089 Other allergic rhinitis: Secondary | ICD-10-CM | POA: Diagnosis not present

## 2014-04-08 DIAGNOSIS — R634 Abnormal weight loss: Secondary | ICD-10-CM | POA: Diagnosis not present

## 2014-04-09 DIAGNOSIS — R634 Abnormal weight loss: Secondary | ICD-10-CM | POA: Diagnosis not present

## 2014-04-09 DIAGNOSIS — F0280 Dementia in other diseases classified elsewhere without behavioral disturbance: Secondary | ICD-10-CM | POA: Diagnosis not present

## 2014-04-09 DIAGNOSIS — J3089 Other allergic rhinitis: Secondary | ICD-10-CM | POA: Diagnosis not present

## 2014-04-09 DIAGNOSIS — G309 Alzheimer's disease, unspecified: Secondary | ICD-10-CM | POA: Diagnosis not present

## 2014-04-09 DIAGNOSIS — Z681 Body mass index (BMI) 19 or less, adult: Secondary | ICD-10-CM | POA: Diagnosis not present

## 2014-04-09 DIAGNOSIS — M1990 Unspecified osteoarthritis, unspecified site: Secondary | ICD-10-CM | POA: Diagnosis not present

## 2014-04-10 DIAGNOSIS — J3089 Other allergic rhinitis: Secondary | ICD-10-CM | POA: Diagnosis not present

## 2014-04-10 DIAGNOSIS — M1990 Unspecified osteoarthritis, unspecified site: Secondary | ICD-10-CM | POA: Diagnosis not present

## 2014-04-10 DIAGNOSIS — G309 Alzheimer's disease, unspecified: Secondary | ICD-10-CM | POA: Diagnosis not present

## 2014-04-10 DIAGNOSIS — R634 Abnormal weight loss: Secondary | ICD-10-CM | POA: Diagnosis not present

## 2014-04-10 DIAGNOSIS — Z681 Body mass index (BMI) 19 or less, adult: Secondary | ICD-10-CM | POA: Diagnosis not present

## 2014-04-10 DIAGNOSIS — F0280 Dementia in other diseases classified elsewhere without behavioral disturbance: Secondary | ICD-10-CM | POA: Diagnosis not present

## 2014-04-11 DIAGNOSIS — G309 Alzheimer's disease, unspecified: Secondary | ICD-10-CM | POA: Diagnosis not present

## 2014-04-11 DIAGNOSIS — M1990 Unspecified osteoarthritis, unspecified site: Secondary | ICD-10-CM | POA: Diagnosis not present

## 2014-04-11 DIAGNOSIS — R634 Abnormal weight loss: Secondary | ICD-10-CM | POA: Diagnosis not present

## 2014-04-11 DIAGNOSIS — F0280 Dementia in other diseases classified elsewhere without behavioral disturbance: Secondary | ICD-10-CM | POA: Diagnosis not present

## 2014-04-11 DIAGNOSIS — J3089 Other allergic rhinitis: Secondary | ICD-10-CM | POA: Diagnosis not present

## 2014-04-11 DIAGNOSIS — Z681 Body mass index (BMI) 19 or less, adult: Secondary | ICD-10-CM | POA: Diagnosis not present

## 2014-04-12 DIAGNOSIS — R634 Abnormal weight loss: Secondary | ICD-10-CM | POA: Diagnosis not present

## 2014-04-12 DIAGNOSIS — G309 Alzheimer's disease, unspecified: Secondary | ICD-10-CM | POA: Diagnosis not present

## 2014-04-12 DIAGNOSIS — Z681 Body mass index (BMI) 19 or less, adult: Secondary | ICD-10-CM | POA: Diagnosis not present

## 2014-04-12 DIAGNOSIS — F0280 Dementia in other diseases classified elsewhere without behavioral disturbance: Secondary | ICD-10-CM | POA: Diagnosis not present

## 2014-04-12 DIAGNOSIS — J3089 Other allergic rhinitis: Secondary | ICD-10-CM | POA: Diagnosis not present

## 2014-04-12 DIAGNOSIS — M1990 Unspecified osteoarthritis, unspecified site: Secondary | ICD-10-CM | POA: Diagnosis not present

## 2014-04-13 DIAGNOSIS — F0280 Dementia in other diseases classified elsewhere without behavioral disturbance: Secondary | ICD-10-CM | POA: Diagnosis not present

## 2014-04-13 DIAGNOSIS — J3089 Other allergic rhinitis: Secondary | ICD-10-CM | POA: Diagnosis not present

## 2014-04-13 DIAGNOSIS — Z681 Body mass index (BMI) 19 or less, adult: Secondary | ICD-10-CM | POA: Diagnosis not present

## 2014-04-13 DIAGNOSIS — R634 Abnormal weight loss: Secondary | ICD-10-CM | POA: Diagnosis not present

## 2014-04-13 DIAGNOSIS — M1990 Unspecified osteoarthritis, unspecified site: Secondary | ICD-10-CM | POA: Diagnosis not present

## 2014-04-13 DIAGNOSIS — G309 Alzheimer's disease, unspecified: Secondary | ICD-10-CM | POA: Diagnosis not present

## 2014-04-14 DIAGNOSIS — J3089 Other allergic rhinitis: Secondary | ICD-10-CM | POA: Diagnosis not present

## 2014-04-14 DIAGNOSIS — Z681 Body mass index (BMI) 19 or less, adult: Secondary | ICD-10-CM | POA: Diagnosis not present

## 2014-04-14 DIAGNOSIS — M1990 Unspecified osteoarthritis, unspecified site: Secondary | ICD-10-CM | POA: Diagnosis not present

## 2014-04-14 DIAGNOSIS — G309 Alzheimer's disease, unspecified: Secondary | ICD-10-CM | POA: Diagnosis not present

## 2014-04-14 DIAGNOSIS — R634 Abnormal weight loss: Secondary | ICD-10-CM | POA: Diagnosis not present

## 2014-04-14 DIAGNOSIS — F0280 Dementia in other diseases classified elsewhere without behavioral disturbance: Secondary | ICD-10-CM | POA: Diagnosis not present

## 2014-04-15 DIAGNOSIS — R634 Abnormal weight loss: Secondary | ICD-10-CM | POA: Diagnosis not present

## 2014-04-15 DIAGNOSIS — G309 Alzheimer's disease, unspecified: Secondary | ICD-10-CM | POA: Diagnosis not present

## 2014-04-15 DIAGNOSIS — Z681 Body mass index (BMI) 19 or less, adult: Secondary | ICD-10-CM | POA: Diagnosis not present

## 2014-04-15 DIAGNOSIS — M1990 Unspecified osteoarthritis, unspecified site: Secondary | ICD-10-CM | POA: Diagnosis not present

## 2014-04-15 DIAGNOSIS — F0280 Dementia in other diseases classified elsewhere without behavioral disturbance: Secondary | ICD-10-CM | POA: Diagnosis not present

## 2014-04-15 DIAGNOSIS — J3089 Other allergic rhinitis: Secondary | ICD-10-CM | POA: Diagnosis not present

## 2014-04-16 DIAGNOSIS — M1990 Unspecified osteoarthritis, unspecified site: Secondary | ICD-10-CM | POA: Diagnosis not present

## 2014-04-16 DIAGNOSIS — G309 Alzheimer's disease, unspecified: Secondary | ICD-10-CM | POA: Diagnosis not present

## 2014-04-16 DIAGNOSIS — F0280 Dementia in other diseases classified elsewhere without behavioral disturbance: Secondary | ICD-10-CM | POA: Diagnosis not present

## 2014-04-16 DIAGNOSIS — J3089 Other allergic rhinitis: Secondary | ICD-10-CM | POA: Diagnosis not present

## 2014-04-16 DIAGNOSIS — Z681 Body mass index (BMI) 19 or less, adult: Secondary | ICD-10-CM | POA: Diagnosis not present

## 2014-04-16 DIAGNOSIS — R634 Abnormal weight loss: Secondary | ICD-10-CM | POA: Diagnosis not present

## 2014-04-17 DIAGNOSIS — M1990 Unspecified osteoarthritis, unspecified site: Secondary | ICD-10-CM | POA: Diagnosis not present

## 2014-04-17 DIAGNOSIS — Z681 Body mass index (BMI) 19 or less, adult: Secondary | ICD-10-CM | POA: Diagnosis not present

## 2014-04-17 DIAGNOSIS — F0280 Dementia in other diseases classified elsewhere without behavioral disturbance: Secondary | ICD-10-CM | POA: Diagnosis not present

## 2014-04-17 DIAGNOSIS — R634 Abnormal weight loss: Secondary | ICD-10-CM | POA: Diagnosis not present

## 2014-04-17 DIAGNOSIS — G309 Alzheimer's disease, unspecified: Secondary | ICD-10-CM | POA: Diagnosis not present

## 2014-04-17 DIAGNOSIS — J3089 Other allergic rhinitis: Secondary | ICD-10-CM | POA: Diagnosis not present

## 2014-04-18 DIAGNOSIS — J3089 Other allergic rhinitis: Secondary | ICD-10-CM | POA: Diagnosis not present

## 2014-04-18 DIAGNOSIS — F0280 Dementia in other diseases classified elsewhere without behavioral disturbance: Secondary | ICD-10-CM | POA: Diagnosis not present

## 2014-04-18 DIAGNOSIS — Z681 Body mass index (BMI) 19 or less, adult: Secondary | ICD-10-CM | POA: Diagnosis not present

## 2014-04-18 DIAGNOSIS — G309 Alzheimer's disease, unspecified: Secondary | ICD-10-CM | POA: Diagnosis not present

## 2014-04-18 DIAGNOSIS — R634 Abnormal weight loss: Secondary | ICD-10-CM | POA: Diagnosis not present

## 2014-04-18 DIAGNOSIS — M1990 Unspecified osteoarthritis, unspecified site: Secondary | ICD-10-CM | POA: Diagnosis not present

## 2014-04-19 DIAGNOSIS — F0280 Dementia in other diseases classified elsewhere without behavioral disturbance: Secondary | ICD-10-CM | POA: Diagnosis not present

## 2014-04-19 DIAGNOSIS — J3089 Other allergic rhinitis: Secondary | ICD-10-CM | POA: Diagnosis not present

## 2014-04-19 DIAGNOSIS — Z681 Body mass index (BMI) 19 or less, adult: Secondary | ICD-10-CM | POA: Diagnosis not present

## 2014-04-19 DIAGNOSIS — R634 Abnormal weight loss: Secondary | ICD-10-CM | POA: Diagnosis not present

## 2014-04-19 DIAGNOSIS — M1990 Unspecified osteoarthritis, unspecified site: Secondary | ICD-10-CM | POA: Diagnosis not present

## 2014-04-19 DIAGNOSIS — G309 Alzheimer's disease, unspecified: Secondary | ICD-10-CM | POA: Diagnosis not present

## 2014-04-20 DIAGNOSIS — F0280 Dementia in other diseases classified elsewhere without behavioral disturbance: Secondary | ICD-10-CM | POA: Diagnosis not present

## 2014-04-20 DIAGNOSIS — Z681 Body mass index (BMI) 19 or less, adult: Secondary | ICD-10-CM | POA: Diagnosis not present

## 2014-04-20 DIAGNOSIS — G309 Alzheimer's disease, unspecified: Secondary | ICD-10-CM | POA: Diagnosis not present

## 2014-04-20 DIAGNOSIS — R634 Abnormal weight loss: Secondary | ICD-10-CM | POA: Diagnosis not present

## 2014-04-20 DIAGNOSIS — J3089 Other allergic rhinitis: Secondary | ICD-10-CM | POA: Diagnosis not present

## 2014-04-20 DIAGNOSIS — M1990 Unspecified osteoarthritis, unspecified site: Secondary | ICD-10-CM | POA: Diagnosis not present

## 2014-04-21 DIAGNOSIS — G309 Alzheimer's disease, unspecified: Secondary | ICD-10-CM | POA: Diagnosis not present

## 2014-04-21 DIAGNOSIS — R634 Abnormal weight loss: Secondary | ICD-10-CM | POA: Diagnosis not present

## 2014-04-21 DIAGNOSIS — Z681 Body mass index (BMI) 19 or less, adult: Secondary | ICD-10-CM | POA: Diagnosis not present

## 2014-04-21 DIAGNOSIS — J3089 Other allergic rhinitis: Secondary | ICD-10-CM | POA: Diagnosis not present

## 2014-04-21 DIAGNOSIS — F0280 Dementia in other diseases classified elsewhere without behavioral disturbance: Secondary | ICD-10-CM | POA: Diagnosis not present

## 2014-04-21 DIAGNOSIS — M1990 Unspecified osteoarthritis, unspecified site: Secondary | ICD-10-CM | POA: Diagnosis not present

## 2014-04-22 DIAGNOSIS — Z681 Body mass index (BMI) 19 or less, adult: Secondary | ICD-10-CM | POA: Diagnosis not present

## 2014-04-22 DIAGNOSIS — G309 Alzheimer's disease, unspecified: Secondary | ICD-10-CM | POA: Diagnosis not present

## 2014-04-22 DIAGNOSIS — J3089 Other allergic rhinitis: Secondary | ICD-10-CM | POA: Diagnosis not present

## 2014-04-22 DIAGNOSIS — R634 Abnormal weight loss: Secondary | ICD-10-CM | POA: Diagnosis not present

## 2014-04-22 DIAGNOSIS — F0280 Dementia in other diseases classified elsewhere without behavioral disturbance: Secondary | ICD-10-CM | POA: Diagnosis not present

## 2014-04-22 DIAGNOSIS — M1990 Unspecified osteoarthritis, unspecified site: Secondary | ICD-10-CM | POA: Diagnosis not present

## 2014-04-23 DIAGNOSIS — G309 Alzheimer's disease, unspecified: Secondary | ICD-10-CM | POA: Diagnosis not present

## 2014-04-23 DIAGNOSIS — F0280 Dementia in other diseases classified elsewhere without behavioral disturbance: Secondary | ICD-10-CM | POA: Diagnosis not present

## 2014-04-23 DIAGNOSIS — M1990 Unspecified osteoarthritis, unspecified site: Secondary | ICD-10-CM | POA: Diagnosis not present

## 2014-04-23 DIAGNOSIS — J3089 Other allergic rhinitis: Secondary | ICD-10-CM | POA: Diagnosis not present

## 2014-04-23 DIAGNOSIS — Z681 Body mass index (BMI) 19 or less, adult: Secondary | ICD-10-CM | POA: Diagnosis not present

## 2014-04-23 DIAGNOSIS — R634 Abnormal weight loss: Secondary | ICD-10-CM | POA: Diagnosis not present

## 2014-04-24 DIAGNOSIS — G309 Alzheimer's disease, unspecified: Secondary | ICD-10-CM | POA: Diagnosis not present

## 2014-04-24 DIAGNOSIS — Z681 Body mass index (BMI) 19 or less, adult: Secondary | ICD-10-CM | POA: Diagnosis not present

## 2014-04-24 DIAGNOSIS — R634 Abnormal weight loss: Secondary | ICD-10-CM | POA: Diagnosis not present

## 2014-04-24 DIAGNOSIS — M1990 Unspecified osteoarthritis, unspecified site: Secondary | ICD-10-CM | POA: Diagnosis not present

## 2014-04-24 DIAGNOSIS — J3089 Other allergic rhinitis: Secondary | ICD-10-CM | POA: Diagnosis not present

## 2014-04-24 DIAGNOSIS — F0280 Dementia in other diseases classified elsewhere without behavioral disturbance: Secondary | ICD-10-CM | POA: Diagnosis not present

## 2014-04-25 DIAGNOSIS — R634 Abnormal weight loss: Secondary | ICD-10-CM | POA: Diagnosis not present

## 2014-04-25 DIAGNOSIS — J3089 Other allergic rhinitis: Secondary | ICD-10-CM | POA: Diagnosis not present

## 2014-04-25 DIAGNOSIS — F0280 Dementia in other diseases classified elsewhere without behavioral disturbance: Secondary | ICD-10-CM | POA: Diagnosis not present

## 2014-04-25 DIAGNOSIS — Z681 Body mass index (BMI) 19 or less, adult: Secondary | ICD-10-CM | POA: Diagnosis not present

## 2014-04-25 DIAGNOSIS — G309 Alzheimer's disease, unspecified: Secondary | ICD-10-CM | POA: Diagnosis not present

## 2014-04-25 DIAGNOSIS — M1990 Unspecified osteoarthritis, unspecified site: Secondary | ICD-10-CM | POA: Diagnosis not present

## 2014-04-26 DIAGNOSIS — F0280 Dementia in other diseases classified elsewhere without behavioral disturbance: Secondary | ICD-10-CM | POA: Diagnosis not present

## 2014-04-26 DIAGNOSIS — J3089 Other allergic rhinitis: Secondary | ICD-10-CM | POA: Diagnosis not present

## 2014-04-26 DIAGNOSIS — Z681 Body mass index (BMI) 19 or less, adult: Secondary | ICD-10-CM | POA: Diagnosis not present

## 2014-04-26 DIAGNOSIS — G309 Alzheimer's disease, unspecified: Secondary | ICD-10-CM | POA: Diagnosis not present

## 2014-04-26 DIAGNOSIS — M1990 Unspecified osteoarthritis, unspecified site: Secondary | ICD-10-CM | POA: Diagnosis not present

## 2014-04-26 DIAGNOSIS — R634 Abnormal weight loss: Secondary | ICD-10-CM | POA: Diagnosis not present

## 2014-04-27 DIAGNOSIS — G309 Alzheimer's disease, unspecified: Secondary | ICD-10-CM | POA: Diagnosis not present

## 2014-04-27 DIAGNOSIS — Z681 Body mass index (BMI) 19 or less, adult: Secondary | ICD-10-CM | POA: Diagnosis not present

## 2014-04-27 DIAGNOSIS — M1990 Unspecified osteoarthritis, unspecified site: Secondary | ICD-10-CM | POA: Diagnosis not present

## 2014-04-27 DIAGNOSIS — F0280 Dementia in other diseases classified elsewhere without behavioral disturbance: Secondary | ICD-10-CM | POA: Diagnosis not present

## 2014-04-27 DIAGNOSIS — J3089 Other allergic rhinitis: Secondary | ICD-10-CM | POA: Diagnosis not present

## 2014-04-27 DIAGNOSIS — R634 Abnormal weight loss: Secondary | ICD-10-CM | POA: Diagnosis not present

## 2014-04-28 DIAGNOSIS — G309 Alzheimer's disease, unspecified: Secondary | ICD-10-CM | POA: Diagnosis not present

## 2014-04-28 DIAGNOSIS — F0280 Dementia in other diseases classified elsewhere without behavioral disturbance: Secondary | ICD-10-CM | POA: Diagnosis not present

## 2014-04-28 DIAGNOSIS — M1990 Unspecified osteoarthritis, unspecified site: Secondary | ICD-10-CM | POA: Diagnosis not present

## 2014-04-28 DIAGNOSIS — Z681 Body mass index (BMI) 19 or less, adult: Secondary | ICD-10-CM | POA: Diagnosis not present

## 2014-04-28 DIAGNOSIS — J3089 Other allergic rhinitis: Secondary | ICD-10-CM | POA: Diagnosis not present

## 2014-04-28 DIAGNOSIS — R634 Abnormal weight loss: Secondary | ICD-10-CM | POA: Diagnosis not present

## 2014-04-29 DIAGNOSIS — Z681 Body mass index (BMI) 19 or less, adult: Secondary | ICD-10-CM | POA: Diagnosis not present

## 2014-04-29 DIAGNOSIS — F0280 Dementia in other diseases classified elsewhere without behavioral disturbance: Secondary | ICD-10-CM | POA: Diagnosis not present

## 2014-04-29 DIAGNOSIS — M1990 Unspecified osteoarthritis, unspecified site: Secondary | ICD-10-CM | POA: Diagnosis not present

## 2014-04-29 DIAGNOSIS — G309 Alzheimer's disease, unspecified: Secondary | ICD-10-CM | POA: Diagnosis not present

## 2014-04-29 DIAGNOSIS — R634 Abnormal weight loss: Secondary | ICD-10-CM | POA: Diagnosis not present

## 2014-04-29 DIAGNOSIS — J3089 Other allergic rhinitis: Secondary | ICD-10-CM | POA: Diagnosis not present

## 2014-04-30 DIAGNOSIS — G309 Alzheimer's disease, unspecified: Secondary | ICD-10-CM | POA: Diagnosis not present

## 2014-04-30 DIAGNOSIS — M1990 Unspecified osteoarthritis, unspecified site: Secondary | ICD-10-CM | POA: Diagnosis not present

## 2014-04-30 DIAGNOSIS — J3089 Other allergic rhinitis: Secondary | ICD-10-CM | POA: Diagnosis not present

## 2014-04-30 DIAGNOSIS — Z681 Body mass index (BMI) 19 or less, adult: Secondary | ICD-10-CM | POA: Diagnosis not present

## 2014-04-30 DIAGNOSIS — F0280 Dementia in other diseases classified elsewhere without behavioral disturbance: Secondary | ICD-10-CM | POA: Diagnosis not present

## 2014-04-30 DIAGNOSIS — R634 Abnormal weight loss: Secondary | ICD-10-CM | POA: Diagnosis not present

## 2014-05-01 DIAGNOSIS — F0280 Dementia in other diseases classified elsewhere without behavioral disturbance: Secondary | ICD-10-CM | POA: Diagnosis not present

## 2014-05-01 DIAGNOSIS — J3089 Other allergic rhinitis: Secondary | ICD-10-CM | POA: Diagnosis not present

## 2014-05-01 DIAGNOSIS — M1990 Unspecified osteoarthritis, unspecified site: Secondary | ICD-10-CM | POA: Diagnosis not present

## 2014-05-01 DIAGNOSIS — R634 Abnormal weight loss: Secondary | ICD-10-CM | POA: Diagnosis not present

## 2014-05-01 DIAGNOSIS — Z681 Body mass index (BMI) 19 or less, adult: Secondary | ICD-10-CM | POA: Diagnosis not present

## 2014-05-01 DIAGNOSIS — G309 Alzheimer's disease, unspecified: Secondary | ICD-10-CM | POA: Diagnosis not present

## 2014-05-02 DIAGNOSIS — M1990 Unspecified osteoarthritis, unspecified site: Secondary | ICD-10-CM | POA: Diagnosis not present

## 2014-05-02 DIAGNOSIS — G309 Alzheimer's disease, unspecified: Secondary | ICD-10-CM | POA: Diagnosis not present

## 2014-05-02 DIAGNOSIS — Z681 Body mass index (BMI) 19 or less, adult: Secondary | ICD-10-CM | POA: Diagnosis not present

## 2014-05-02 DIAGNOSIS — J3089 Other allergic rhinitis: Secondary | ICD-10-CM | POA: Diagnosis not present

## 2014-05-02 DIAGNOSIS — F0280 Dementia in other diseases classified elsewhere without behavioral disturbance: Secondary | ICD-10-CM | POA: Diagnosis not present

## 2014-05-02 DIAGNOSIS — R634 Abnormal weight loss: Secondary | ICD-10-CM | POA: Diagnosis not present

## 2014-05-03 DIAGNOSIS — G309 Alzheimer's disease, unspecified: Secondary | ICD-10-CM | POA: Diagnosis not present

## 2014-05-03 DIAGNOSIS — R634 Abnormal weight loss: Secondary | ICD-10-CM | POA: Diagnosis not present

## 2014-05-03 DIAGNOSIS — J3089 Other allergic rhinitis: Secondary | ICD-10-CM | POA: Diagnosis not present

## 2014-05-03 DIAGNOSIS — F0280 Dementia in other diseases classified elsewhere without behavioral disturbance: Secondary | ICD-10-CM | POA: Diagnosis not present

## 2014-05-03 DIAGNOSIS — M1990 Unspecified osteoarthritis, unspecified site: Secondary | ICD-10-CM | POA: Diagnosis not present

## 2014-05-03 DIAGNOSIS — Z681 Body mass index (BMI) 19 or less, adult: Secondary | ICD-10-CM | POA: Diagnosis not present

## 2014-05-04 DIAGNOSIS — R634 Abnormal weight loss: Secondary | ICD-10-CM | POA: Diagnosis not present

## 2014-05-04 DIAGNOSIS — G309 Alzheimer's disease, unspecified: Secondary | ICD-10-CM | POA: Diagnosis not present

## 2014-05-04 DIAGNOSIS — Z681 Body mass index (BMI) 19 or less, adult: Secondary | ICD-10-CM | POA: Diagnosis not present

## 2014-05-04 DIAGNOSIS — J3089 Other allergic rhinitis: Secondary | ICD-10-CM | POA: Diagnosis not present

## 2014-05-04 DIAGNOSIS — F0280 Dementia in other diseases classified elsewhere without behavioral disturbance: Secondary | ICD-10-CM | POA: Diagnosis not present

## 2014-05-04 DIAGNOSIS — M1990 Unspecified osteoarthritis, unspecified site: Secondary | ICD-10-CM | POA: Diagnosis not present

## 2014-05-05 DIAGNOSIS — J3089 Other allergic rhinitis: Secondary | ICD-10-CM | POA: Diagnosis not present

## 2014-05-05 DIAGNOSIS — M1990 Unspecified osteoarthritis, unspecified site: Secondary | ICD-10-CM | POA: Diagnosis not present

## 2014-05-05 DIAGNOSIS — F0280 Dementia in other diseases classified elsewhere without behavioral disturbance: Secondary | ICD-10-CM | POA: Diagnosis not present

## 2014-05-05 DIAGNOSIS — G309 Alzheimer's disease, unspecified: Secondary | ICD-10-CM | POA: Diagnosis not present

## 2014-05-05 DIAGNOSIS — Z681 Body mass index (BMI) 19 or less, adult: Secondary | ICD-10-CM | POA: Diagnosis not present

## 2014-05-05 DIAGNOSIS — R634 Abnormal weight loss: Secondary | ICD-10-CM | POA: Diagnosis not present

## 2014-05-06 ENCOUNTER — Telehealth: Payer: Self-pay

## 2014-05-06 DIAGNOSIS — J3089 Other allergic rhinitis: Secondary | ICD-10-CM | POA: Diagnosis not present

## 2014-05-06 DIAGNOSIS — F0280 Dementia in other diseases classified elsewhere without behavioral disturbance: Secondary | ICD-10-CM | POA: Diagnosis not present

## 2014-05-06 DIAGNOSIS — Z681 Body mass index (BMI) 19 or less, adult: Secondary | ICD-10-CM | POA: Diagnosis not present

## 2014-05-06 DIAGNOSIS — G309 Alzheimer's disease, unspecified: Secondary | ICD-10-CM | POA: Diagnosis not present

## 2014-05-06 DIAGNOSIS — R634 Abnormal weight loss: Secondary | ICD-10-CM | POA: Diagnosis not present

## 2014-05-06 DIAGNOSIS — M1990 Unspecified osteoarthritis, unspecified site: Secondary | ICD-10-CM | POA: Diagnosis not present

## 2014-05-06 NOTE — Telephone Encounter (Signed)
Baxter Flattery with [facility] said Hospice nurse request written order for Endit cream faxed to [phone].Please advise.

## 2014-05-07 DIAGNOSIS — R634 Abnormal weight loss: Secondary | ICD-10-CM | POA: Diagnosis not present

## 2014-05-07 DIAGNOSIS — M1990 Unspecified osteoarthritis, unspecified site: Secondary | ICD-10-CM | POA: Diagnosis not present

## 2014-05-07 DIAGNOSIS — J3089 Other allergic rhinitis: Secondary | ICD-10-CM | POA: Diagnosis not present

## 2014-05-07 DIAGNOSIS — Z681 Body mass index (BMI) 19 or less, adult: Secondary | ICD-10-CM | POA: Diagnosis not present

## 2014-05-07 DIAGNOSIS — F0280 Dementia in other diseases classified elsewhere without behavioral disturbance: Secondary | ICD-10-CM | POA: Diagnosis not present

## 2014-05-07 DIAGNOSIS — G309 Alzheimer's disease, unspecified: Secondary | ICD-10-CM | POA: Diagnosis not present

## 2014-05-07 NOTE — Telephone Encounter (Signed)
Order signed.

## 2014-05-08 DIAGNOSIS — R634 Abnormal weight loss: Secondary | ICD-10-CM | POA: Diagnosis not present

## 2014-05-08 DIAGNOSIS — M1990 Unspecified osteoarthritis, unspecified site: Secondary | ICD-10-CM | POA: Diagnosis not present

## 2014-05-08 DIAGNOSIS — Z681 Body mass index (BMI) 19 or less, adult: Secondary | ICD-10-CM | POA: Diagnosis not present

## 2014-05-08 DIAGNOSIS — J3089 Other allergic rhinitis: Secondary | ICD-10-CM | POA: Diagnosis not present

## 2014-05-08 DIAGNOSIS — G309 Alzheimer's disease, unspecified: Secondary | ICD-10-CM | POA: Diagnosis not present

## 2014-05-08 DIAGNOSIS — F0280 Dementia in other diseases classified elsewhere without behavioral disturbance: Secondary | ICD-10-CM | POA: Diagnosis not present

## 2014-05-09 DIAGNOSIS — M1990 Unspecified osteoarthritis, unspecified site: Secondary | ICD-10-CM | POA: Diagnosis not present

## 2014-05-09 DIAGNOSIS — F0280 Dementia in other diseases classified elsewhere without behavioral disturbance: Secondary | ICD-10-CM | POA: Diagnosis not present

## 2014-05-09 DIAGNOSIS — J3089 Other allergic rhinitis: Secondary | ICD-10-CM | POA: Diagnosis not present

## 2014-05-09 DIAGNOSIS — R634 Abnormal weight loss: Secondary | ICD-10-CM | POA: Diagnosis not present

## 2014-05-09 DIAGNOSIS — Z681 Body mass index (BMI) 19 or less, adult: Secondary | ICD-10-CM | POA: Diagnosis not present

## 2014-05-09 DIAGNOSIS — G309 Alzheimer's disease, unspecified: Secondary | ICD-10-CM | POA: Diagnosis not present

## 2014-05-10 DIAGNOSIS — M1990 Unspecified osteoarthritis, unspecified site: Secondary | ICD-10-CM | POA: Diagnosis not present

## 2014-05-10 DIAGNOSIS — Z681 Body mass index (BMI) 19 or less, adult: Secondary | ICD-10-CM | POA: Diagnosis not present

## 2014-05-10 DIAGNOSIS — J3089 Other allergic rhinitis: Secondary | ICD-10-CM | POA: Diagnosis not present

## 2014-05-10 DIAGNOSIS — F0280 Dementia in other diseases classified elsewhere without behavioral disturbance: Secondary | ICD-10-CM | POA: Diagnosis not present

## 2014-05-10 DIAGNOSIS — R634 Abnormal weight loss: Secondary | ICD-10-CM | POA: Diagnosis not present

## 2014-05-10 DIAGNOSIS — G309 Alzheimer's disease, unspecified: Secondary | ICD-10-CM | POA: Diagnosis not present

## 2014-05-11 DIAGNOSIS — M1990 Unspecified osteoarthritis, unspecified site: Secondary | ICD-10-CM | POA: Diagnosis not present

## 2014-05-11 DIAGNOSIS — F0280 Dementia in other diseases classified elsewhere without behavioral disturbance: Secondary | ICD-10-CM | POA: Diagnosis not present

## 2014-05-11 DIAGNOSIS — Z681 Body mass index (BMI) 19 or less, adult: Secondary | ICD-10-CM | POA: Diagnosis not present

## 2014-05-11 DIAGNOSIS — J3089 Other allergic rhinitis: Secondary | ICD-10-CM | POA: Diagnosis not present

## 2014-05-11 DIAGNOSIS — G309 Alzheimer's disease, unspecified: Secondary | ICD-10-CM | POA: Diagnosis not present

## 2014-05-11 DIAGNOSIS — R634 Abnormal weight loss: Secondary | ICD-10-CM | POA: Diagnosis not present

## 2014-05-12 DIAGNOSIS — F0280 Dementia in other diseases classified elsewhere without behavioral disturbance: Secondary | ICD-10-CM | POA: Diagnosis not present

## 2014-05-12 DIAGNOSIS — G309 Alzheimer's disease, unspecified: Secondary | ICD-10-CM | POA: Diagnosis not present

## 2014-05-12 DIAGNOSIS — J3089 Other allergic rhinitis: Secondary | ICD-10-CM | POA: Diagnosis not present

## 2014-05-12 DIAGNOSIS — R634 Abnormal weight loss: Secondary | ICD-10-CM | POA: Diagnosis not present

## 2014-05-12 DIAGNOSIS — Z681 Body mass index (BMI) 19 or less, adult: Secondary | ICD-10-CM | POA: Diagnosis not present

## 2014-05-12 DIAGNOSIS — M1990 Unspecified osteoarthritis, unspecified site: Secondary | ICD-10-CM | POA: Diagnosis not present

## 2014-05-13 DIAGNOSIS — R634 Abnormal weight loss: Secondary | ICD-10-CM | POA: Diagnosis not present

## 2014-05-13 DIAGNOSIS — M1990 Unspecified osteoarthritis, unspecified site: Secondary | ICD-10-CM | POA: Diagnosis not present

## 2014-05-13 DIAGNOSIS — J3089 Other allergic rhinitis: Secondary | ICD-10-CM | POA: Diagnosis not present

## 2014-05-13 DIAGNOSIS — F0280 Dementia in other diseases classified elsewhere without behavioral disturbance: Secondary | ICD-10-CM | POA: Diagnosis not present

## 2014-05-13 DIAGNOSIS — Z681 Body mass index (BMI) 19 or less, adult: Secondary | ICD-10-CM | POA: Diagnosis not present

## 2014-05-13 DIAGNOSIS — G309 Alzheimer's disease, unspecified: Secondary | ICD-10-CM | POA: Diagnosis not present

## 2014-05-14 DIAGNOSIS — M1990 Unspecified osteoarthritis, unspecified site: Secondary | ICD-10-CM | POA: Diagnosis not present

## 2014-05-14 DIAGNOSIS — F0280 Dementia in other diseases classified elsewhere without behavioral disturbance: Secondary | ICD-10-CM | POA: Diagnosis not present

## 2014-05-14 DIAGNOSIS — R634 Abnormal weight loss: Secondary | ICD-10-CM | POA: Diagnosis not present

## 2014-05-14 DIAGNOSIS — Z681 Body mass index (BMI) 19 or less, adult: Secondary | ICD-10-CM | POA: Diagnosis not present

## 2014-05-14 DIAGNOSIS — G309 Alzheimer's disease, unspecified: Secondary | ICD-10-CM | POA: Diagnosis not present

## 2014-05-14 DIAGNOSIS — J3089 Other allergic rhinitis: Secondary | ICD-10-CM | POA: Diagnosis not present

## 2014-05-15 DIAGNOSIS — R634 Abnormal weight loss: Secondary | ICD-10-CM | POA: Diagnosis not present

## 2014-05-15 DIAGNOSIS — J3089 Other allergic rhinitis: Secondary | ICD-10-CM | POA: Diagnosis not present

## 2014-05-15 DIAGNOSIS — G309 Alzheimer's disease, unspecified: Secondary | ICD-10-CM | POA: Diagnosis not present

## 2014-05-15 DIAGNOSIS — M1990 Unspecified osteoarthritis, unspecified site: Secondary | ICD-10-CM | POA: Diagnosis not present

## 2014-05-15 DIAGNOSIS — Z681 Body mass index (BMI) 19 or less, adult: Secondary | ICD-10-CM | POA: Diagnosis not present

## 2014-05-15 DIAGNOSIS — F0280 Dementia in other diseases classified elsewhere without behavioral disturbance: Secondary | ICD-10-CM | POA: Diagnosis not present

## 2014-05-16 DIAGNOSIS — M1990 Unspecified osteoarthritis, unspecified site: Secondary | ICD-10-CM | POA: Diagnosis not present

## 2014-05-16 DIAGNOSIS — F0280 Dementia in other diseases classified elsewhere without behavioral disturbance: Secondary | ICD-10-CM | POA: Diagnosis not present

## 2014-05-16 DIAGNOSIS — R634 Abnormal weight loss: Secondary | ICD-10-CM | POA: Diagnosis not present

## 2014-05-16 DIAGNOSIS — J3089 Other allergic rhinitis: Secondary | ICD-10-CM | POA: Diagnosis not present

## 2014-05-16 DIAGNOSIS — G309 Alzheimer's disease, unspecified: Secondary | ICD-10-CM | POA: Diagnosis not present

## 2014-05-16 DIAGNOSIS — Z681 Body mass index (BMI) 19 or less, adult: Secondary | ICD-10-CM | POA: Diagnosis not present

## 2014-05-17 DIAGNOSIS — M1990 Unspecified osteoarthritis, unspecified site: Secondary | ICD-10-CM | POA: Diagnosis not present

## 2014-05-17 DIAGNOSIS — J3089 Other allergic rhinitis: Secondary | ICD-10-CM | POA: Diagnosis not present

## 2014-05-17 DIAGNOSIS — G309 Alzheimer's disease, unspecified: Secondary | ICD-10-CM | POA: Diagnosis not present

## 2014-05-17 DIAGNOSIS — R634 Abnormal weight loss: Secondary | ICD-10-CM | POA: Diagnosis not present

## 2014-05-17 DIAGNOSIS — F0280 Dementia in other diseases classified elsewhere without behavioral disturbance: Secondary | ICD-10-CM | POA: Diagnosis not present

## 2014-05-17 DIAGNOSIS — Z681 Body mass index (BMI) 19 or less, adult: Secondary | ICD-10-CM | POA: Diagnosis not present

## 2014-05-18 ENCOUNTER — Telehealth: Payer: Self-pay | Admitting: *Deleted

## 2014-05-18 DIAGNOSIS — Z681 Body mass index (BMI) 19 or less, adult: Secondary | ICD-10-CM | POA: Diagnosis not present

## 2014-05-18 DIAGNOSIS — F0280 Dementia in other diseases classified elsewhere without behavioral disturbance: Secondary | ICD-10-CM | POA: Diagnosis not present

## 2014-05-18 DIAGNOSIS — G309 Alzheimer's disease, unspecified: Secondary | ICD-10-CM | POA: Diagnosis not present

## 2014-05-18 DIAGNOSIS — M1990 Unspecified osteoarthritis, unspecified site: Secondary | ICD-10-CM | POA: Diagnosis not present

## 2014-05-18 DIAGNOSIS — J3089 Other allergic rhinitis: Secondary | ICD-10-CM | POA: Diagnosis not present

## 2014-05-18 DIAGNOSIS — R634 Abnormal weight loss: Secondary | ICD-10-CM | POA: Diagnosis not present

## 2014-05-18 NOTE — Telephone Encounter (Signed)
Left detailed message on son's VM, asked son to return my call if he has any questions

## 2014-05-18 NOTE — Telephone Encounter (Signed)
Patient's son Jeneen Rinks) left a voicemail stating that he is trying to move his mom from [facility] to [facility]. Jeneen Rinks stated that he needs the name and telephone number of the person at [facility] to start this process.

## 2014-05-18 NOTE — Telephone Encounter (Signed)
Let him know the admissions coordinator is Lawerance Sabal Her number is [phone].  We are pretty full right now--I am not sure there are any available beds. Let him know I am hoping to go to [facility] next Thursday morning (the 26th). I hope he can make it there for the visit

## 2014-05-19 DIAGNOSIS — G309 Alzheimer's disease, unspecified: Secondary | ICD-10-CM | POA: Diagnosis not present

## 2014-05-19 DIAGNOSIS — M1990 Unspecified osteoarthritis, unspecified site: Secondary | ICD-10-CM | POA: Diagnosis not present

## 2014-05-19 DIAGNOSIS — J3089 Other allergic rhinitis: Secondary | ICD-10-CM | POA: Diagnosis not present

## 2014-05-19 DIAGNOSIS — F0280 Dementia in other diseases classified elsewhere without behavioral disturbance: Secondary | ICD-10-CM | POA: Diagnosis not present

## 2014-05-19 DIAGNOSIS — R634 Abnormal weight loss: Secondary | ICD-10-CM | POA: Diagnosis not present

## 2014-05-19 DIAGNOSIS — Z681 Body mass index (BMI) 19 or less, adult: Secondary | ICD-10-CM | POA: Diagnosis not present

## 2014-05-20 DIAGNOSIS — J3089 Other allergic rhinitis: Secondary | ICD-10-CM | POA: Diagnosis not present

## 2014-05-20 DIAGNOSIS — F0280 Dementia in other diseases classified elsewhere without behavioral disturbance: Secondary | ICD-10-CM | POA: Diagnosis not present

## 2014-05-20 DIAGNOSIS — G309 Alzheimer's disease, unspecified: Secondary | ICD-10-CM | POA: Diagnosis not present

## 2014-05-20 DIAGNOSIS — R634 Abnormal weight loss: Secondary | ICD-10-CM | POA: Diagnosis not present

## 2014-05-20 DIAGNOSIS — M1990 Unspecified osteoarthritis, unspecified site: Secondary | ICD-10-CM | POA: Diagnosis not present

## 2014-05-20 DIAGNOSIS — Z681 Body mass index (BMI) 19 or less, adult: Secondary | ICD-10-CM | POA: Diagnosis not present

## 2014-05-21 DIAGNOSIS — J3089 Other allergic rhinitis: Secondary | ICD-10-CM | POA: Diagnosis not present

## 2014-05-21 DIAGNOSIS — Z681 Body mass index (BMI) 19 or less, adult: Secondary | ICD-10-CM | POA: Diagnosis not present

## 2014-05-21 DIAGNOSIS — F0280 Dementia in other diseases classified elsewhere without behavioral disturbance: Secondary | ICD-10-CM | POA: Diagnosis not present

## 2014-05-21 DIAGNOSIS — G309 Alzheimer's disease, unspecified: Secondary | ICD-10-CM | POA: Diagnosis not present

## 2014-05-21 DIAGNOSIS — M1990 Unspecified osteoarthritis, unspecified site: Secondary | ICD-10-CM | POA: Diagnosis not present

## 2014-05-21 DIAGNOSIS — R634 Abnormal weight loss: Secondary | ICD-10-CM | POA: Diagnosis not present

## 2014-05-22 DIAGNOSIS — R634 Abnormal weight loss: Secondary | ICD-10-CM | POA: Diagnosis not present

## 2014-05-22 DIAGNOSIS — M1990 Unspecified osteoarthritis, unspecified site: Secondary | ICD-10-CM | POA: Diagnosis not present

## 2014-05-22 DIAGNOSIS — Z681 Body mass index (BMI) 19 or less, adult: Secondary | ICD-10-CM | POA: Diagnosis not present

## 2014-05-22 DIAGNOSIS — G309 Alzheimer's disease, unspecified: Secondary | ICD-10-CM | POA: Diagnosis not present

## 2014-05-22 DIAGNOSIS — F0280 Dementia in other diseases classified elsewhere without behavioral disturbance: Secondary | ICD-10-CM | POA: Diagnosis not present

## 2014-05-22 DIAGNOSIS — J3089 Other allergic rhinitis: Secondary | ICD-10-CM | POA: Diagnosis not present

## 2014-05-23 DIAGNOSIS — Z681 Body mass index (BMI) 19 or less, adult: Secondary | ICD-10-CM | POA: Diagnosis not present

## 2014-05-23 DIAGNOSIS — J3089 Other allergic rhinitis: Secondary | ICD-10-CM | POA: Diagnosis not present

## 2014-05-23 DIAGNOSIS — G309 Alzheimer's disease, unspecified: Secondary | ICD-10-CM | POA: Diagnosis not present

## 2014-05-23 DIAGNOSIS — R634 Abnormal weight loss: Secondary | ICD-10-CM | POA: Diagnosis not present

## 2014-05-23 DIAGNOSIS — F0280 Dementia in other diseases classified elsewhere without behavioral disturbance: Secondary | ICD-10-CM | POA: Diagnosis not present

## 2014-05-23 DIAGNOSIS — M1990 Unspecified osteoarthritis, unspecified site: Secondary | ICD-10-CM | POA: Diagnosis not present

## 2014-05-24 DIAGNOSIS — Z681 Body mass index (BMI) 19 or less, adult: Secondary | ICD-10-CM | POA: Diagnosis not present

## 2014-05-24 DIAGNOSIS — G309 Alzheimer's disease, unspecified: Secondary | ICD-10-CM | POA: Diagnosis not present

## 2014-05-24 DIAGNOSIS — J3089 Other allergic rhinitis: Secondary | ICD-10-CM | POA: Diagnosis not present

## 2014-05-24 DIAGNOSIS — R634 Abnormal weight loss: Secondary | ICD-10-CM | POA: Diagnosis not present

## 2014-05-24 DIAGNOSIS — M1990 Unspecified osteoarthritis, unspecified site: Secondary | ICD-10-CM | POA: Diagnosis not present

## 2014-05-24 DIAGNOSIS — F0280 Dementia in other diseases classified elsewhere without behavioral disturbance: Secondary | ICD-10-CM | POA: Diagnosis not present

## 2014-05-25 DIAGNOSIS — Z681 Body mass index (BMI) 19 or less, adult: Secondary | ICD-10-CM | POA: Diagnosis not present

## 2014-05-25 DIAGNOSIS — F0280 Dementia in other diseases classified elsewhere without behavioral disturbance: Secondary | ICD-10-CM | POA: Diagnosis not present

## 2014-05-25 DIAGNOSIS — G309 Alzheimer's disease, unspecified: Secondary | ICD-10-CM | POA: Diagnosis not present

## 2014-05-25 DIAGNOSIS — J3089 Other allergic rhinitis: Secondary | ICD-10-CM | POA: Diagnosis not present

## 2014-05-25 DIAGNOSIS — M1990 Unspecified osteoarthritis, unspecified site: Secondary | ICD-10-CM | POA: Diagnosis not present

## 2014-05-25 DIAGNOSIS — R634 Abnormal weight loss: Secondary | ICD-10-CM | POA: Diagnosis not present

## 2014-05-26 DIAGNOSIS — G309 Alzheimer's disease, unspecified: Secondary | ICD-10-CM | POA: Diagnosis not present

## 2014-05-26 DIAGNOSIS — R634 Abnormal weight loss: Secondary | ICD-10-CM | POA: Diagnosis not present

## 2014-05-26 DIAGNOSIS — M1990 Unspecified osteoarthritis, unspecified site: Secondary | ICD-10-CM | POA: Diagnosis not present

## 2014-05-26 DIAGNOSIS — Z681 Body mass index (BMI) 19 or less, adult: Secondary | ICD-10-CM | POA: Diagnosis not present

## 2014-05-26 DIAGNOSIS — F0280 Dementia in other diseases classified elsewhere without behavioral disturbance: Secondary | ICD-10-CM | POA: Diagnosis not present

## 2014-05-26 DIAGNOSIS — J3089 Other allergic rhinitis: Secondary | ICD-10-CM | POA: Diagnosis not present

## 2014-05-27 DIAGNOSIS — R634 Abnormal weight loss: Secondary | ICD-10-CM | POA: Diagnosis not present

## 2014-05-27 DIAGNOSIS — J3089 Other allergic rhinitis: Secondary | ICD-10-CM | POA: Diagnosis not present

## 2014-05-27 DIAGNOSIS — Z681 Body mass index (BMI) 19 or less, adult: Secondary | ICD-10-CM | POA: Diagnosis not present

## 2014-05-27 DIAGNOSIS — G309 Alzheimer's disease, unspecified: Secondary | ICD-10-CM | POA: Diagnosis not present

## 2014-05-27 DIAGNOSIS — M1990 Unspecified osteoarthritis, unspecified site: Secondary | ICD-10-CM | POA: Diagnosis not present

## 2014-05-27 DIAGNOSIS — F0280 Dementia in other diseases classified elsewhere without behavioral disturbance: Secondary | ICD-10-CM | POA: Diagnosis not present

## 2014-05-28 ENCOUNTER — Encounter: Payer: Self-pay | Admitting: Internal Medicine

## 2014-05-28 ENCOUNTER — Non-Acute Institutional Stay: Payer: Medicare Other | Admitting: Internal Medicine

## 2014-05-28 VITALS — BP 124/68 | HR 74 | Resp 20

## 2014-05-28 DIAGNOSIS — R634 Abnormal weight loss: Secondary | ICD-10-CM | POA: Diagnosis not present

## 2014-05-28 DIAGNOSIS — M170 Bilateral primary osteoarthritis of knee: Secondary | ICD-10-CM | POA: Diagnosis not present

## 2014-05-28 DIAGNOSIS — M1990 Unspecified osteoarthritis, unspecified site: Secondary | ICD-10-CM | POA: Diagnosis not present

## 2014-05-28 DIAGNOSIS — G309 Alzheimer's disease, unspecified: Secondary | ICD-10-CM | POA: Diagnosis not present

## 2014-05-28 DIAGNOSIS — F0280 Dementia in other diseases classified elsewhere without behavioral disturbance: Secondary | ICD-10-CM

## 2014-05-28 DIAGNOSIS — E43 Unspecified severe protein-calorie malnutrition: Secondary | ICD-10-CM | POA: Diagnosis not present

## 2014-05-28 DIAGNOSIS — E119 Type 2 diabetes mellitus without complications: Secondary | ICD-10-CM

## 2014-05-28 DIAGNOSIS — J3089 Other allergic rhinitis: Secondary | ICD-10-CM | POA: Diagnosis not present

## 2014-05-28 DIAGNOSIS — Z681 Body mass index (BMI) 19 or less, adult: Secondary | ICD-10-CM | POA: Diagnosis not present

## 2014-05-28 NOTE — Assessment & Plan Note (Signed)
Sugars fine on low dose metformin No labs due to overall poor status

## 2014-05-28 NOTE — Assessment & Plan Note (Signed)
Severe but has been stable Now off hospice Son looking for SNF for her

## 2014-05-28 NOTE — Progress Notes (Signed)
Subjective:    Patient ID: Tanya Clayton, female    DOB: June 26, 1935, 79 y.o.   MRN: [id number]  HPI Visit for follow up of severe dementia and other medical conditions Reviewed status with unit coordinator Jeneen Montgomery is here  Was released from hospice due to stability Is having [facility] hosp[facility] out to evaluate her to see if she qualifies by their standards Has hospital bed now She has chronic pain which is caused by osteoarthritis. A hospital bed will alleviate pain (and in fact she has slept much better since getting the bed from hospice some time ago) by allowing her neck, knees and back to be positioned in ways not feasible in an ordinary bed. Pain episodes require frequent changes in body position which cannot be achieved in a normal bed She continues on tramadol and ibuprofen regularly Hasn't walked in some time  Eats fair Needs to be fed totally still No choking Unable to be weighed but still seems to be losing weight  No apparent chest pain or SOB No significant edema  Mood seems to fine Still smiles  No agitation Doesn't seem to have any hallucinations  Sugars 101-132  No hypoglycemia  Current Outpatient Prescriptions on File Prior to Visit  Medication Sig Dispense Refill  . Alcohol Swabs (B-D SINGLE USE SWABS REGULAR) PADS FINGERSTICK BLOOD SUGAR TEST ONCE DAILY. 100 each 11  . bisacodyl (DULCOLAX) 10 MG suppository Place 10 mg rectally every 3 (three) days as needed for constipation.    Marland Kitchen glucose blood test strip True Track test strips; pt test blood sugar twice daily and as directed.250.00 100 each 3  . Lancets Misc. (UNISTIK 3 COMFORT) MISC Fingerstick blood sugar test once daily 100 each 1  . LORazepam (ATIVAN) 0.5 MG tablet Take 1 tablet (0.5 mg total) by mouth 3 (three) times daily as needed. 90 tablet 0  . metFORMIN (GLUCOPHAGE) 500 MG tablet Take 500 mg by mouth daily with breakfast.    . MONOJECT INS SYR .5CC/30G 30G X 5/16" 0.5 ML MISC USE AS DIRECTED 100  each PRN  . Morphine Sulfate (ROXANOL PO) Take 0.25 mLs by mouth every hour as needed (dyspnea/ agitation).    . polyethylene glycol (MIRALAX / GLYCOLAX) packet Take 17 g by mouth daily.    . traMADol (ULTRAM) 50 MG tablet Take 0.5 tablets (25 mg total) by mouth at bedtime. And tid prn (within 1 hour of standing dose) 90 tablet 0  . triamcinolone lotion (KENALOG) 0.1 % Apply 1 application topically 3 (three) times daily as needed.     No current facility-administered medications on file prior to visit.    No Known Allergies  Past Medical History  Diagnosis Date  . Alzheimer's disease 2006  . Breast cancer     Dr Grayland Ormond  . Allergy   . Arthritis   . Diabetes mellitus   . Sacral decubitus ulcer, stage II 02/2013    Past Surgical History  Procedure Laterality Date  . Mastectomy  2007    Right--with implant  . Abdominal hysterectomy  1960's    Family History  Problem Relation Age of Onset  . Diabetes Mother     History   Social History  . Marital Status: Widowed    Spouse Name: N/A  . Number of Children: 2  . Years of Education: N/A   Occupational History  . Schoolteacher     retired   Social History Main Topics  . Smoking status: Never Smoker   . Smokeless  tobacco: Never Used  . Alcohol Use: No  . Drug Use: Not on file  . Sexual Activity: Not on file   Other Topics Concern  . Not on file   Social History Narrative   1 son, 1 step daughter   Has DNR--done 10/10/12         Review of Systems Sleeps okay at night. Much during the day Bowels have been okay    Objective:   Physical Exam  Constitutional:  Awake briefly, smiling--then sleeping Allows exam  Neck: Normal range of motion. No thyromegaly present.  Cardiovascular: Normal rate, regular rhythm and normal heart sounds.  Exam reveals no gallop.   No murmur heard. Pulmonary/Chest: Effort normal and breath sounds normal. No respiratory distress. She has no wheezes. She has no rales.  Abdominal:  Soft. There is no tenderness.  Musculoskeletal: She exhibits no edema.  Knees are stiff and she has pain with passive ROM  Lymphadenopathy:    She has no cervical adenopathy.  Neurological:  Increased tone in legs          Assessment & Plan:

## 2014-05-28 NOTE — Assessment & Plan Note (Signed)
Eats but still loses weight Staff feed her and encourage her

## 2014-05-28 NOTE — Assessment & Plan Note (Signed)
Needs hospital bed for ongoing pain Continues on the current meds

## 2014-05-29 ENCOUNTER — Telehealth: Payer: Self-pay

## 2014-05-29 DIAGNOSIS — G309 Alzheimer's disease, unspecified: Secondary | ICD-10-CM | POA: Diagnosis not present

## 2014-05-29 DIAGNOSIS — R634 Abnormal weight loss: Secondary | ICD-10-CM | POA: Diagnosis not present

## 2014-05-29 DIAGNOSIS — F0280 Dementia in other diseases classified elsewhere without behavioral disturbance: Secondary | ICD-10-CM | POA: Diagnosis not present

## 2014-05-29 DIAGNOSIS — Z681 Body mass index (BMI) 19 or less, adult: Secondary | ICD-10-CM | POA: Diagnosis not present

## 2014-05-29 DIAGNOSIS — J3089 Other allergic rhinitis: Secondary | ICD-10-CM | POA: Diagnosis not present

## 2014-05-29 DIAGNOSIS — M1990 Unspecified osteoarthritis, unspecified site: Secondary | ICD-10-CM | POA: Diagnosis not present

## 2014-06-05 DIAGNOSIS — Z7401 Bed confinement status: Secondary | ICD-10-CM | POA: Diagnosis not present

## 2014-06-05 DIAGNOSIS — Z66 Do not resuscitate: Secondary | ICD-10-CM | POA: Diagnosis not present

## 2014-06-05 DIAGNOSIS — Z515 Encounter for palliative care: Secondary | ICD-10-CM | POA: Diagnosis not present

## 2014-06-05 DIAGNOSIS — E119 Type 2 diabetes mellitus without complications: Secondary | ICD-10-CM | POA: Diagnosis not present

## 2014-06-05 DIAGNOSIS — G311 Senile degeneration of brain, not elsewhere classified: Secondary | ICD-10-CM | POA: Diagnosis not present

## 2014-06-05 DIAGNOSIS — M1990 Unspecified osteoarthritis, unspecified site: Secondary | ICD-10-CM | POA: Diagnosis not present

## 2014-06-05 DIAGNOSIS — R32 Unspecified urinary incontinence: Secondary | ICD-10-CM | POA: Diagnosis not present

## 2014-06-05 DIAGNOSIS — R159 Full incontinence of feces: Secondary | ICD-10-CM | POA: Diagnosis not present

## 2014-06-06 DIAGNOSIS — R159 Full incontinence of feces: Secondary | ICD-10-CM | POA: Diagnosis not present

## 2014-06-06 DIAGNOSIS — M1990 Unspecified osteoarthritis, unspecified site: Secondary | ICD-10-CM | POA: Diagnosis not present

## 2014-06-06 DIAGNOSIS — Z7401 Bed confinement status: Secondary | ICD-10-CM | POA: Diagnosis not present

## 2014-06-06 DIAGNOSIS — G311 Senile degeneration of brain, not elsewhere classified: Secondary | ICD-10-CM | POA: Diagnosis not present

## 2014-06-06 DIAGNOSIS — R32 Unspecified urinary incontinence: Secondary | ICD-10-CM | POA: Diagnosis not present

## 2014-06-06 DIAGNOSIS — E119 Type 2 diabetes mellitus without complications: Secondary | ICD-10-CM | POA: Diagnosis not present

## 2014-06-08 DIAGNOSIS — R159 Full incontinence of feces: Secondary | ICD-10-CM | POA: Diagnosis not present

## 2014-06-08 DIAGNOSIS — G311 Senile degeneration of brain, not elsewhere classified: Secondary | ICD-10-CM | POA: Diagnosis not present

## 2014-06-08 DIAGNOSIS — M1990 Unspecified osteoarthritis, unspecified site: Secondary | ICD-10-CM | POA: Diagnosis not present

## 2014-06-08 DIAGNOSIS — E119 Type 2 diabetes mellitus without complications: Secondary | ICD-10-CM | POA: Diagnosis not present

## 2014-06-08 DIAGNOSIS — R32 Unspecified urinary incontinence: Secondary | ICD-10-CM | POA: Diagnosis not present

## 2014-06-08 DIAGNOSIS — Z7401 Bed confinement status: Secondary | ICD-10-CM | POA: Diagnosis not present

## 2014-06-09 DIAGNOSIS — R159 Full incontinence of feces: Secondary | ICD-10-CM | POA: Diagnosis not present

## 2014-06-09 DIAGNOSIS — M1990 Unspecified osteoarthritis, unspecified site: Secondary | ICD-10-CM | POA: Diagnosis not present

## 2014-06-09 DIAGNOSIS — Z7401 Bed confinement status: Secondary | ICD-10-CM | POA: Diagnosis not present

## 2014-06-09 DIAGNOSIS — E119 Type 2 diabetes mellitus without complications: Secondary | ICD-10-CM | POA: Diagnosis not present

## 2014-06-09 DIAGNOSIS — G311 Senile degeneration of brain, not elsewhere classified: Secondary | ICD-10-CM | POA: Diagnosis not present

## 2014-06-09 DIAGNOSIS — R32 Unspecified urinary incontinence: Secondary | ICD-10-CM | POA: Diagnosis not present

## 2014-06-10 DIAGNOSIS — Z7401 Bed confinement status: Secondary | ICD-10-CM | POA: Diagnosis not present

## 2014-06-10 DIAGNOSIS — R159 Full incontinence of feces: Secondary | ICD-10-CM | POA: Diagnosis not present

## 2014-06-10 DIAGNOSIS — R32 Unspecified urinary incontinence: Secondary | ICD-10-CM | POA: Diagnosis not present

## 2014-06-10 DIAGNOSIS — M1990 Unspecified osteoarthritis, unspecified site: Secondary | ICD-10-CM | POA: Diagnosis not present

## 2014-06-10 DIAGNOSIS — G311 Senile degeneration of brain, not elsewhere classified: Secondary | ICD-10-CM | POA: Diagnosis not present

## 2014-06-10 DIAGNOSIS — E119 Type 2 diabetes mellitus without complications: Secondary | ICD-10-CM | POA: Diagnosis not present

## 2014-06-11 DIAGNOSIS — E119 Type 2 diabetes mellitus without complications: Secondary | ICD-10-CM | POA: Diagnosis not present

## 2014-06-11 DIAGNOSIS — R159 Full incontinence of feces: Secondary | ICD-10-CM | POA: Diagnosis not present

## 2014-06-11 DIAGNOSIS — G311 Senile degeneration of brain, not elsewhere classified: Secondary | ICD-10-CM | POA: Diagnosis not present

## 2014-06-11 DIAGNOSIS — R32 Unspecified urinary incontinence: Secondary | ICD-10-CM | POA: Diagnosis not present

## 2014-06-11 DIAGNOSIS — Z7401 Bed confinement status: Secondary | ICD-10-CM | POA: Diagnosis not present

## 2014-06-11 DIAGNOSIS — M1990 Unspecified osteoarthritis, unspecified site: Secondary | ICD-10-CM | POA: Diagnosis not present

## 2014-06-12 DIAGNOSIS — E119 Type 2 diabetes mellitus without complications: Secondary | ICD-10-CM | POA: Diagnosis not present

## 2014-06-12 DIAGNOSIS — R159 Full incontinence of feces: Secondary | ICD-10-CM | POA: Diagnosis not present

## 2014-06-12 DIAGNOSIS — G311 Senile degeneration of brain, not elsewhere classified: Secondary | ICD-10-CM | POA: Diagnosis not present

## 2014-06-12 DIAGNOSIS — R32 Unspecified urinary incontinence: Secondary | ICD-10-CM | POA: Diagnosis not present

## 2014-06-12 DIAGNOSIS — M1990 Unspecified osteoarthritis, unspecified site: Secondary | ICD-10-CM | POA: Diagnosis not present

## 2014-06-12 DIAGNOSIS — Z7401 Bed confinement status: Secondary | ICD-10-CM | POA: Diagnosis not present

## 2014-06-15 DIAGNOSIS — R32 Unspecified urinary incontinence: Secondary | ICD-10-CM | POA: Diagnosis not present

## 2014-06-15 DIAGNOSIS — Z7401 Bed confinement status: Secondary | ICD-10-CM | POA: Diagnosis not present

## 2014-06-15 DIAGNOSIS — G311 Senile degeneration of brain, not elsewhere classified: Secondary | ICD-10-CM | POA: Diagnosis not present

## 2014-06-15 DIAGNOSIS — E119 Type 2 diabetes mellitus without complications: Secondary | ICD-10-CM | POA: Diagnosis not present

## 2014-06-15 DIAGNOSIS — M1990 Unspecified osteoarthritis, unspecified site: Secondary | ICD-10-CM | POA: Diagnosis not present

## 2014-06-15 DIAGNOSIS — R159 Full incontinence of feces: Secondary | ICD-10-CM | POA: Diagnosis not present

## 2014-06-16 DIAGNOSIS — G311 Senile degeneration of brain, not elsewhere classified: Secondary | ICD-10-CM | POA: Diagnosis not present

## 2014-06-16 DIAGNOSIS — R32 Unspecified urinary incontinence: Secondary | ICD-10-CM | POA: Diagnosis not present

## 2014-06-16 DIAGNOSIS — M1990 Unspecified osteoarthritis, unspecified site: Secondary | ICD-10-CM | POA: Diagnosis not present

## 2014-06-16 DIAGNOSIS — R159 Full incontinence of feces: Secondary | ICD-10-CM | POA: Diagnosis not present

## 2014-06-16 DIAGNOSIS — E119 Type 2 diabetes mellitus without complications: Secondary | ICD-10-CM | POA: Diagnosis not present

## 2014-06-16 DIAGNOSIS — Z7401 Bed confinement status: Secondary | ICD-10-CM | POA: Diagnosis not present

## 2014-06-17 DIAGNOSIS — Z7401 Bed confinement status: Secondary | ICD-10-CM | POA: Diagnosis not present

## 2014-06-17 DIAGNOSIS — R159 Full incontinence of feces: Secondary | ICD-10-CM | POA: Diagnosis not present

## 2014-06-17 DIAGNOSIS — M1990 Unspecified osteoarthritis, unspecified site: Secondary | ICD-10-CM | POA: Diagnosis not present

## 2014-06-17 DIAGNOSIS — E119 Type 2 diabetes mellitus without complications: Secondary | ICD-10-CM | POA: Diagnosis not present

## 2014-06-17 DIAGNOSIS — R32 Unspecified urinary incontinence: Secondary | ICD-10-CM | POA: Diagnosis not present

## 2014-06-17 DIAGNOSIS — G311 Senile degeneration of brain, not elsewhere classified: Secondary | ICD-10-CM | POA: Diagnosis not present

## 2014-06-18 DIAGNOSIS — R159 Full incontinence of feces: Secondary | ICD-10-CM | POA: Diagnosis not present

## 2014-06-18 DIAGNOSIS — E119 Type 2 diabetes mellitus without complications: Secondary | ICD-10-CM | POA: Diagnosis not present

## 2014-06-18 DIAGNOSIS — R32 Unspecified urinary incontinence: Secondary | ICD-10-CM | POA: Diagnosis not present

## 2014-06-18 DIAGNOSIS — M1990 Unspecified osteoarthritis, unspecified site: Secondary | ICD-10-CM | POA: Diagnosis not present

## 2014-06-18 DIAGNOSIS — Z7401 Bed confinement status: Secondary | ICD-10-CM | POA: Diagnosis not present

## 2014-06-18 DIAGNOSIS — G311 Senile degeneration of brain, not elsewhere classified: Secondary | ICD-10-CM | POA: Diagnosis not present

## 2014-06-19 DIAGNOSIS — Z7401 Bed confinement status: Secondary | ICD-10-CM | POA: Diagnosis not present

## 2014-06-19 DIAGNOSIS — G311 Senile degeneration of brain, not elsewhere classified: Secondary | ICD-10-CM | POA: Diagnosis not present

## 2014-06-19 DIAGNOSIS — R32 Unspecified urinary incontinence: Secondary | ICD-10-CM | POA: Diagnosis not present

## 2014-06-19 DIAGNOSIS — M1990 Unspecified osteoarthritis, unspecified site: Secondary | ICD-10-CM | POA: Diagnosis not present

## 2014-06-19 DIAGNOSIS — R159 Full incontinence of feces: Secondary | ICD-10-CM | POA: Diagnosis not present

## 2014-06-19 DIAGNOSIS — E119 Type 2 diabetes mellitus without complications: Secondary | ICD-10-CM | POA: Diagnosis not present

## 2014-06-22 DIAGNOSIS — Z7401 Bed confinement status: Secondary | ICD-10-CM | POA: Diagnosis not present

## 2014-06-22 DIAGNOSIS — M1990 Unspecified osteoarthritis, unspecified site: Secondary | ICD-10-CM | POA: Diagnosis not present

## 2014-06-22 DIAGNOSIS — E119 Type 2 diabetes mellitus without complications: Secondary | ICD-10-CM | POA: Diagnosis not present

## 2014-06-22 DIAGNOSIS — G311 Senile degeneration of brain, not elsewhere classified: Secondary | ICD-10-CM | POA: Diagnosis not present

## 2014-06-22 DIAGNOSIS — R159 Full incontinence of feces: Secondary | ICD-10-CM | POA: Diagnosis not present

## 2014-06-22 DIAGNOSIS — R32 Unspecified urinary incontinence: Secondary | ICD-10-CM | POA: Diagnosis not present

## 2014-06-23 DIAGNOSIS — E119 Type 2 diabetes mellitus without complications: Secondary | ICD-10-CM | POA: Diagnosis not present

## 2014-06-23 DIAGNOSIS — G311 Senile degeneration of brain, not elsewhere classified: Secondary | ICD-10-CM | POA: Diagnosis not present

## 2014-06-23 DIAGNOSIS — R159 Full incontinence of feces: Secondary | ICD-10-CM | POA: Diagnosis not present

## 2014-06-23 DIAGNOSIS — R32 Unspecified urinary incontinence: Secondary | ICD-10-CM | POA: Diagnosis not present

## 2014-06-23 DIAGNOSIS — M1990 Unspecified osteoarthritis, unspecified site: Secondary | ICD-10-CM | POA: Diagnosis not present

## 2014-06-23 DIAGNOSIS — Z7401 Bed confinement status: Secondary | ICD-10-CM | POA: Diagnosis not present

## 2014-06-24 DIAGNOSIS — R159 Full incontinence of feces: Secondary | ICD-10-CM | POA: Diagnosis not present

## 2014-06-24 DIAGNOSIS — E119 Type 2 diabetes mellitus without complications: Secondary | ICD-10-CM | POA: Diagnosis not present

## 2014-06-24 DIAGNOSIS — M1990 Unspecified osteoarthritis, unspecified site: Secondary | ICD-10-CM | POA: Diagnosis not present

## 2014-06-24 DIAGNOSIS — G311 Senile degeneration of brain, not elsewhere classified: Secondary | ICD-10-CM | POA: Diagnosis not present

## 2014-06-24 DIAGNOSIS — Z7401 Bed confinement status: Secondary | ICD-10-CM | POA: Diagnosis not present

## 2014-06-24 DIAGNOSIS — R32 Unspecified urinary incontinence: Secondary | ICD-10-CM | POA: Diagnosis not present

## 2014-06-25 DIAGNOSIS — Z7401 Bed confinement status: Secondary | ICD-10-CM | POA: Diagnosis not present

## 2014-06-25 DIAGNOSIS — G311 Senile degeneration of brain, not elsewhere classified: Secondary | ICD-10-CM | POA: Diagnosis not present

## 2014-06-25 DIAGNOSIS — R32 Unspecified urinary incontinence: Secondary | ICD-10-CM | POA: Diagnosis not present

## 2014-06-25 DIAGNOSIS — E119 Type 2 diabetes mellitus without complications: Secondary | ICD-10-CM | POA: Diagnosis not present

## 2014-06-25 DIAGNOSIS — M1990 Unspecified osteoarthritis, unspecified site: Secondary | ICD-10-CM | POA: Diagnosis not present

## 2014-06-25 DIAGNOSIS — R159 Full incontinence of feces: Secondary | ICD-10-CM | POA: Diagnosis not present

## 2014-06-26 DIAGNOSIS — E119 Type 2 diabetes mellitus without complications: Secondary | ICD-10-CM | POA: Diagnosis not present

## 2014-06-26 DIAGNOSIS — R32 Unspecified urinary incontinence: Secondary | ICD-10-CM | POA: Diagnosis not present

## 2014-06-26 DIAGNOSIS — G311 Senile degeneration of brain, not elsewhere classified: Secondary | ICD-10-CM | POA: Diagnosis not present

## 2014-06-26 DIAGNOSIS — Z7401 Bed confinement status: Secondary | ICD-10-CM | POA: Diagnosis not present

## 2014-06-26 DIAGNOSIS — R159 Full incontinence of feces: Secondary | ICD-10-CM | POA: Diagnosis not present

## 2014-06-26 DIAGNOSIS — M1990 Unspecified osteoarthritis, unspecified site: Secondary | ICD-10-CM | POA: Diagnosis not present

## 2014-06-29 DIAGNOSIS — G311 Senile degeneration of brain, not elsewhere classified: Secondary | ICD-10-CM | POA: Diagnosis not present

## 2014-06-29 DIAGNOSIS — R159 Full incontinence of feces: Secondary | ICD-10-CM | POA: Diagnosis not present

## 2014-06-29 DIAGNOSIS — M1990 Unspecified osteoarthritis, unspecified site: Secondary | ICD-10-CM | POA: Diagnosis not present

## 2014-06-29 DIAGNOSIS — E119 Type 2 diabetes mellitus without complications: Secondary | ICD-10-CM | POA: Diagnosis not present

## 2014-06-29 DIAGNOSIS — Z7401 Bed confinement status: Secondary | ICD-10-CM | POA: Diagnosis not present

## 2014-06-29 DIAGNOSIS — R32 Unspecified urinary incontinence: Secondary | ICD-10-CM | POA: Diagnosis not present

## 2014-06-30 DIAGNOSIS — G311 Senile degeneration of brain, not elsewhere classified: Secondary | ICD-10-CM | POA: Diagnosis not present

## 2014-06-30 DIAGNOSIS — R159 Full incontinence of feces: Secondary | ICD-10-CM | POA: Diagnosis not present

## 2014-06-30 DIAGNOSIS — M1990 Unspecified osteoarthritis, unspecified site: Secondary | ICD-10-CM | POA: Diagnosis not present

## 2014-06-30 DIAGNOSIS — R32 Unspecified urinary incontinence: Secondary | ICD-10-CM | POA: Diagnosis not present

## 2014-06-30 DIAGNOSIS — Z7401 Bed confinement status: Secondary | ICD-10-CM | POA: Diagnosis not present

## 2014-06-30 DIAGNOSIS — E119 Type 2 diabetes mellitus without complications: Secondary | ICD-10-CM | POA: Diagnosis not present

## 2014-07-01 DIAGNOSIS — R32 Unspecified urinary incontinence: Secondary | ICD-10-CM | POA: Diagnosis not present

## 2014-07-01 DIAGNOSIS — R159 Full incontinence of feces: Secondary | ICD-10-CM | POA: Diagnosis not present

## 2014-07-01 DIAGNOSIS — G311 Senile degeneration of brain, not elsewhere classified: Secondary | ICD-10-CM | POA: Diagnosis not present

## 2014-07-01 DIAGNOSIS — E119 Type 2 diabetes mellitus without complications: Secondary | ICD-10-CM | POA: Diagnosis not present

## 2014-07-01 DIAGNOSIS — M1990 Unspecified osteoarthritis, unspecified site: Secondary | ICD-10-CM | POA: Diagnosis not present

## 2014-07-01 DIAGNOSIS — Z7401 Bed confinement status: Secondary | ICD-10-CM | POA: Diagnosis not present

## 2014-07-02 DIAGNOSIS — R159 Full incontinence of feces: Secondary | ICD-10-CM | POA: Diagnosis not present

## 2014-07-02 DIAGNOSIS — R32 Unspecified urinary incontinence: Secondary | ICD-10-CM | POA: Diagnosis not present

## 2014-07-02 DIAGNOSIS — M1990 Unspecified osteoarthritis, unspecified site: Secondary | ICD-10-CM | POA: Diagnosis not present

## 2014-07-02 DIAGNOSIS — E119 Type 2 diabetes mellitus without complications: Secondary | ICD-10-CM | POA: Diagnosis not present

## 2014-07-02 DIAGNOSIS — G311 Senile degeneration of brain, not elsewhere classified: Secondary | ICD-10-CM | POA: Diagnosis not present

## 2014-07-02 DIAGNOSIS — Z7401 Bed confinement status: Secondary | ICD-10-CM | POA: Diagnosis not present

## 2014-07-03 DIAGNOSIS — Z515 Encounter for palliative care: Secondary | ICD-10-CM | POA: Diagnosis not present

## 2014-07-03 DIAGNOSIS — R159 Full incontinence of feces: Secondary | ICD-10-CM | POA: Diagnosis not present

## 2014-07-03 DIAGNOSIS — Z66 Do not resuscitate: Secondary | ICD-10-CM | POA: Diagnosis not present

## 2014-07-03 DIAGNOSIS — R32 Unspecified urinary incontinence: Secondary | ICD-10-CM | POA: Diagnosis not present

## 2014-07-03 DIAGNOSIS — E119 Type 2 diabetes mellitus without complications: Secondary | ICD-10-CM | POA: Diagnosis not present

## 2014-07-03 DIAGNOSIS — Z7401 Bed confinement status: Secondary | ICD-10-CM | POA: Diagnosis not present

## 2014-07-03 DIAGNOSIS — M1990 Unspecified osteoarthritis, unspecified site: Secondary | ICD-10-CM | POA: Diagnosis not present

## 2014-07-03 DIAGNOSIS — G311 Senile degeneration of brain, not elsewhere classified: Secondary | ICD-10-CM | POA: Diagnosis not present

## 2014-07-07 DIAGNOSIS — M1990 Unspecified osteoarthritis, unspecified site: Secondary | ICD-10-CM | POA: Diagnosis not present

## 2014-07-07 DIAGNOSIS — G311 Senile degeneration of brain, not elsewhere classified: Secondary | ICD-10-CM | POA: Diagnosis not present

## 2014-07-07 DIAGNOSIS — R32 Unspecified urinary incontinence: Secondary | ICD-10-CM | POA: Diagnosis not present

## 2014-07-07 DIAGNOSIS — Z7401 Bed confinement status: Secondary | ICD-10-CM | POA: Diagnosis not present

## 2014-07-07 DIAGNOSIS — R159 Full incontinence of feces: Secondary | ICD-10-CM | POA: Diagnosis not present

## 2014-07-07 DIAGNOSIS — E119 Type 2 diabetes mellitus without complications: Secondary | ICD-10-CM | POA: Diagnosis not present

## 2014-07-08 DIAGNOSIS — G311 Senile degeneration of brain, not elsewhere classified: Secondary | ICD-10-CM | POA: Diagnosis not present

## 2014-07-08 DIAGNOSIS — Z7401 Bed confinement status: Secondary | ICD-10-CM | POA: Diagnosis not present

## 2014-07-08 DIAGNOSIS — R159 Full incontinence of feces: Secondary | ICD-10-CM | POA: Diagnosis not present

## 2014-07-08 DIAGNOSIS — R32 Unspecified urinary incontinence: Secondary | ICD-10-CM | POA: Diagnosis not present

## 2014-07-08 DIAGNOSIS — E119 Type 2 diabetes mellitus without complications: Secondary | ICD-10-CM | POA: Diagnosis not present

## 2014-07-08 DIAGNOSIS — M1990 Unspecified osteoarthritis, unspecified site: Secondary | ICD-10-CM | POA: Diagnosis not present

## 2014-07-09 DIAGNOSIS — G311 Senile degeneration of brain, not elsewhere classified: Secondary | ICD-10-CM | POA: Diagnosis not present

## 2014-07-09 DIAGNOSIS — R159 Full incontinence of feces: Secondary | ICD-10-CM | POA: Diagnosis not present

## 2014-07-09 DIAGNOSIS — Z7401 Bed confinement status: Secondary | ICD-10-CM | POA: Diagnosis not present

## 2014-07-09 DIAGNOSIS — R32 Unspecified urinary incontinence: Secondary | ICD-10-CM | POA: Diagnosis not present

## 2014-07-09 DIAGNOSIS — E119 Type 2 diabetes mellitus without complications: Secondary | ICD-10-CM | POA: Diagnosis not present

## 2014-07-09 DIAGNOSIS — M1990 Unspecified osteoarthritis, unspecified site: Secondary | ICD-10-CM | POA: Diagnosis not present

## 2014-07-10 DIAGNOSIS — R159 Full incontinence of feces: Secondary | ICD-10-CM | POA: Diagnosis not present

## 2014-07-10 DIAGNOSIS — R32 Unspecified urinary incontinence: Secondary | ICD-10-CM | POA: Diagnosis not present

## 2014-07-10 DIAGNOSIS — G311 Senile degeneration of brain, not elsewhere classified: Secondary | ICD-10-CM | POA: Diagnosis not present

## 2014-07-10 DIAGNOSIS — E119 Type 2 diabetes mellitus without complications: Secondary | ICD-10-CM | POA: Diagnosis not present

## 2014-07-10 DIAGNOSIS — Z7401 Bed confinement status: Secondary | ICD-10-CM | POA: Diagnosis not present

## 2014-07-10 DIAGNOSIS — M1990 Unspecified osteoarthritis, unspecified site: Secondary | ICD-10-CM | POA: Diagnosis not present

## 2014-07-13 DIAGNOSIS — R159 Full incontinence of feces: Secondary | ICD-10-CM | POA: Diagnosis not present

## 2014-07-13 DIAGNOSIS — G311 Senile degeneration of brain, not elsewhere classified: Secondary | ICD-10-CM | POA: Diagnosis not present

## 2014-07-13 DIAGNOSIS — R32 Unspecified urinary incontinence: Secondary | ICD-10-CM | POA: Diagnosis not present

## 2014-07-13 DIAGNOSIS — E119 Type 2 diabetes mellitus without complications: Secondary | ICD-10-CM | POA: Diagnosis not present

## 2014-07-13 DIAGNOSIS — Z7401 Bed confinement status: Secondary | ICD-10-CM | POA: Diagnosis not present

## 2014-07-13 DIAGNOSIS — M1990 Unspecified osteoarthritis, unspecified site: Secondary | ICD-10-CM | POA: Diagnosis not present

## 2014-07-14 DIAGNOSIS — E119 Type 2 diabetes mellitus without complications: Secondary | ICD-10-CM | POA: Diagnosis not present

## 2014-07-14 DIAGNOSIS — M1990 Unspecified osteoarthritis, unspecified site: Secondary | ICD-10-CM | POA: Diagnosis not present

## 2014-07-14 DIAGNOSIS — Z7401 Bed confinement status: Secondary | ICD-10-CM | POA: Diagnosis not present

## 2014-07-14 DIAGNOSIS — R159 Full incontinence of feces: Secondary | ICD-10-CM | POA: Diagnosis not present

## 2014-07-14 DIAGNOSIS — R32 Unspecified urinary incontinence: Secondary | ICD-10-CM | POA: Diagnosis not present

## 2014-07-14 DIAGNOSIS — G311 Senile degeneration of brain, not elsewhere classified: Secondary | ICD-10-CM | POA: Diagnosis not present

## 2014-07-15 DIAGNOSIS — E119 Type 2 diabetes mellitus without complications: Secondary | ICD-10-CM | POA: Diagnosis not present

## 2014-07-15 DIAGNOSIS — G311 Senile degeneration of brain, not elsewhere classified: Secondary | ICD-10-CM | POA: Diagnosis not present

## 2014-07-15 DIAGNOSIS — M1990 Unspecified osteoarthritis, unspecified site: Secondary | ICD-10-CM | POA: Diagnosis not present

## 2014-07-15 DIAGNOSIS — R32 Unspecified urinary incontinence: Secondary | ICD-10-CM | POA: Diagnosis not present

## 2014-07-15 DIAGNOSIS — Z7401 Bed confinement status: Secondary | ICD-10-CM | POA: Diagnosis not present

## 2014-07-15 DIAGNOSIS — R159 Full incontinence of feces: Secondary | ICD-10-CM | POA: Diagnosis not present

## 2014-07-16 DIAGNOSIS — R159 Full incontinence of feces: Secondary | ICD-10-CM | POA: Diagnosis not present

## 2014-07-16 DIAGNOSIS — R32 Unspecified urinary incontinence: Secondary | ICD-10-CM | POA: Diagnosis not present

## 2014-07-16 DIAGNOSIS — G311 Senile degeneration of brain, not elsewhere classified: Secondary | ICD-10-CM | POA: Diagnosis not present

## 2014-07-16 DIAGNOSIS — Z7401 Bed confinement status: Secondary | ICD-10-CM | POA: Diagnosis not present

## 2014-07-16 DIAGNOSIS — E119 Type 2 diabetes mellitus without complications: Secondary | ICD-10-CM | POA: Diagnosis not present

## 2014-07-16 DIAGNOSIS — M1990 Unspecified osteoarthritis, unspecified site: Secondary | ICD-10-CM | POA: Diagnosis not present

## 2014-07-17 DIAGNOSIS — G311 Senile degeneration of brain, not elsewhere classified: Secondary | ICD-10-CM | POA: Diagnosis not present

## 2014-07-17 DIAGNOSIS — E119 Type 2 diabetes mellitus without complications: Secondary | ICD-10-CM | POA: Diagnosis not present

## 2014-07-17 DIAGNOSIS — R159 Full incontinence of feces: Secondary | ICD-10-CM | POA: Diagnosis not present

## 2014-07-17 DIAGNOSIS — R32 Unspecified urinary incontinence: Secondary | ICD-10-CM | POA: Diagnosis not present

## 2014-07-17 DIAGNOSIS — M1990 Unspecified osteoarthritis, unspecified site: Secondary | ICD-10-CM | POA: Diagnosis not present

## 2014-07-17 DIAGNOSIS — Z7401 Bed confinement status: Secondary | ICD-10-CM | POA: Diagnosis not present

## 2014-07-20 DIAGNOSIS — Z7401 Bed confinement status: Secondary | ICD-10-CM | POA: Diagnosis not present

## 2014-07-20 DIAGNOSIS — R159 Full incontinence of feces: Secondary | ICD-10-CM | POA: Diagnosis not present

## 2014-07-20 DIAGNOSIS — E119 Type 2 diabetes mellitus without complications: Secondary | ICD-10-CM | POA: Diagnosis not present

## 2014-07-20 DIAGNOSIS — M1990 Unspecified osteoarthritis, unspecified site: Secondary | ICD-10-CM | POA: Diagnosis not present

## 2014-07-20 DIAGNOSIS — R32 Unspecified urinary incontinence: Secondary | ICD-10-CM | POA: Diagnosis not present

## 2014-07-20 DIAGNOSIS — G311 Senile degeneration of brain, not elsewhere classified: Secondary | ICD-10-CM | POA: Diagnosis not present

## 2014-07-21 DIAGNOSIS — G311 Senile degeneration of brain, not elsewhere classified: Secondary | ICD-10-CM | POA: Diagnosis not present

## 2014-07-21 DIAGNOSIS — R159 Full incontinence of feces: Secondary | ICD-10-CM | POA: Diagnosis not present

## 2014-07-21 DIAGNOSIS — M1990 Unspecified osteoarthritis, unspecified site: Secondary | ICD-10-CM | POA: Diagnosis not present

## 2014-07-21 DIAGNOSIS — R32 Unspecified urinary incontinence: Secondary | ICD-10-CM | POA: Diagnosis not present

## 2014-07-21 DIAGNOSIS — Z7401 Bed confinement status: Secondary | ICD-10-CM | POA: Diagnosis not present

## 2014-07-21 DIAGNOSIS — E119 Type 2 diabetes mellitus without complications: Secondary | ICD-10-CM | POA: Diagnosis not present

## 2014-07-22 DIAGNOSIS — G311 Senile degeneration of brain, not elsewhere classified: Secondary | ICD-10-CM | POA: Diagnosis not present

## 2014-07-22 DIAGNOSIS — R159 Full incontinence of feces: Secondary | ICD-10-CM | POA: Diagnosis not present

## 2014-07-22 DIAGNOSIS — M1990 Unspecified osteoarthritis, unspecified site: Secondary | ICD-10-CM | POA: Diagnosis not present

## 2014-07-22 DIAGNOSIS — E119 Type 2 diabetes mellitus without complications: Secondary | ICD-10-CM | POA: Diagnosis not present

## 2014-07-22 DIAGNOSIS — R32 Unspecified urinary incontinence: Secondary | ICD-10-CM | POA: Diagnosis not present

## 2014-07-22 DIAGNOSIS — Z7401 Bed confinement status: Secondary | ICD-10-CM | POA: Diagnosis not present

## 2014-07-23 ENCOUNTER — Telehealth: Payer: Self-pay | Admitting: *Deleted

## 2014-07-23 DIAGNOSIS — Z7401 Bed confinement status: Secondary | ICD-10-CM | POA: Diagnosis not present

## 2014-07-23 DIAGNOSIS — R159 Full incontinence of feces: Secondary | ICD-10-CM | POA: Diagnosis not present

## 2014-07-23 DIAGNOSIS — M1990 Unspecified osteoarthritis, unspecified site: Secondary | ICD-10-CM | POA: Diagnosis not present

## 2014-07-23 DIAGNOSIS — R32 Unspecified urinary incontinence: Secondary | ICD-10-CM | POA: Diagnosis not present

## 2014-07-23 DIAGNOSIS — G311 Senile degeneration of brain, not elsewhere classified: Secondary | ICD-10-CM | POA: Diagnosis not present

## 2014-07-23 DIAGNOSIS — E119 Type 2 diabetes mellitus without complications: Secondary | ICD-10-CM | POA: Diagnosis not present

## 2014-07-23 NOTE — Telephone Encounter (Signed)
Good to hear

## 2014-07-23 NOTE — Telephone Encounter (Signed)
[employer] left voicemail at Triage saying that Tanya Clayton's hospice override for Tramadol and Ibuprofen has been approved for 1 year and an approval letter will be sent to [place] and the Tanya Clayton

## 2014-07-24 ENCOUNTER — Telehealth: Payer: Self-pay | Admitting: Internal Medicine

## 2014-07-24 DIAGNOSIS — E119 Type 2 diabetes mellitus without complications: Secondary | ICD-10-CM | POA: Diagnosis not present

## 2014-07-24 DIAGNOSIS — R159 Full incontinence of feces: Secondary | ICD-10-CM | POA: Diagnosis not present

## 2014-07-24 DIAGNOSIS — M1990 Unspecified osteoarthritis, unspecified site: Secondary | ICD-10-CM | POA: Diagnosis not present

## 2014-07-24 DIAGNOSIS — G311 Senile degeneration of brain, not elsewhere classified: Secondary | ICD-10-CM | POA: Diagnosis not present

## 2014-07-24 DIAGNOSIS — Z7401 Bed confinement status: Secondary | ICD-10-CM | POA: Diagnosis not present

## 2014-07-24 DIAGNOSIS — R32 Unspecified urinary incontinence: Secondary | ICD-10-CM | POA: Diagnosis not present

## 2014-07-24 NOTE — Telephone Encounter (Signed)
Tanya Clayton called to let you know They discharged pt  from hospice end of may  [facility] admitted her to there care and family is not happy with the care [name] is getting Tanya Clayton would like an order to re access to meet criteria with hospice  Please do not alert [facility] until [name] has been admitted back into hospice If dr Silvio Pate agrees to this please send order and last office  [phone]

## 2014-07-24 NOTE — Telephone Encounter (Signed)
I don't think [facility] can readmit her unless there has been a significant change--- like weight loss of at least 5% of body weight They may just want to discharge [facility] and wait till there is a change---then get [facility] back

## 2014-07-27 DIAGNOSIS — M1990 Unspecified osteoarthritis, unspecified site: Secondary | ICD-10-CM | POA: Diagnosis not present

## 2014-07-27 DIAGNOSIS — R32 Unspecified urinary incontinence: Secondary | ICD-10-CM | POA: Diagnosis not present

## 2014-07-27 DIAGNOSIS — E119 Type 2 diabetes mellitus without complications: Secondary | ICD-10-CM | POA: Diagnosis not present

## 2014-07-27 DIAGNOSIS — Z7401 Bed confinement status: Secondary | ICD-10-CM | POA: Diagnosis not present

## 2014-07-27 DIAGNOSIS — G311 Senile degeneration of brain, not elsewhere classified: Secondary | ICD-10-CM | POA: Diagnosis not present

## 2014-07-27 DIAGNOSIS — R159 Full incontinence of feces: Secondary | ICD-10-CM | POA: Diagnosis not present

## 2014-07-27 NOTE — Telephone Encounter (Signed)
Spoke with Hilda Blades, they just wanted to get a order to go out to see if there has been any changes. I advised that Dr. Silvio Pate is out this week and Hilda Blades stated she would let the son know and they will keep everything the same until Dr.Letvak returns.

## 2014-07-28 DIAGNOSIS — G311 Senile degeneration of brain, not elsewhere classified: Secondary | ICD-10-CM | POA: Diagnosis not present

## 2014-07-28 DIAGNOSIS — M1990 Unspecified osteoarthritis, unspecified site: Secondary | ICD-10-CM | POA: Diagnosis not present

## 2014-07-28 DIAGNOSIS — R159 Full incontinence of feces: Secondary | ICD-10-CM | POA: Diagnosis not present

## 2014-07-28 DIAGNOSIS — E119 Type 2 diabetes mellitus without complications: Secondary | ICD-10-CM | POA: Diagnosis not present

## 2014-07-28 DIAGNOSIS — R32 Unspecified urinary incontinence: Secondary | ICD-10-CM | POA: Diagnosis not present

## 2014-07-28 DIAGNOSIS — Z7401 Bed confinement status: Secondary | ICD-10-CM | POA: Diagnosis not present

## 2014-07-28 NOTE — Telephone Encounter (Signed)
Called and left VM on triage to go out and reevaluate pt

## 2014-07-28 NOTE — Telephone Encounter (Signed)
Debra at [facility] called back to get verbal order for pt to be reevaluated for hospice by a nurse. Gave verbal order to [place] at [facility] as instructed by Dr Silvio Pate.

## 2014-07-28 NOTE — Telephone Encounter (Signed)
I am okay with Hospice going out to reevaluate her You can call Hospice [phone]) and ask them to reevaluate to see if they consider her hospice eligible again

## 2014-07-29 ENCOUNTER — Telehealth: Payer: Self-pay | Admitting: Internal Medicine

## 2014-07-29 DIAGNOSIS — Z7401 Bed confinement status: Secondary | ICD-10-CM | POA: Diagnosis not present

## 2014-07-29 DIAGNOSIS — M1990 Unspecified osteoarthritis, unspecified site: Secondary | ICD-10-CM | POA: Diagnosis not present

## 2014-07-29 DIAGNOSIS — G311 Senile degeneration of brain, not elsewhere classified: Secondary | ICD-10-CM | POA: Diagnosis not present

## 2014-07-29 DIAGNOSIS — E119 Type 2 diabetes mellitus without complications: Secondary | ICD-10-CM | POA: Diagnosis not present

## 2014-07-29 DIAGNOSIS — R32 Unspecified urinary incontinence: Secondary | ICD-10-CM | POA: Diagnosis not present

## 2014-07-29 DIAGNOSIS — R159 Full incontinence of feces: Secondary | ICD-10-CM | POA: Diagnosis not present

## 2014-07-29 NOTE — Telephone Encounter (Signed)
Tanya Clayton called she received order for hospice She needs last office note  [phone]  faxed

## 2014-07-29 NOTE — Telephone Encounter (Signed)
Faxed 5/26 office to debra

## 2014-07-30 DIAGNOSIS — G309 Alzheimer's disease, unspecified: Secondary | ICD-10-CM | POA: Diagnosis not present

## 2014-07-30 DIAGNOSIS — M1990 Unspecified osteoarthritis, unspecified site: Secondary | ICD-10-CM | POA: Diagnosis not present

## 2014-07-30 DIAGNOSIS — F0280 Dementia in other diseases classified elsewhere without behavioral disturbance: Secondary | ICD-10-CM | POA: Diagnosis not present

## 2014-07-30 DIAGNOSIS — E119 Type 2 diabetes mellitus without complications: Secondary | ICD-10-CM | POA: Diagnosis not present

## 2014-07-31 DIAGNOSIS — G309 Alzheimer's disease, unspecified: Secondary | ICD-10-CM | POA: Diagnosis not present

## 2014-07-31 DIAGNOSIS — F0280 Dementia in other diseases classified elsewhere without behavioral disturbance: Secondary | ICD-10-CM | POA: Diagnosis not present

## 2014-07-31 DIAGNOSIS — M1990 Unspecified osteoarthritis, unspecified site: Secondary | ICD-10-CM | POA: Diagnosis not present

## 2014-07-31 DIAGNOSIS — E119 Type 2 diabetes mellitus without complications: Secondary | ICD-10-CM | POA: Diagnosis not present

## 2014-08-01 DIAGNOSIS — M1990 Unspecified osteoarthritis, unspecified site: Secondary | ICD-10-CM | POA: Diagnosis not present

## 2014-08-01 DIAGNOSIS — G309 Alzheimer's disease, unspecified: Secondary | ICD-10-CM | POA: Diagnosis not present

## 2014-08-01 DIAGNOSIS — E119 Type 2 diabetes mellitus without complications: Secondary | ICD-10-CM | POA: Diagnosis not present

## 2014-08-01 DIAGNOSIS — F0280 Dementia in other diseases classified elsewhere without behavioral disturbance: Secondary | ICD-10-CM | POA: Diagnosis not present

## 2014-08-03 DIAGNOSIS — F0280 Dementia in other diseases classified elsewhere without behavioral disturbance: Secondary | ICD-10-CM | POA: Diagnosis not present

## 2014-08-03 DIAGNOSIS — E119 Type 2 diabetes mellitus without complications: Secondary | ICD-10-CM | POA: Diagnosis not present

## 2014-08-03 DIAGNOSIS — G309 Alzheimer's disease, unspecified: Secondary | ICD-10-CM | POA: Diagnosis not present

## 2014-08-03 DIAGNOSIS — M1990 Unspecified osteoarthritis, unspecified site: Secondary | ICD-10-CM | POA: Diagnosis not present

## 2014-08-04 DIAGNOSIS — G309 Alzheimer's disease, unspecified: Secondary | ICD-10-CM | POA: Diagnosis not present

## 2014-08-04 DIAGNOSIS — E119 Type 2 diabetes mellitus without complications: Secondary | ICD-10-CM | POA: Diagnosis not present

## 2014-08-04 DIAGNOSIS — M1990 Unspecified osteoarthritis, unspecified site: Secondary | ICD-10-CM | POA: Diagnosis not present

## 2014-08-04 DIAGNOSIS — F0280 Dementia in other diseases classified elsewhere without behavioral disturbance: Secondary | ICD-10-CM | POA: Diagnosis not present

## 2014-08-05 DIAGNOSIS — F0280 Dementia in other diseases classified elsewhere without behavioral disturbance: Secondary | ICD-10-CM | POA: Diagnosis not present

## 2014-08-05 DIAGNOSIS — M1990 Unspecified osteoarthritis, unspecified site: Secondary | ICD-10-CM | POA: Diagnosis not present

## 2014-08-05 DIAGNOSIS — G309 Alzheimer's disease, unspecified: Secondary | ICD-10-CM | POA: Diagnosis not present

## 2014-08-05 DIAGNOSIS — E119 Type 2 diabetes mellitus without complications: Secondary | ICD-10-CM | POA: Diagnosis not present

## 2014-08-06 DIAGNOSIS — M1990 Unspecified osteoarthritis, unspecified site: Secondary | ICD-10-CM | POA: Diagnosis not present

## 2014-08-06 DIAGNOSIS — E119 Type 2 diabetes mellitus without complications: Secondary | ICD-10-CM | POA: Diagnosis not present

## 2014-08-06 DIAGNOSIS — F0280 Dementia in other diseases classified elsewhere without behavioral disturbance: Secondary | ICD-10-CM | POA: Diagnosis not present

## 2014-08-06 DIAGNOSIS — G309 Alzheimer's disease, unspecified: Secondary | ICD-10-CM | POA: Diagnosis not present

## 2014-08-07 DIAGNOSIS — M1990 Unspecified osteoarthritis, unspecified site: Secondary | ICD-10-CM | POA: Diagnosis not present

## 2014-08-07 DIAGNOSIS — F0280 Dementia in other diseases classified elsewhere without behavioral disturbance: Secondary | ICD-10-CM | POA: Diagnosis not present

## 2014-08-07 DIAGNOSIS — G309 Alzheimer's disease, unspecified: Secondary | ICD-10-CM | POA: Diagnosis not present

## 2014-08-07 DIAGNOSIS — E119 Type 2 diabetes mellitus without complications: Secondary | ICD-10-CM | POA: Diagnosis not present

## 2014-08-08 DIAGNOSIS — M1990 Unspecified osteoarthritis, unspecified site: Secondary | ICD-10-CM | POA: Diagnosis not present

## 2014-08-08 DIAGNOSIS — G309 Alzheimer's disease, unspecified: Secondary | ICD-10-CM | POA: Diagnosis not present

## 2014-08-08 DIAGNOSIS — F0280 Dementia in other diseases classified elsewhere without behavioral disturbance: Secondary | ICD-10-CM | POA: Diagnosis not present

## 2014-08-08 DIAGNOSIS — E119 Type 2 diabetes mellitus without complications: Secondary | ICD-10-CM | POA: Diagnosis not present

## 2014-08-09 DIAGNOSIS — F0280 Dementia in other diseases classified elsewhere without behavioral disturbance: Secondary | ICD-10-CM | POA: Diagnosis not present

## 2014-08-09 DIAGNOSIS — M1990 Unspecified osteoarthritis, unspecified site: Secondary | ICD-10-CM | POA: Diagnosis not present

## 2014-08-09 DIAGNOSIS — G309 Alzheimer's disease, unspecified: Secondary | ICD-10-CM | POA: Diagnosis not present

## 2014-08-09 DIAGNOSIS — E119 Type 2 diabetes mellitus without complications: Secondary | ICD-10-CM | POA: Diagnosis not present

## 2014-08-10 ENCOUNTER — Other Ambulatory Visit: Payer: Self-pay | Admitting: *Deleted

## 2014-08-10 DIAGNOSIS — F0280 Dementia in other diseases classified elsewhere without behavioral disturbance: Secondary | ICD-10-CM | POA: Diagnosis not present

## 2014-08-10 DIAGNOSIS — E119 Type 2 diabetes mellitus without complications: Secondary | ICD-10-CM | POA: Diagnosis not present

## 2014-08-10 DIAGNOSIS — G309 Alzheimer's disease, unspecified: Secondary | ICD-10-CM | POA: Diagnosis not present

## 2014-08-10 DIAGNOSIS — M1990 Unspecified osteoarthritis, unspecified site: Secondary | ICD-10-CM | POA: Diagnosis not present

## 2014-08-11 DIAGNOSIS — F0280 Dementia in other diseases classified elsewhere without behavioral disturbance: Secondary | ICD-10-CM | POA: Diagnosis not present

## 2014-08-11 DIAGNOSIS — M1990 Unspecified osteoarthritis, unspecified site: Secondary | ICD-10-CM | POA: Diagnosis not present

## 2014-08-11 DIAGNOSIS — E119 Type 2 diabetes mellitus without complications: Secondary | ICD-10-CM | POA: Diagnosis not present

## 2014-08-11 DIAGNOSIS — G309 Alzheimer's disease, unspecified: Secondary | ICD-10-CM | POA: Diagnosis not present

## 2014-08-12 DIAGNOSIS — M1990 Unspecified osteoarthritis, unspecified site: Secondary | ICD-10-CM | POA: Diagnosis not present

## 2014-08-12 DIAGNOSIS — G309 Alzheimer's disease, unspecified: Secondary | ICD-10-CM | POA: Diagnosis not present

## 2014-08-12 DIAGNOSIS — E119 Type 2 diabetes mellitus without complications: Secondary | ICD-10-CM | POA: Diagnosis not present

## 2014-08-12 DIAGNOSIS — F0280 Dementia in other diseases classified elsewhere without behavioral disturbance: Secondary | ICD-10-CM | POA: Diagnosis not present

## 2014-08-13 DIAGNOSIS — G309 Alzheimer's disease, unspecified: Secondary | ICD-10-CM | POA: Diagnosis not present

## 2014-08-13 DIAGNOSIS — F0280 Dementia in other diseases classified elsewhere without behavioral disturbance: Secondary | ICD-10-CM | POA: Diagnosis not present

## 2014-08-13 DIAGNOSIS — M1990 Unspecified osteoarthritis, unspecified site: Secondary | ICD-10-CM | POA: Diagnosis not present

## 2014-08-13 DIAGNOSIS — E119 Type 2 diabetes mellitus without complications: Secondary | ICD-10-CM | POA: Diagnosis not present

## 2014-08-14 DIAGNOSIS — G309 Alzheimer's disease, unspecified: Secondary | ICD-10-CM | POA: Diagnosis not present

## 2014-08-14 DIAGNOSIS — M1990 Unspecified osteoarthritis, unspecified site: Secondary | ICD-10-CM | POA: Diagnosis not present

## 2014-08-14 DIAGNOSIS — E119 Type 2 diabetes mellitus without complications: Secondary | ICD-10-CM | POA: Diagnosis not present

## 2014-08-14 DIAGNOSIS — F0280 Dementia in other diseases classified elsewhere without behavioral disturbance: Secondary | ICD-10-CM | POA: Diagnosis not present

## 2014-08-17 DIAGNOSIS — G309 Alzheimer's disease, unspecified: Secondary | ICD-10-CM | POA: Diagnosis not present

## 2014-08-17 DIAGNOSIS — M1990 Unspecified osteoarthritis, unspecified site: Secondary | ICD-10-CM | POA: Diagnosis not present

## 2014-08-17 DIAGNOSIS — F0280 Dementia in other diseases classified elsewhere without behavioral disturbance: Secondary | ICD-10-CM | POA: Diagnosis not present

## 2014-08-17 DIAGNOSIS — E119 Type 2 diabetes mellitus without complications: Secondary | ICD-10-CM | POA: Diagnosis not present

## 2014-08-18 DIAGNOSIS — M1990 Unspecified osteoarthritis, unspecified site: Secondary | ICD-10-CM | POA: Diagnosis not present

## 2014-08-18 DIAGNOSIS — E119 Type 2 diabetes mellitus without complications: Secondary | ICD-10-CM | POA: Diagnosis not present

## 2014-08-18 DIAGNOSIS — F0280 Dementia in other diseases classified elsewhere without behavioral disturbance: Secondary | ICD-10-CM | POA: Diagnosis not present

## 2014-08-18 DIAGNOSIS — G309 Alzheimer's disease, unspecified: Secondary | ICD-10-CM | POA: Diagnosis not present

## 2014-08-19 DIAGNOSIS — E119 Type 2 diabetes mellitus without complications: Secondary | ICD-10-CM | POA: Diagnosis not present

## 2014-08-19 DIAGNOSIS — G309 Alzheimer's disease, unspecified: Secondary | ICD-10-CM | POA: Diagnosis not present

## 2014-08-19 DIAGNOSIS — F0280 Dementia in other diseases classified elsewhere without behavioral disturbance: Secondary | ICD-10-CM | POA: Diagnosis not present

## 2014-08-19 DIAGNOSIS — M1990 Unspecified osteoarthritis, unspecified site: Secondary | ICD-10-CM | POA: Diagnosis not present

## 2014-08-20 DIAGNOSIS — E119 Type 2 diabetes mellitus without complications: Secondary | ICD-10-CM | POA: Diagnosis not present

## 2014-08-20 DIAGNOSIS — M1990 Unspecified osteoarthritis, unspecified site: Secondary | ICD-10-CM | POA: Diagnosis not present

## 2014-08-20 DIAGNOSIS — G309 Alzheimer's disease, unspecified: Secondary | ICD-10-CM | POA: Diagnosis not present

## 2014-08-20 DIAGNOSIS — F0280 Dementia in other diseases classified elsewhere without behavioral disturbance: Secondary | ICD-10-CM | POA: Diagnosis not present

## 2014-08-21 DIAGNOSIS — G309 Alzheimer's disease, unspecified: Secondary | ICD-10-CM | POA: Diagnosis not present

## 2014-08-21 DIAGNOSIS — E119 Type 2 diabetes mellitus without complications: Secondary | ICD-10-CM | POA: Diagnosis not present

## 2014-08-21 DIAGNOSIS — M1990 Unspecified osteoarthritis, unspecified site: Secondary | ICD-10-CM | POA: Diagnosis not present

## 2014-08-21 DIAGNOSIS — F0280 Dementia in other diseases classified elsewhere without behavioral disturbance: Secondary | ICD-10-CM | POA: Diagnosis not present

## 2014-08-22 DIAGNOSIS — M1990 Unspecified osteoarthritis, unspecified site: Secondary | ICD-10-CM | POA: Diagnosis not present

## 2014-08-22 DIAGNOSIS — G309 Alzheimer's disease, unspecified: Secondary | ICD-10-CM | POA: Diagnosis not present

## 2014-08-22 DIAGNOSIS — F0280 Dementia in other diseases classified elsewhere without behavioral disturbance: Secondary | ICD-10-CM | POA: Diagnosis not present

## 2014-08-22 DIAGNOSIS — E119 Type 2 diabetes mellitus without complications: Secondary | ICD-10-CM | POA: Diagnosis not present

## 2014-08-23 DIAGNOSIS — M1990 Unspecified osteoarthritis, unspecified site: Secondary | ICD-10-CM | POA: Diagnosis not present

## 2014-08-23 DIAGNOSIS — G309 Alzheimer's disease, unspecified: Secondary | ICD-10-CM | POA: Diagnosis not present

## 2014-08-23 DIAGNOSIS — F0280 Dementia in other diseases classified elsewhere without behavioral disturbance: Secondary | ICD-10-CM | POA: Diagnosis not present

## 2014-08-23 DIAGNOSIS — E119 Type 2 diabetes mellitus without complications: Secondary | ICD-10-CM | POA: Diagnosis not present

## 2014-08-24 DIAGNOSIS — F0280 Dementia in other diseases classified elsewhere without behavioral disturbance: Secondary | ICD-10-CM | POA: Diagnosis not present

## 2014-08-24 DIAGNOSIS — M1990 Unspecified osteoarthritis, unspecified site: Secondary | ICD-10-CM | POA: Diagnosis not present

## 2014-08-24 DIAGNOSIS — G309 Alzheimer's disease, unspecified: Secondary | ICD-10-CM | POA: Diagnosis not present

## 2014-08-24 DIAGNOSIS — E119 Type 2 diabetes mellitus without complications: Secondary | ICD-10-CM | POA: Diagnosis not present

## 2014-08-25 DIAGNOSIS — E119 Type 2 diabetes mellitus without complications: Secondary | ICD-10-CM | POA: Diagnosis not present

## 2014-08-25 DIAGNOSIS — F0280 Dementia in other diseases classified elsewhere without behavioral disturbance: Secondary | ICD-10-CM | POA: Diagnosis not present

## 2014-08-25 DIAGNOSIS — G309 Alzheimer's disease, unspecified: Secondary | ICD-10-CM | POA: Diagnosis not present

## 2014-08-25 DIAGNOSIS — M1990 Unspecified osteoarthritis, unspecified site: Secondary | ICD-10-CM | POA: Diagnosis not present

## 2014-08-26 DIAGNOSIS — G309 Alzheimer's disease, unspecified: Secondary | ICD-10-CM | POA: Diagnosis not present

## 2014-08-26 DIAGNOSIS — M1990 Unspecified osteoarthritis, unspecified site: Secondary | ICD-10-CM | POA: Diagnosis not present

## 2014-08-26 DIAGNOSIS — F0280 Dementia in other diseases classified elsewhere without behavioral disturbance: Secondary | ICD-10-CM | POA: Diagnosis not present

## 2014-08-26 DIAGNOSIS — E119 Type 2 diabetes mellitus without complications: Secondary | ICD-10-CM | POA: Diagnosis not present

## 2014-08-27 DIAGNOSIS — F0280 Dementia in other diseases classified elsewhere without behavioral disturbance: Secondary | ICD-10-CM | POA: Diagnosis not present

## 2014-08-27 DIAGNOSIS — E119 Type 2 diabetes mellitus without complications: Secondary | ICD-10-CM | POA: Diagnosis not present

## 2014-08-27 DIAGNOSIS — M1990 Unspecified osteoarthritis, unspecified site: Secondary | ICD-10-CM | POA: Diagnosis not present

## 2014-08-27 DIAGNOSIS — G309 Alzheimer's disease, unspecified: Secondary | ICD-10-CM | POA: Diagnosis not present

## 2014-08-28 DIAGNOSIS — G309 Alzheimer's disease, unspecified: Secondary | ICD-10-CM | POA: Diagnosis not present

## 2014-08-28 DIAGNOSIS — F0280 Dementia in other diseases classified elsewhere without behavioral disturbance: Secondary | ICD-10-CM | POA: Diagnosis not present

## 2014-08-28 DIAGNOSIS — M1990 Unspecified osteoarthritis, unspecified site: Secondary | ICD-10-CM | POA: Diagnosis not present

## 2014-08-28 DIAGNOSIS — E119 Type 2 diabetes mellitus without complications: Secondary | ICD-10-CM | POA: Diagnosis not present

## 2014-08-29 DIAGNOSIS — E119 Type 2 diabetes mellitus without complications: Secondary | ICD-10-CM | POA: Diagnosis not present

## 2014-08-29 DIAGNOSIS — M1990 Unspecified osteoarthritis, unspecified site: Secondary | ICD-10-CM | POA: Diagnosis not present

## 2014-08-29 DIAGNOSIS — G309 Alzheimer's disease, unspecified: Secondary | ICD-10-CM | POA: Diagnosis not present

## 2014-08-29 DIAGNOSIS — F0280 Dementia in other diseases classified elsewhere without behavioral disturbance: Secondary | ICD-10-CM | POA: Diagnosis not present

## 2014-08-30 DIAGNOSIS — G309 Alzheimer's disease, unspecified: Secondary | ICD-10-CM | POA: Diagnosis not present

## 2014-08-30 DIAGNOSIS — F0280 Dementia in other diseases classified elsewhere without behavioral disturbance: Secondary | ICD-10-CM | POA: Diagnosis not present

## 2014-08-30 DIAGNOSIS — E119 Type 2 diabetes mellitus without complications: Secondary | ICD-10-CM | POA: Diagnosis not present

## 2014-08-30 DIAGNOSIS — M1990 Unspecified osteoarthritis, unspecified site: Secondary | ICD-10-CM | POA: Diagnosis not present

## 2014-08-31 DIAGNOSIS — F0280 Dementia in other diseases classified elsewhere without behavioral disturbance: Secondary | ICD-10-CM | POA: Diagnosis not present

## 2014-08-31 DIAGNOSIS — E119 Type 2 diabetes mellitus without complications: Secondary | ICD-10-CM | POA: Diagnosis not present

## 2014-08-31 DIAGNOSIS — M1990 Unspecified osteoarthritis, unspecified site: Secondary | ICD-10-CM | POA: Diagnosis not present

## 2014-08-31 DIAGNOSIS — G309 Alzheimer's disease, unspecified: Secondary | ICD-10-CM | POA: Diagnosis not present

## 2014-09-01 ENCOUNTER — Encounter: Payer: Self-pay | Admitting: Internal Medicine

## 2014-09-01 DIAGNOSIS — E119 Type 2 diabetes mellitus without complications: Secondary | ICD-10-CM | POA: Diagnosis not present

## 2014-09-01 DIAGNOSIS — M1990 Unspecified osteoarthritis, unspecified site: Secondary | ICD-10-CM | POA: Diagnosis not present

## 2014-09-01 DIAGNOSIS — F0280 Dementia in other diseases classified elsewhere without behavioral disturbance: Secondary | ICD-10-CM | POA: Diagnosis not present

## 2014-09-01 DIAGNOSIS — G309 Alzheimer's disease, unspecified: Secondary | ICD-10-CM | POA: Diagnosis not present

## 2014-09-02 DIAGNOSIS — M1990 Unspecified osteoarthritis, unspecified site: Secondary | ICD-10-CM | POA: Diagnosis not present

## 2014-09-02 DIAGNOSIS — G309 Alzheimer's disease, unspecified: Secondary | ICD-10-CM | POA: Diagnosis not present

## 2014-09-02 DIAGNOSIS — E119 Type 2 diabetes mellitus without complications: Secondary | ICD-10-CM | POA: Diagnosis not present

## 2014-09-02 DIAGNOSIS — F0280 Dementia in other diseases classified elsewhere without behavioral disturbance: Secondary | ICD-10-CM | POA: Diagnosis not present

## 2014-09-03 DIAGNOSIS — E119 Type 2 diabetes mellitus without complications: Secondary | ICD-10-CM | POA: Diagnosis not present

## 2014-09-03 DIAGNOSIS — F0280 Dementia in other diseases classified elsewhere without behavioral disturbance: Secondary | ICD-10-CM | POA: Diagnosis not present

## 2014-09-03 DIAGNOSIS — G309 Alzheimer's disease, unspecified: Secondary | ICD-10-CM | POA: Diagnosis not present

## 2014-09-03 DIAGNOSIS — M1990 Unspecified osteoarthritis, unspecified site: Secondary | ICD-10-CM | POA: Diagnosis not present

## 2014-09-04 DIAGNOSIS — E119 Type 2 diabetes mellitus without complications: Secondary | ICD-10-CM | POA: Diagnosis not present

## 2014-09-04 DIAGNOSIS — F0280 Dementia in other diseases classified elsewhere without behavioral disturbance: Secondary | ICD-10-CM | POA: Diagnosis not present

## 2014-09-04 DIAGNOSIS — G309 Alzheimer's disease, unspecified: Secondary | ICD-10-CM | POA: Diagnosis not present

## 2014-09-04 DIAGNOSIS — M1990 Unspecified osteoarthritis, unspecified site: Secondary | ICD-10-CM | POA: Diagnosis not present

## 2014-09-05 DIAGNOSIS — M1990 Unspecified osteoarthritis, unspecified site: Secondary | ICD-10-CM | POA: Diagnosis not present

## 2014-09-05 DIAGNOSIS — G309 Alzheimer's disease, unspecified: Secondary | ICD-10-CM | POA: Diagnosis not present

## 2014-09-05 DIAGNOSIS — F0280 Dementia in other diseases classified elsewhere without behavioral disturbance: Secondary | ICD-10-CM | POA: Diagnosis not present

## 2014-09-05 DIAGNOSIS — E119 Type 2 diabetes mellitus without complications: Secondary | ICD-10-CM | POA: Diagnosis not present

## 2014-09-06 DIAGNOSIS — M1990 Unspecified osteoarthritis, unspecified site: Secondary | ICD-10-CM | POA: Diagnosis not present

## 2014-09-06 DIAGNOSIS — E119 Type 2 diabetes mellitus without complications: Secondary | ICD-10-CM | POA: Diagnosis not present

## 2014-09-06 DIAGNOSIS — F0280 Dementia in other diseases classified elsewhere without behavioral disturbance: Secondary | ICD-10-CM | POA: Diagnosis not present

## 2014-09-06 DIAGNOSIS — G309 Alzheimer's disease, unspecified: Secondary | ICD-10-CM | POA: Diagnosis not present

## 2014-09-07 DIAGNOSIS — M1990 Unspecified osteoarthritis, unspecified site: Secondary | ICD-10-CM | POA: Diagnosis not present

## 2014-09-07 DIAGNOSIS — G309 Alzheimer's disease, unspecified: Secondary | ICD-10-CM | POA: Diagnosis not present

## 2014-09-07 DIAGNOSIS — E119 Type 2 diabetes mellitus without complications: Secondary | ICD-10-CM | POA: Diagnosis not present

## 2014-09-07 DIAGNOSIS — F0280 Dementia in other diseases classified elsewhere without behavioral disturbance: Secondary | ICD-10-CM | POA: Diagnosis not present

## 2014-09-08 DIAGNOSIS — F0280 Dementia in other diseases classified elsewhere without behavioral disturbance: Secondary | ICD-10-CM | POA: Diagnosis not present

## 2014-09-08 DIAGNOSIS — M1990 Unspecified osteoarthritis, unspecified site: Secondary | ICD-10-CM | POA: Diagnosis not present

## 2014-09-08 DIAGNOSIS — E119 Type 2 diabetes mellitus without complications: Secondary | ICD-10-CM | POA: Diagnosis not present

## 2014-09-08 DIAGNOSIS — G309 Alzheimer's disease, unspecified: Secondary | ICD-10-CM | POA: Diagnosis not present

## 2014-09-09 DIAGNOSIS — M1990 Unspecified osteoarthritis, unspecified site: Secondary | ICD-10-CM | POA: Diagnosis not present

## 2014-09-09 DIAGNOSIS — F0280 Dementia in other diseases classified elsewhere without behavioral disturbance: Secondary | ICD-10-CM | POA: Diagnosis not present

## 2014-09-09 DIAGNOSIS — G309 Alzheimer's disease, unspecified: Secondary | ICD-10-CM | POA: Diagnosis not present

## 2014-09-09 DIAGNOSIS — E119 Type 2 diabetes mellitus without complications: Secondary | ICD-10-CM | POA: Diagnosis not present

## 2014-09-10 DIAGNOSIS — G309 Alzheimer's disease, unspecified: Secondary | ICD-10-CM | POA: Diagnosis not present

## 2014-09-10 DIAGNOSIS — E119 Type 2 diabetes mellitus without complications: Secondary | ICD-10-CM | POA: Diagnosis not present

## 2014-09-10 DIAGNOSIS — M1990 Unspecified osteoarthritis, unspecified site: Secondary | ICD-10-CM | POA: Diagnosis not present

## 2014-09-10 DIAGNOSIS — F0280 Dementia in other diseases classified elsewhere without behavioral disturbance: Secondary | ICD-10-CM | POA: Diagnosis not present

## 2014-09-11 DIAGNOSIS — F0280 Dementia in other diseases classified elsewhere without behavioral disturbance: Secondary | ICD-10-CM | POA: Diagnosis not present

## 2014-09-11 DIAGNOSIS — G309 Alzheimer's disease, unspecified: Secondary | ICD-10-CM | POA: Diagnosis not present

## 2014-09-11 DIAGNOSIS — E119 Type 2 diabetes mellitus without complications: Secondary | ICD-10-CM | POA: Diagnosis not present

## 2014-09-11 DIAGNOSIS — M1990 Unspecified osteoarthritis, unspecified site: Secondary | ICD-10-CM | POA: Diagnosis not present

## 2014-09-12 DIAGNOSIS — G309 Alzheimer's disease, unspecified: Secondary | ICD-10-CM | POA: Diagnosis not present

## 2014-09-12 DIAGNOSIS — M1990 Unspecified osteoarthritis, unspecified site: Secondary | ICD-10-CM | POA: Diagnosis not present

## 2014-09-12 DIAGNOSIS — E119 Type 2 diabetes mellitus without complications: Secondary | ICD-10-CM | POA: Diagnosis not present

## 2014-09-12 DIAGNOSIS — F0280 Dementia in other diseases classified elsewhere without behavioral disturbance: Secondary | ICD-10-CM | POA: Diagnosis not present

## 2014-09-13 DIAGNOSIS — F0280 Dementia in other diseases classified elsewhere without behavioral disturbance: Secondary | ICD-10-CM | POA: Diagnosis not present

## 2014-09-13 DIAGNOSIS — G309 Alzheimer's disease, unspecified: Secondary | ICD-10-CM | POA: Diagnosis not present

## 2014-09-13 DIAGNOSIS — M1990 Unspecified osteoarthritis, unspecified site: Secondary | ICD-10-CM | POA: Diagnosis not present

## 2014-09-13 DIAGNOSIS — E119 Type 2 diabetes mellitus without complications: Secondary | ICD-10-CM | POA: Diagnosis not present

## 2014-09-14 DIAGNOSIS — G309 Alzheimer's disease, unspecified: Secondary | ICD-10-CM | POA: Diagnosis not present

## 2014-09-14 DIAGNOSIS — F0280 Dementia in other diseases classified elsewhere without behavioral disturbance: Secondary | ICD-10-CM | POA: Diagnosis not present

## 2014-09-14 DIAGNOSIS — M1990 Unspecified osteoarthritis, unspecified site: Secondary | ICD-10-CM | POA: Diagnosis not present

## 2014-09-14 DIAGNOSIS — E119 Type 2 diabetes mellitus without complications: Secondary | ICD-10-CM | POA: Diagnosis not present

## 2014-09-15 DIAGNOSIS — G309 Alzheimer's disease, unspecified: Secondary | ICD-10-CM | POA: Diagnosis not present

## 2014-09-15 DIAGNOSIS — E119 Type 2 diabetes mellitus without complications: Secondary | ICD-10-CM | POA: Diagnosis not present

## 2014-09-15 DIAGNOSIS — F0280 Dementia in other diseases classified elsewhere without behavioral disturbance: Secondary | ICD-10-CM | POA: Diagnosis not present

## 2014-09-15 DIAGNOSIS — M1990 Unspecified osteoarthritis, unspecified site: Secondary | ICD-10-CM | POA: Diagnosis not present

## 2014-09-16 DIAGNOSIS — M1990 Unspecified osteoarthritis, unspecified site: Secondary | ICD-10-CM | POA: Diagnosis not present

## 2014-09-16 DIAGNOSIS — G309 Alzheimer's disease, unspecified: Secondary | ICD-10-CM | POA: Diagnosis not present

## 2014-09-16 DIAGNOSIS — E119 Type 2 diabetes mellitus without complications: Secondary | ICD-10-CM | POA: Diagnosis not present

## 2014-09-16 DIAGNOSIS — F0280 Dementia in other diseases classified elsewhere without behavioral disturbance: Secondary | ICD-10-CM | POA: Diagnosis not present

## 2014-09-17 DIAGNOSIS — F0280 Dementia in other diseases classified elsewhere without behavioral disturbance: Secondary | ICD-10-CM | POA: Diagnosis not present

## 2014-09-17 DIAGNOSIS — G309 Alzheimer's disease, unspecified: Secondary | ICD-10-CM | POA: Diagnosis not present

## 2014-09-17 DIAGNOSIS — M1990 Unspecified osteoarthritis, unspecified site: Secondary | ICD-10-CM | POA: Diagnosis not present

## 2014-09-17 DIAGNOSIS — E119 Type 2 diabetes mellitus without complications: Secondary | ICD-10-CM | POA: Diagnosis not present

## 2014-09-18 DIAGNOSIS — F0280 Dementia in other diseases classified elsewhere without behavioral disturbance: Secondary | ICD-10-CM | POA: Diagnosis not present

## 2014-09-18 DIAGNOSIS — E119 Type 2 diabetes mellitus without complications: Secondary | ICD-10-CM | POA: Diagnosis not present

## 2014-09-18 DIAGNOSIS — G309 Alzheimer's disease, unspecified: Secondary | ICD-10-CM | POA: Diagnosis not present

## 2014-09-18 DIAGNOSIS — M1990 Unspecified osteoarthritis, unspecified site: Secondary | ICD-10-CM | POA: Diagnosis not present

## 2014-09-19 DIAGNOSIS — M1990 Unspecified osteoarthritis, unspecified site: Secondary | ICD-10-CM | POA: Diagnosis not present

## 2014-09-19 DIAGNOSIS — G309 Alzheimer's disease, unspecified: Secondary | ICD-10-CM | POA: Diagnosis not present

## 2014-09-19 DIAGNOSIS — E119 Type 2 diabetes mellitus without complications: Secondary | ICD-10-CM | POA: Diagnosis not present

## 2014-09-19 DIAGNOSIS — F0280 Dementia in other diseases classified elsewhere without behavioral disturbance: Secondary | ICD-10-CM | POA: Diagnosis not present

## 2014-09-20 DIAGNOSIS — G309 Alzheimer's disease, unspecified: Secondary | ICD-10-CM | POA: Diagnosis not present

## 2014-09-20 DIAGNOSIS — F0280 Dementia in other diseases classified elsewhere without behavioral disturbance: Secondary | ICD-10-CM | POA: Diagnosis not present

## 2014-09-20 DIAGNOSIS — M1990 Unspecified osteoarthritis, unspecified site: Secondary | ICD-10-CM | POA: Diagnosis not present

## 2014-09-20 DIAGNOSIS — E119 Type 2 diabetes mellitus without complications: Secondary | ICD-10-CM | POA: Diagnosis not present

## 2014-09-21 DIAGNOSIS — G309 Alzheimer's disease, unspecified: Secondary | ICD-10-CM | POA: Diagnosis not present

## 2014-09-21 DIAGNOSIS — F0280 Dementia in other diseases classified elsewhere without behavioral disturbance: Secondary | ICD-10-CM | POA: Diagnosis not present

## 2014-09-21 DIAGNOSIS — E119 Type 2 diabetes mellitus without complications: Secondary | ICD-10-CM | POA: Diagnosis not present

## 2014-09-21 DIAGNOSIS — M1990 Unspecified osteoarthritis, unspecified site: Secondary | ICD-10-CM | POA: Diagnosis not present

## 2014-09-22 DIAGNOSIS — G309 Alzheimer's disease, unspecified: Secondary | ICD-10-CM | POA: Diagnosis not present

## 2014-09-22 DIAGNOSIS — E119 Type 2 diabetes mellitus without complications: Secondary | ICD-10-CM | POA: Diagnosis not present

## 2014-09-22 DIAGNOSIS — F0280 Dementia in other diseases classified elsewhere without behavioral disturbance: Secondary | ICD-10-CM | POA: Diagnosis not present

## 2014-09-22 DIAGNOSIS — M1990 Unspecified osteoarthritis, unspecified site: Secondary | ICD-10-CM | POA: Diagnosis not present

## 2014-09-23 DIAGNOSIS — E119 Type 2 diabetes mellitus without complications: Secondary | ICD-10-CM | POA: Diagnosis not present

## 2014-09-23 DIAGNOSIS — M1990 Unspecified osteoarthritis, unspecified site: Secondary | ICD-10-CM | POA: Diagnosis not present

## 2014-09-23 DIAGNOSIS — G309 Alzheimer's disease, unspecified: Secondary | ICD-10-CM | POA: Diagnosis not present

## 2014-09-23 DIAGNOSIS — F0280 Dementia in other diseases classified elsewhere without behavioral disturbance: Secondary | ICD-10-CM | POA: Diagnosis not present

## 2014-09-24 ENCOUNTER — Encounter: Payer: Self-pay | Admitting: Internal Medicine

## 2014-09-24 ENCOUNTER — Non-Acute Institutional Stay: Payer: Medicare Other | Admitting: Internal Medicine

## 2014-09-24 VITALS — BP 138/60 | HR 66 | Resp 22

## 2014-09-24 DIAGNOSIS — G309 Alzheimer's disease, unspecified: Secondary | ICD-10-CM | POA: Diagnosis not present

## 2014-09-24 DIAGNOSIS — E43 Unspecified severe protein-calorie malnutrition: Secondary | ICD-10-CM | POA: Diagnosis not present

## 2014-09-24 DIAGNOSIS — M170 Bilateral primary osteoarthritis of knee: Secondary | ICD-10-CM

## 2014-09-24 DIAGNOSIS — E119 Type 2 diabetes mellitus without complications: Secondary | ICD-10-CM

## 2014-09-24 DIAGNOSIS — M1990 Unspecified osteoarthritis, unspecified site: Secondary | ICD-10-CM | POA: Diagnosis not present

## 2014-09-24 DIAGNOSIS — M24561 Contracture, right knee: Secondary | ICD-10-CM

## 2014-09-24 DIAGNOSIS — F0280 Dementia in other diseases classified elsewhere without behavioral disturbance: Secondary | ICD-10-CM | POA: Diagnosis not present

## 2014-09-24 NOTE — Assessment & Plan Note (Signed)
Severe and seems to be end stage May need to consider SNF----or even hospice home for symptom relief (as she may be approaching end of life) Total care Hospice

## 2014-09-24 NOTE — Assessment & Plan Note (Signed)
Not really an issue Will continue metformin to prevent very high sugars

## 2014-09-24 NOTE — Assessment & Plan Note (Signed)
Will increase medications for pain

## 2014-09-24 NOTE — Assessment & Plan Note (Addendum)
Pain seems to be more of an issue Will increase tramadol to 100mg  tid If still in pain, will add fentanyl patch 12 mcg May need transfer if acute pain issues are a limiting factor She may benefit from staying just in bed

## 2014-09-24 NOTE — Assessment & Plan Note (Signed)
Still losing weight and muscle mass despite eating

## 2014-09-24 NOTE — Progress Notes (Signed)
Subjective:    Patient ID: Tanya Clayton, female    DOB: Oct 13, 1935, 79 y.o.   MRN: [id number]  HPI Visit her for follow up of severe dementia Son is here  Back on hospice of ---poor care with the other hospice group Ongoing decline in status Needs assist of 2 for transfers-- not bearing weight Incontinent of bowel and bladder Needs to be fed--but seems to eat well Despite that--has had clear loss of weight  Sugars have been okay No apparent hypoglycemia  Clearly more pain Certain positions are tough--even just sitting in wheelchair  Leans to right all the time Usually feels better when in bed Not as much relief from the tramadol  Current Outpatient Prescriptions on File Prior to Visit  Medication Sig Dispense Refill  . Alcohol Swabs (B-D SINGLE USE SWABS REGULAR) PADS FINGERSTICK BLOOD SUGAR TEST ONCE DAILY. 100 each 11  . bisacodyl (DULCOLAX) 10 MG suppository Place 10 mg rectally every 3 (three) days as needed for constipation.    Marland Kitchen glucose blood test strip True Track test strips; pt test blood sugar twice daily and as directed.250.00 100 each 3  . Lancets Misc. (UNISTIK 3 COMFORT) MISC Fingerstick blood sugar test once daily 100 each 1  . metFORMIN (GLUCOPHAGE) 500 MG tablet Take 500 mg by mouth daily with breakfast.    . MONOJECT INS SYR .5CC/30G 30G X 5/16" 0.5 ML MISC USE AS DIRECTED 100 each PRN  . polyethylene glycol (MIRALAX / GLYCOLAX) packet Take 17 g by mouth daily.    . Skin Protectants, Misc. (ENDIT EX) Apply topically 4 (four) times daily as needed (until open area resolves).    . triamcinolone lotion (KENALOG) 0.1 % Apply 1 application topically 3 (three) times daily as needed.     No current facility-administered medications on file prior to visit.    No Known Allergies  Past Medical History  Diagnosis Date  . Alzheimer's disease 2006  . Breast cancer     Dr Grayland Ormond  . Allergy   . Arthritis   . Diabetes mellitus   . Sacral decubitus ulcer,  stage II 02/2013    Past Surgical History  Procedure Laterality Date  . Mastectomy  2007    Right--with implant  . Abdominal hysterectomy  1960's    Family History  Problem Relation Age of Onset  . Diabetes Mother     Social History   Social History  . Marital Status: Widowed    Spouse Name: N/A  . Number of Children: 2  . Years of Education: N/A   Occupational History  . Schoolteacher     retired   Social History Main Topics  . Smoking status: Never Smoker   . Smokeless tobacco: Never Used  . Alcohol Use: No  . Drug Use: Not on file  . Sexual Activity: Not on file   Other Topics Concern  . Not on file   Social History Narrative   1 son, 1 step daughter   Has DNR--done 10/10/12         Review of Systems No problems with bowels No skin ulcers Did have rash in sacral area--using barrier cream (?stage 1)    Objective:   Physical Exam  Constitutional:  Marked wasting all over--esp arms and legs  Neck: No thyromegaly present.  Cardiovascular: Normal rate and regular rhythm.  Exam reveals no gallop.   Faint pedal pulses still ?faint systolic murmur LLSB  Pulmonary/Chest: Effort normal and breath sounds normal. No respiratory distress. She  has no wheezes. She has no rales.  Abdominal: Soft. There is no tenderness.  Musculoskeletal: She exhibits no edema.  Marked pain with manipulation of right knee. Mild contracture right knee  Lymphadenopathy:    She has no cervical adenopathy.  Neurological:  Doesn't open eyes or engage at all Increased tone throughout  Psychiatric:  Not able to judge affect. Son notes smiles when not in pain          Assessment & Plan:

## 2014-09-25 DIAGNOSIS — M1990 Unspecified osteoarthritis, unspecified site: Secondary | ICD-10-CM | POA: Diagnosis not present

## 2014-09-25 DIAGNOSIS — E119 Type 2 diabetes mellitus without complications: Secondary | ICD-10-CM | POA: Diagnosis not present

## 2014-09-25 DIAGNOSIS — G309 Alzheimer's disease, unspecified: Secondary | ICD-10-CM | POA: Diagnosis not present

## 2014-09-25 DIAGNOSIS — F0280 Dementia in other diseases classified elsewhere without behavioral disturbance: Secondary | ICD-10-CM | POA: Diagnosis not present

## 2014-09-26 DIAGNOSIS — F0280 Dementia in other diseases classified elsewhere without behavioral disturbance: Secondary | ICD-10-CM | POA: Diagnosis not present

## 2014-09-26 DIAGNOSIS — M1990 Unspecified osteoarthritis, unspecified site: Secondary | ICD-10-CM | POA: Diagnosis not present

## 2014-09-26 DIAGNOSIS — G309 Alzheimer's disease, unspecified: Secondary | ICD-10-CM | POA: Diagnosis not present

## 2014-09-26 DIAGNOSIS — E119 Type 2 diabetes mellitus without complications: Secondary | ICD-10-CM | POA: Diagnosis not present

## 2014-09-27 DIAGNOSIS — G309 Alzheimer's disease, unspecified: Secondary | ICD-10-CM | POA: Diagnosis not present

## 2014-09-27 DIAGNOSIS — E119 Type 2 diabetes mellitus without complications: Secondary | ICD-10-CM | POA: Diagnosis not present

## 2014-09-27 DIAGNOSIS — M1990 Unspecified osteoarthritis, unspecified site: Secondary | ICD-10-CM | POA: Diagnosis not present

## 2014-09-27 DIAGNOSIS — F0280 Dementia in other diseases classified elsewhere without behavioral disturbance: Secondary | ICD-10-CM | POA: Diagnosis not present

## 2014-09-28 DIAGNOSIS — F0280 Dementia in other diseases classified elsewhere without behavioral disturbance: Secondary | ICD-10-CM | POA: Diagnosis not present

## 2014-09-28 DIAGNOSIS — E119 Type 2 diabetes mellitus without complications: Secondary | ICD-10-CM | POA: Diagnosis not present

## 2014-09-28 DIAGNOSIS — G309 Alzheimer's disease, unspecified: Secondary | ICD-10-CM | POA: Diagnosis not present

## 2014-09-28 DIAGNOSIS — M1990 Unspecified osteoarthritis, unspecified site: Secondary | ICD-10-CM | POA: Diagnosis not present

## 2014-09-29 DIAGNOSIS — F0280 Dementia in other diseases classified elsewhere without behavioral disturbance: Secondary | ICD-10-CM | POA: Diagnosis not present

## 2014-09-29 DIAGNOSIS — G309 Alzheimer's disease, unspecified: Secondary | ICD-10-CM | POA: Diagnosis not present

## 2014-09-29 DIAGNOSIS — E119 Type 2 diabetes mellitus without complications: Secondary | ICD-10-CM | POA: Diagnosis not present

## 2014-09-29 DIAGNOSIS — M1990 Unspecified osteoarthritis, unspecified site: Secondary | ICD-10-CM | POA: Diagnosis not present

## 2014-09-30 DIAGNOSIS — M1990 Unspecified osteoarthritis, unspecified site: Secondary | ICD-10-CM | POA: Diagnosis not present

## 2014-09-30 DIAGNOSIS — G309 Alzheimer's disease, unspecified: Secondary | ICD-10-CM | POA: Diagnosis not present

## 2014-09-30 DIAGNOSIS — E119 Type 2 diabetes mellitus without complications: Secondary | ICD-10-CM | POA: Diagnosis not present

## 2014-09-30 DIAGNOSIS — F0280 Dementia in other diseases classified elsewhere without behavioral disturbance: Secondary | ICD-10-CM | POA: Diagnosis not present

## 2014-10-01 DIAGNOSIS — E119 Type 2 diabetes mellitus without complications: Secondary | ICD-10-CM | POA: Diagnosis not present

## 2014-10-01 DIAGNOSIS — G309 Alzheimer's disease, unspecified: Secondary | ICD-10-CM | POA: Diagnosis not present

## 2014-10-01 DIAGNOSIS — M1990 Unspecified osteoarthritis, unspecified site: Secondary | ICD-10-CM | POA: Diagnosis not present

## 2014-10-01 DIAGNOSIS — F0280 Dementia in other diseases classified elsewhere without behavioral disturbance: Secondary | ICD-10-CM | POA: Diagnosis not present

## 2014-10-02 DIAGNOSIS — G309 Alzheimer's disease, unspecified: Secondary | ICD-10-CM | POA: Diagnosis not present

## 2014-10-02 DIAGNOSIS — M1990 Unspecified osteoarthritis, unspecified site: Secondary | ICD-10-CM | POA: Diagnosis not present

## 2014-10-02 DIAGNOSIS — F0280 Dementia in other diseases classified elsewhere without behavioral disturbance: Secondary | ICD-10-CM | POA: Diagnosis not present

## 2014-10-02 DIAGNOSIS — E119 Type 2 diabetes mellitus without complications: Secondary | ICD-10-CM | POA: Diagnosis not present

## 2014-10-03 DIAGNOSIS — G309 Alzheimer's disease, unspecified: Secondary | ICD-10-CM | POA: Diagnosis not present

## 2014-10-03 DIAGNOSIS — M1990 Unspecified osteoarthritis, unspecified site: Secondary | ICD-10-CM | POA: Diagnosis not present

## 2014-10-03 DIAGNOSIS — F0280 Dementia in other diseases classified elsewhere without behavioral disturbance: Secondary | ICD-10-CM | POA: Diagnosis not present

## 2014-10-03 DIAGNOSIS — E119 Type 2 diabetes mellitus without complications: Secondary | ICD-10-CM | POA: Diagnosis not present

## 2014-10-04 DIAGNOSIS — M1990 Unspecified osteoarthritis, unspecified site: Secondary | ICD-10-CM | POA: Diagnosis not present

## 2014-10-04 DIAGNOSIS — G309 Alzheimer's disease, unspecified: Secondary | ICD-10-CM | POA: Diagnosis not present

## 2014-10-04 DIAGNOSIS — F0280 Dementia in other diseases classified elsewhere without behavioral disturbance: Secondary | ICD-10-CM | POA: Diagnosis not present

## 2014-10-04 DIAGNOSIS — E119 Type 2 diabetes mellitus without complications: Secondary | ICD-10-CM | POA: Diagnosis not present

## 2014-10-05 DIAGNOSIS — F0280 Dementia in other diseases classified elsewhere without behavioral disturbance: Secondary | ICD-10-CM | POA: Diagnosis not present

## 2014-10-05 DIAGNOSIS — G309 Alzheimer's disease, unspecified: Secondary | ICD-10-CM | POA: Diagnosis not present

## 2014-10-05 DIAGNOSIS — E119 Type 2 diabetes mellitus without complications: Secondary | ICD-10-CM | POA: Diagnosis not present

## 2014-10-05 DIAGNOSIS — M1990 Unspecified osteoarthritis, unspecified site: Secondary | ICD-10-CM | POA: Diagnosis not present

## 2014-10-06 DIAGNOSIS — M1990 Unspecified osteoarthritis, unspecified site: Secondary | ICD-10-CM | POA: Diagnosis not present

## 2014-10-06 DIAGNOSIS — G309 Alzheimer's disease, unspecified: Secondary | ICD-10-CM | POA: Diagnosis not present

## 2014-10-06 DIAGNOSIS — E119 Type 2 diabetes mellitus without complications: Secondary | ICD-10-CM | POA: Diagnosis not present

## 2014-10-06 DIAGNOSIS — F0280 Dementia in other diseases classified elsewhere without behavioral disturbance: Secondary | ICD-10-CM | POA: Diagnosis not present

## 2014-10-07 DIAGNOSIS — M1990 Unspecified osteoarthritis, unspecified site: Secondary | ICD-10-CM | POA: Diagnosis not present

## 2014-10-07 DIAGNOSIS — G309 Alzheimer's disease, unspecified: Secondary | ICD-10-CM | POA: Diagnosis not present

## 2014-10-07 DIAGNOSIS — F0280 Dementia in other diseases classified elsewhere without behavioral disturbance: Secondary | ICD-10-CM | POA: Diagnosis not present

## 2014-10-07 DIAGNOSIS — E119 Type 2 diabetes mellitus without complications: Secondary | ICD-10-CM | POA: Diagnosis not present

## 2014-10-08 DIAGNOSIS — M1990 Unspecified osteoarthritis, unspecified site: Secondary | ICD-10-CM | POA: Diagnosis not present

## 2014-10-08 DIAGNOSIS — G309 Alzheimer's disease, unspecified: Secondary | ICD-10-CM | POA: Diagnosis not present

## 2014-10-08 DIAGNOSIS — E119 Type 2 diabetes mellitus without complications: Secondary | ICD-10-CM | POA: Diagnosis not present

## 2014-10-08 DIAGNOSIS — F0280 Dementia in other diseases classified elsewhere without behavioral disturbance: Secondary | ICD-10-CM | POA: Diagnosis not present

## 2014-10-09 ENCOUNTER — Telehealth: Payer: Self-pay | Admitting: Internal Medicine

## 2014-10-09 DIAGNOSIS — G309 Alzheimer's disease, unspecified: Secondary | ICD-10-CM | POA: Diagnosis not present

## 2014-10-09 DIAGNOSIS — E119 Type 2 diabetes mellitus without complications: Secondary | ICD-10-CM | POA: Diagnosis not present

## 2014-10-09 DIAGNOSIS — F0280 Dementia in other diseases classified elsewhere without behavioral disturbance: Secondary | ICD-10-CM | POA: Diagnosis not present

## 2014-10-09 DIAGNOSIS — M1990 Unspecified osteoarthritis, unspecified site: Secondary | ICD-10-CM | POA: Diagnosis not present

## 2014-10-09 MED ORDER — FENTANYL 12 MCG/HR TD PT72: 12.5000 ug | MEDICATED_PATCH | TRANSDERMAL | Status: DC

## 2014-10-09 NOTE — Telephone Encounter (Signed)
Note from [facility] More pain despite increased tramadol Will change to fentanyl patch and use the tramadol just prn

## 2014-10-10 DIAGNOSIS — G309 Alzheimer's disease, unspecified: Secondary | ICD-10-CM | POA: Diagnosis not present

## 2014-10-10 DIAGNOSIS — E119 Type 2 diabetes mellitus without complications: Secondary | ICD-10-CM | POA: Diagnosis not present

## 2014-10-10 DIAGNOSIS — M1990 Unspecified osteoarthritis, unspecified site: Secondary | ICD-10-CM | POA: Diagnosis not present

## 2014-10-10 DIAGNOSIS — F0280 Dementia in other diseases classified elsewhere without behavioral disturbance: Secondary | ICD-10-CM | POA: Diagnosis not present

## 2014-10-11 DIAGNOSIS — F0280 Dementia in other diseases classified elsewhere without behavioral disturbance: Secondary | ICD-10-CM | POA: Diagnosis not present

## 2014-10-11 DIAGNOSIS — E119 Type 2 diabetes mellitus without complications: Secondary | ICD-10-CM | POA: Diagnosis not present

## 2014-10-11 DIAGNOSIS — G309 Alzheimer's disease, unspecified: Secondary | ICD-10-CM | POA: Diagnosis not present

## 2014-10-11 DIAGNOSIS — M1990 Unspecified osteoarthritis, unspecified site: Secondary | ICD-10-CM | POA: Diagnosis not present

## 2014-10-12 DIAGNOSIS — G309 Alzheimer's disease, unspecified: Secondary | ICD-10-CM | POA: Diagnosis not present

## 2014-10-12 DIAGNOSIS — M1990 Unspecified osteoarthritis, unspecified site: Secondary | ICD-10-CM | POA: Diagnosis not present

## 2014-10-12 DIAGNOSIS — E119 Type 2 diabetes mellitus without complications: Secondary | ICD-10-CM | POA: Diagnosis not present

## 2014-10-12 DIAGNOSIS — F0280 Dementia in other diseases classified elsewhere without behavioral disturbance: Secondary | ICD-10-CM | POA: Diagnosis not present

## 2014-10-13 DIAGNOSIS — E119 Type 2 diabetes mellitus without complications: Secondary | ICD-10-CM | POA: Diagnosis not present

## 2014-10-13 DIAGNOSIS — G309 Alzheimer's disease, unspecified: Secondary | ICD-10-CM | POA: Diagnosis not present

## 2014-10-13 DIAGNOSIS — F0280 Dementia in other diseases classified elsewhere without behavioral disturbance: Secondary | ICD-10-CM | POA: Diagnosis not present

## 2014-10-13 DIAGNOSIS — M1990 Unspecified osteoarthritis, unspecified site: Secondary | ICD-10-CM | POA: Diagnosis not present

## 2014-10-14 DIAGNOSIS — G309 Alzheimer's disease, unspecified: Secondary | ICD-10-CM | POA: Diagnosis not present

## 2014-10-14 DIAGNOSIS — E119 Type 2 diabetes mellitus without complications: Secondary | ICD-10-CM | POA: Diagnosis not present

## 2014-10-14 DIAGNOSIS — M1990 Unspecified osteoarthritis, unspecified site: Secondary | ICD-10-CM | POA: Diagnosis not present

## 2014-10-14 DIAGNOSIS — F0280 Dementia in other diseases classified elsewhere without behavioral disturbance: Secondary | ICD-10-CM | POA: Diagnosis not present

## 2014-10-15 DIAGNOSIS — M1990 Unspecified osteoarthritis, unspecified site: Secondary | ICD-10-CM | POA: Diagnosis not present

## 2014-10-15 DIAGNOSIS — G309 Alzheimer's disease, unspecified: Secondary | ICD-10-CM | POA: Diagnosis not present

## 2014-10-15 DIAGNOSIS — E119 Type 2 diabetes mellitus without complications: Secondary | ICD-10-CM | POA: Diagnosis not present

## 2014-10-15 DIAGNOSIS — F0280 Dementia in other diseases classified elsewhere without behavioral disturbance: Secondary | ICD-10-CM | POA: Diagnosis not present

## 2014-10-16 DIAGNOSIS — M1990 Unspecified osteoarthritis, unspecified site: Secondary | ICD-10-CM | POA: Diagnosis not present

## 2014-10-16 DIAGNOSIS — F0280 Dementia in other diseases classified elsewhere without behavioral disturbance: Secondary | ICD-10-CM | POA: Diagnosis not present

## 2014-10-16 DIAGNOSIS — G309 Alzheimer's disease, unspecified: Secondary | ICD-10-CM | POA: Diagnosis not present

## 2014-10-16 DIAGNOSIS — E119 Type 2 diabetes mellitus without complications: Secondary | ICD-10-CM | POA: Diagnosis not present

## 2014-10-16 DIAGNOSIS — Z23 Encounter for immunization: Secondary | ICD-10-CM | POA: Diagnosis not present

## 2014-10-17 DIAGNOSIS — E119 Type 2 diabetes mellitus without complications: Secondary | ICD-10-CM | POA: Diagnosis not present

## 2014-10-17 DIAGNOSIS — G309 Alzheimer's disease, unspecified: Secondary | ICD-10-CM | POA: Diagnosis not present

## 2014-10-17 DIAGNOSIS — F0280 Dementia in other diseases classified elsewhere without behavioral disturbance: Secondary | ICD-10-CM | POA: Diagnosis not present

## 2014-10-17 DIAGNOSIS — M1990 Unspecified osteoarthritis, unspecified site: Secondary | ICD-10-CM | POA: Diagnosis not present

## 2014-10-18 DIAGNOSIS — M1990 Unspecified osteoarthritis, unspecified site: Secondary | ICD-10-CM | POA: Diagnosis not present

## 2014-10-18 DIAGNOSIS — F0280 Dementia in other diseases classified elsewhere without behavioral disturbance: Secondary | ICD-10-CM | POA: Diagnosis not present

## 2014-10-18 DIAGNOSIS — E119 Type 2 diabetes mellitus without complications: Secondary | ICD-10-CM | POA: Diagnosis not present

## 2014-10-18 DIAGNOSIS — G309 Alzheimer's disease, unspecified: Secondary | ICD-10-CM | POA: Diagnosis not present

## 2014-10-19 DIAGNOSIS — G309 Alzheimer's disease, unspecified: Secondary | ICD-10-CM | POA: Diagnosis not present

## 2014-10-19 DIAGNOSIS — F0280 Dementia in other diseases classified elsewhere without behavioral disturbance: Secondary | ICD-10-CM | POA: Diagnosis not present

## 2014-10-19 DIAGNOSIS — M1990 Unspecified osteoarthritis, unspecified site: Secondary | ICD-10-CM | POA: Diagnosis not present

## 2014-10-19 DIAGNOSIS — E119 Type 2 diabetes mellitus without complications: Secondary | ICD-10-CM | POA: Diagnosis not present

## 2014-10-20 DIAGNOSIS — F0280 Dementia in other diseases classified elsewhere without behavioral disturbance: Secondary | ICD-10-CM | POA: Diagnosis not present

## 2014-10-20 DIAGNOSIS — M1990 Unspecified osteoarthritis, unspecified site: Secondary | ICD-10-CM | POA: Diagnosis not present

## 2014-10-20 DIAGNOSIS — E119 Type 2 diabetes mellitus without complications: Secondary | ICD-10-CM | POA: Diagnosis not present

## 2014-10-20 DIAGNOSIS — G309 Alzheimer's disease, unspecified: Secondary | ICD-10-CM | POA: Diagnosis not present

## 2014-10-21 DIAGNOSIS — G309 Alzheimer's disease, unspecified: Secondary | ICD-10-CM | POA: Diagnosis not present

## 2014-10-21 DIAGNOSIS — E119 Type 2 diabetes mellitus without complications: Secondary | ICD-10-CM | POA: Diagnosis not present

## 2014-10-21 DIAGNOSIS — M1990 Unspecified osteoarthritis, unspecified site: Secondary | ICD-10-CM | POA: Diagnosis not present

## 2014-10-21 DIAGNOSIS — F0280 Dementia in other diseases classified elsewhere without behavioral disturbance: Secondary | ICD-10-CM | POA: Diagnosis not present

## 2014-10-22 DIAGNOSIS — E119 Type 2 diabetes mellitus without complications: Secondary | ICD-10-CM | POA: Diagnosis not present

## 2014-10-22 DIAGNOSIS — G309 Alzheimer's disease, unspecified: Secondary | ICD-10-CM | POA: Diagnosis not present

## 2014-10-22 DIAGNOSIS — M1990 Unspecified osteoarthritis, unspecified site: Secondary | ICD-10-CM | POA: Diagnosis not present

## 2014-10-22 DIAGNOSIS — F0280 Dementia in other diseases classified elsewhere without behavioral disturbance: Secondary | ICD-10-CM | POA: Diagnosis not present

## 2014-10-23 DIAGNOSIS — F0280 Dementia in other diseases classified elsewhere without behavioral disturbance: Secondary | ICD-10-CM | POA: Diagnosis not present

## 2014-10-23 DIAGNOSIS — M1990 Unspecified osteoarthritis, unspecified site: Secondary | ICD-10-CM | POA: Diagnosis not present

## 2014-10-23 DIAGNOSIS — E119 Type 2 diabetes mellitus without complications: Secondary | ICD-10-CM | POA: Diagnosis not present

## 2014-10-23 DIAGNOSIS — G309 Alzheimer's disease, unspecified: Secondary | ICD-10-CM | POA: Diagnosis not present

## 2014-10-24 DIAGNOSIS — E119 Type 2 diabetes mellitus without complications: Secondary | ICD-10-CM | POA: Diagnosis not present

## 2014-10-24 DIAGNOSIS — G309 Alzheimer's disease, unspecified: Secondary | ICD-10-CM | POA: Diagnosis not present

## 2014-10-24 DIAGNOSIS — M1990 Unspecified osteoarthritis, unspecified site: Secondary | ICD-10-CM | POA: Diagnosis not present

## 2014-10-24 DIAGNOSIS — F0280 Dementia in other diseases classified elsewhere without behavioral disturbance: Secondary | ICD-10-CM | POA: Diagnosis not present

## 2014-10-25 DIAGNOSIS — E119 Type 2 diabetes mellitus without complications: Secondary | ICD-10-CM | POA: Diagnosis not present

## 2014-10-25 DIAGNOSIS — M1990 Unspecified osteoarthritis, unspecified site: Secondary | ICD-10-CM | POA: Diagnosis not present

## 2014-10-25 DIAGNOSIS — F0280 Dementia in other diseases classified elsewhere without behavioral disturbance: Secondary | ICD-10-CM | POA: Diagnosis not present

## 2014-10-25 DIAGNOSIS — G309 Alzheimer's disease, unspecified: Secondary | ICD-10-CM | POA: Diagnosis not present

## 2014-10-26 DIAGNOSIS — E119 Type 2 diabetes mellitus without complications: Secondary | ICD-10-CM | POA: Diagnosis not present

## 2014-10-26 DIAGNOSIS — G309 Alzheimer's disease, unspecified: Secondary | ICD-10-CM | POA: Diagnosis not present

## 2014-10-26 DIAGNOSIS — F0280 Dementia in other diseases classified elsewhere without behavioral disturbance: Secondary | ICD-10-CM | POA: Diagnosis not present

## 2014-10-26 DIAGNOSIS — M1990 Unspecified osteoarthritis, unspecified site: Secondary | ICD-10-CM | POA: Diagnosis not present

## 2014-10-27 DIAGNOSIS — F0280 Dementia in other diseases classified elsewhere without behavioral disturbance: Secondary | ICD-10-CM | POA: Diagnosis not present

## 2014-10-27 DIAGNOSIS — E119 Type 2 diabetes mellitus without complications: Secondary | ICD-10-CM | POA: Diagnosis not present

## 2014-10-27 DIAGNOSIS — G309 Alzheimer's disease, unspecified: Secondary | ICD-10-CM | POA: Diagnosis not present

## 2014-10-27 DIAGNOSIS — M1990 Unspecified osteoarthritis, unspecified site: Secondary | ICD-10-CM | POA: Diagnosis not present

## 2014-10-28 DIAGNOSIS — E119 Type 2 diabetes mellitus without complications: Secondary | ICD-10-CM | POA: Diagnosis not present

## 2014-10-28 DIAGNOSIS — G309 Alzheimer's disease, unspecified: Secondary | ICD-10-CM | POA: Diagnosis not present

## 2014-10-28 DIAGNOSIS — M1990 Unspecified osteoarthritis, unspecified site: Secondary | ICD-10-CM | POA: Diagnosis not present

## 2014-10-28 DIAGNOSIS — F0280 Dementia in other diseases classified elsewhere without behavioral disturbance: Secondary | ICD-10-CM | POA: Diagnosis not present

## 2014-10-29 DIAGNOSIS — G309 Alzheimer's disease, unspecified: Secondary | ICD-10-CM | POA: Diagnosis not present

## 2014-10-29 DIAGNOSIS — F0280 Dementia in other diseases classified elsewhere without behavioral disturbance: Secondary | ICD-10-CM | POA: Diagnosis not present

## 2014-10-29 DIAGNOSIS — E119 Type 2 diabetes mellitus without complications: Secondary | ICD-10-CM | POA: Diagnosis not present

## 2014-10-29 DIAGNOSIS — M1990 Unspecified osteoarthritis, unspecified site: Secondary | ICD-10-CM | POA: Diagnosis not present

## 2014-10-30 DIAGNOSIS — E119 Type 2 diabetes mellitus without complications: Secondary | ICD-10-CM | POA: Diagnosis not present

## 2014-10-30 DIAGNOSIS — F0280 Dementia in other diseases classified elsewhere without behavioral disturbance: Secondary | ICD-10-CM | POA: Diagnosis not present

## 2014-10-30 DIAGNOSIS — M1990 Unspecified osteoarthritis, unspecified site: Secondary | ICD-10-CM | POA: Diagnosis not present

## 2014-10-30 DIAGNOSIS — G309 Alzheimer's disease, unspecified: Secondary | ICD-10-CM | POA: Diagnosis not present

## 2014-10-31 DIAGNOSIS — E119 Type 2 diabetes mellitus without complications: Secondary | ICD-10-CM | POA: Diagnosis not present

## 2014-10-31 DIAGNOSIS — G309 Alzheimer's disease, unspecified: Secondary | ICD-10-CM | POA: Diagnosis not present

## 2014-10-31 DIAGNOSIS — M1990 Unspecified osteoarthritis, unspecified site: Secondary | ICD-10-CM | POA: Diagnosis not present

## 2014-10-31 DIAGNOSIS — F0280 Dementia in other diseases classified elsewhere without behavioral disturbance: Secondary | ICD-10-CM | POA: Diagnosis not present

## 2014-11-01 DIAGNOSIS — E119 Type 2 diabetes mellitus without complications: Secondary | ICD-10-CM | POA: Diagnosis not present

## 2014-11-01 DIAGNOSIS — M1990 Unspecified osteoarthritis, unspecified site: Secondary | ICD-10-CM | POA: Diagnosis not present

## 2014-11-01 DIAGNOSIS — G309 Alzheimer's disease, unspecified: Secondary | ICD-10-CM | POA: Diagnosis not present

## 2014-11-01 DIAGNOSIS — F0280 Dementia in other diseases classified elsewhere without behavioral disturbance: Secondary | ICD-10-CM | POA: Diagnosis not present

## 2014-11-02 DIAGNOSIS — G309 Alzheimer's disease, unspecified: Secondary | ICD-10-CM | POA: Diagnosis not present

## 2014-11-02 DIAGNOSIS — F0280 Dementia in other diseases classified elsewhere without behavioral disturbance: Secondary | ICD-10-CM | POA: Diagnosis not present

## 2014-11-02 DIAGNOSIS — M1990 Unspecified osteoarthritis, unspecified site: Secondary | ICD-10-CM | POA: Diagnosis not present

## 2014-11-02 DIAGNOSIS — E119 Type 2 diabetes mellitus without complications: Secondary | ICD-10-CM | POA: Diagnosis not present

## 2014-11-03 DIAGNOSIS — E119 Type 2 diabetes mellitus without complications: Secondary | ICD-10-CM | POA: Diagnosis not present

## 2014-11-03 DIAGNOSIS — G309 Alzheimer's disease, unspecified: Secondary | ICD-10-CM | POA: Diagnosis not present

## 2014-11-03 DIAGNOSIS — M1990 Unspecified osteoarthritis, unspecified site: Secondary | ICD-10-CM | POA: Diagnosis not present

## 2014-11-03 DIAGNOSIS — F0280 Dementia in other diseases classified elsewhere without behavioral disturbance: Secondary | ICD-10-CM | POA: Diagnosis not present

## 2014-11-04 DIAGNOSIS — E119 Type 2 diabetes mellitus without complications: Secondary | ICD-10-CM | POA: Diagnosis not present

## 2014-11-04 DIAGNOSIS — F0280 Dementia in other diseases classified elsewhere without behavioral disturbance: Secondary | ICD-10-CM | POA: Diagnosis not present

## 2014-11-04 DIAGNOSIS — M1990 Unspecified osteoarthritis, unspecified site: Secondary | ICD-10-CM | POA: Diagnosis not present

## 2014-11-04 DIAGNOSIS — G309 Alzheimer's disease, unspecified: Secondary | ICD-10-CM | POA: Diagnosis not present

## 2014-11-05 DIAGNOSIS — G309 Alzheimer's disease, unspecified: Secondary | ICD-10-CM | POA: Diagnosis not present

## 2014-11-05 DIAGNOSIS — E119 Type 2 diabetes mellitus without complications: Secondary | ICD-10-CM | POA: Diagnosis not present

## 2014-11-05 DIAGNOSIS — F0280 Dementia in other diseases classified elsewhere without behavioral disturbance: Secondary | ICD-10-CM | POA: Diagnosis not present

## 2014-11-05 DIAGNOSIS — M1990 Unspecified osteoarthritis, unspecified site: Secondary | ICD-10-CM | POA: Diagnosis not present

## 2014-11-06 DIAGNOSIS — E119 Type 2 diabetes mellitus without complications: Secondary | ICD-10-CM | POA: Diagnosis not present

## 2014-11-06 DIAGNOSIS — G309 Alzheimer's disease, unspecified: Secondary | ICD-10-CM | POA: Diagnosis not present

## 2014-11-06 DIAGNOSIS — M1990 Unspecified osteoarthritis, unspecified site: Secondary | ICD-10-CM | POA: Diagnosis not present

## 2014-11-06 DIAGNOSIS — F0280 Dementia in other diseases classified elsewhere without behavioral disturbance: Secondary | ICD-10-CM | POA: Diagnosis not present

## 2014-11-07 DIAGNOSIS — F0280 Dementia in other diseases classified elsewhere without behavioral disturbance: Secondary | ICD-10-CM | POA: Diagnosis not present

## 2014-11-07 DIAGNOSIS — M1990 Unspecified osteoarthritis, unspecified site: Secondary | ICD-10-CM | POA: Diagnosis not present

## 2014-11-07 DIAGNOSIS — G309 Alzheimer's disease, unspecified: Secondary | ICD-10-CM | POA: Diagnosis not present

## 2014-11-07 DIAGNOSIS — E119 Type 2 diabetes mellitus without complications: Secondary | ICD-10-CM | POA: Diagnosis not present

## 2014-11-08 DIAGNOSIS — F0280 Dementia in other diseases classified elsewhere without behavioral disturbance: Secondary | ICD-10-CM | POA: Diagnosis not present

## 2014-11-08 DIAGNOSIS — M1990 Unspecified osteoarthritis, unspecified site: Secondary | ICD-10-CM | POA: Diagnosis not present

## 2014-11-08 DIAGNOSIS — E119 Type 2 diabetes mellitus without complications: Secondary | ICD-10-CM | POA: Diagnosis not present

## 2014-11-08 DIAGNOSIS — G309 Alzheimer's disease, unspecified: Secondary | ICD-10-CM | POA: Diagnosis not present

## 2014-11-09 DIAGNOSIS — M1990 Unspecified osteoarthritis, unspecified site: Secondary | ICD-10-CM | POA: Diagnosis not present

## 2014-11-09 DIAGNOSIS — F0280 Dementia in other diseases classified elsewhere without behavioral disturbance: Secondary | ICD-10-CM | POA: Diagnosis not present

## 2014-11-09 DIAGNOSIS — E119 Type 2 diabetes mellitus without complications: Secondary | ICD-10-CM | POA: Diagnosis not present

## 2014-11-09 DIAGNOSIS — G309 Alzheimer's disease, unspecified: Secondary | ICD-10-CM | POA: Diagnosis not present

## 2014-11-10 DIAGNOSIS — M1990 Unspecified osteoarthritis, unspecified site: Secondary | ICD-10-CM | POA: Diagnosis not present

## 2014-11-10 DIAGNOSIS — E119 Type 2 diabetes mellitus without complications: Secondary | ICD-10-CM | POA: Diagnosis not present

## 2014-11-10 DIAGNOSIS — G309 Alzheimer's disease, unspecified: Secondary | ICD-10-CM | POA: Diagnosis not present

## 2014-11-10 DIAGNOSIS — F0280 Dementia in other diseases classified elsewhere without behavioral disturbance: Secondary | ICD-10-CM | POA: Diagnosis not present

## 2014-11-11 DIAGNOSIS — E119 Type 2 diabetes mellitus without complications: Secondary | ICD-10-CM | POA: Diagnosis not present

## 2014-11-11 DIAGNOSIS — F0280 Dementia in other diseases classified elsewhere without behavioral disturbance: Secondary | ICD-10-CM | POA: Diagnosis not present

## 2014-11-11 DIAGNOSIS — M1990 Unspecified osteoarthritis, unspecified site: Secondary | ICD-10-CM | POA: Diagnosis not present

## 2014-11-11 DIAGNOSIS — G309 Alzheimer's disease, unspecified: Secondary | ICD-10-CM | POA: Diagnosis not present

## 2014-11-12 DIAGNOSIS — E119 Type 2 diabetes mellitus without complications: Secondary | ICD-10-CM | POA: Diagnosis not present

## 2014-11-12 DIAGNOSIS — F0280 Dementia in other diseases classified elsewhere without behavioral disturbance: Secondary | ICD-10-CM | POA: Diagnosis not present

## 2014-11-12 DIAGNOSIS — G309 Alzheimer's disease, unspecified: Secondary | ICD-10-CM | POA: Diagnosis not present

## 2014-11-12 DIAGNOSIS — M1990 Unspecified osteoarthritis, unspecified site: Secondary | ICD-10-CM | POA: Diagnosis not present

## 2014-11-13 DIAGNOSIS — F0280 Dementia in other diseases classified elsewhere without behavioral disturbance: Secondary | ICD-10-CM | POA: Diagnosis not present

## 2014-11-13 DIAGNOSIS — E119 Type 2 diabetes mellitus without complications: Secondary | ICD-10-CM | POA: Diagnosis not present

## 2014-11-13 DIAGNOSIS — M1990 Unspecified osteoarthritis, unspecified site: Secondary | ICD-10-CM | POA: Diagnosis not present

## 2014-11-13 DIAGNOSIS — G309 Alzheimer's disease, unspecified: Secondary | ICD-10-CM | POA: Diagnosis not present

## 2014-11-14 DIAGNOSIS — E119 Type 2 diabetes mellitus without complications: Secondary | ICD-10-CM | POA: Diagnosis not present

## 2014-11-14 DIAGNOSIS — F0280 Dementia in other diseases classified elsewhere without behavioral disturbance: Secondary | ICD-10-CM | POA: Diagnosis not present

## 2014-11-14 DIAGNOSIS — G309 Alzheimer's disease, unspecified: Secondary | ICD-10-CM | POA: Diagnosis not present

## 2014-11-14 DIAGNOSIS — M1990 Unspecified osteoarthritis, unspecified site: Secondary | ICD-10-CM | POA: Diagnosis not present

## 2014-11-16 DIAGNOSIS — F0280 Dementia in other diseases classified elsewhere without behavioral disturbance: Secondary | ICD-10-CM | POA: Diagnosis not present

## 2014-11-16 DIAGNOSIS — M1990 Unspecified osteoarthritis, unspecified site: Secondary | ICD-10-CM | POA: Diagnosis not present

## 2014-11-16 DIAGNOSIS — E119 Type 2 diabetes mellitus without complications: Secondary | ICD-10-CM | POA: Diagnosis not present

## 2014-11-16 DIAGNOSIS — G309 Alzheimer's disease, unspecified: Secondary | ICD-10-CM | POA: Diagnosis not present

## 2014-11-17 DIAGNOSIS — G309 Alzheimer's disease, unspecified: Secondary | ICD-10-CM | POA: Diagnosis not present

## 2014-11-17 DIAGNOSIS — M1990 Unspecified osteoarthritis, unspecified site: Secondary | ICD-10-CM | POA: Diagnosis not present

## 2014-11-17 DIAGNOSIS — E119 Type 2 diabetes mellitus without complications: Secondary | ICD-10-CM | POA: Diagnosis not present

## 2014-11-17 DIAGNOSIS — F0280 Dementia in other diseases classified elsewhere without behavioral disturbance: Secondary | ICD-10-CM | POA: Diagnosis not present

## 2014-11-18 DIAGNOSIS — E119 Type 2 diabetes mellitus without complications: Secondary | ICD-10-CM | POA: Diagnosis not present

## 2014-11-18 DIAGNOSIS — F0280 Dementia in other diseases classified elsewhere without behavioral disturbance: Secondary | ICD-10-CM | POA: Diagnosis not present

## 2014-11-18 DIAGNOSIS — M1990 Unspecified osteoarthritis, unspecified site: Secondary | ICD-10-CM | POA: Diagnosis not present

## 2014-11-18 DIAGNOSIS — G309 Alzheimer's disease, unspecified: Secondary | ICD-10-CM | POA: Diagnosis not present

## 2014-11-19 DIAGNOSIS — E119 Type 2 diabetes mellitus without complications: Secondary | ICD-10-CM | POA: Diagnosis not present

## 2014-11-19 DIAGNOSIS — G309 Alzheimer's disease, unspecified: Secondary | ICD-10-CM | POA: Diagnosis not present

## 2014-11-19 DIAGNOSIS — F0280 Dementia in other diseases classified elsewhere without behavioral disturbance: Secondary | ICD-10-CM | POA: Diagnosis not present

## 2014-11-19 DIAGNOSIS — M1990 Unspecified osteoarthritis, unspecified site: Secondary | ICD-10-CM | POA: Diagnosis not present

## 2014-11-20 DIAGNOSIS — M1990 Unspecified osteoarthritis, unspecified site: Secondary | ICD-10-CM | POA: Diagnosis not present

## 2014-11-20 DIAGNOSIS — F0280 Dementia in other diseases classified elsewhere without behavioral disturbance: Secondary | ICD-10-CM | POA: Diagnosis not present

## 2014-11-20 DIAGNOSIS — G309 Alzheimer's disease, unspecified: Secondary | ICD-10-CM | POA: Diagnosis not present

## 2014-11-20 DIAGNOSIS — E119 Type 2 diabetes mellitus without complications: Secondary | ICD-10-CM | POA: Diagnosis not present

## 2014-11-21 DIAGNOSIS — E119 Type 2 diabetes mellitus without complications: Secondary | ICD-10-CM | POA: Diagnosis not present

## 2014-11-21 DIAGNOSIS — G309 Alzheimer's disease, unspecified: Secondary | ICD-10-CM | POA: Diagnosis not present

## 2014-11-21 DIAGNOSIS — M1990 Unspecified osteoarthritis, unspecified site: Secondary | ICD-10-CM | POA: Diagnosis not present

## 2014-11-21 DIAGNOSIS — F0280 Dementia in other diseases classified elsewhere without behavioral disturbance: Secondary | ICD-10-CM | POA: Diagnosis not present

## 2014-11-22 DIAGNOSIS — F0280 Dementia in other diseases classified elsewhere without behavioral disturbance: Secondary | ICD-10-CM | POA: Diagnosis not present

## 2014-11-22 DIAGNOSIS — E119 Type 2 diabetes mellitus without complications: Secondary | ICD-10-CM | POA: Diagnosis not present

## 2014-11-22 DIAGNOSIS — M1990 Unspecified osteoarthritis, unspecified site: Secondary | ICD-10-CM | POA: Diagnosis not present

## 2014-11-22 DIAGNOSIS — G309 Alzheimer's disease, unspecified: Secondary | ICD-10-CM | POA: Diagnosis not present

## 2014-11-23 DIAGNOSIS — F0280 Dementia in other diseases classified elsewhere without behavioral disturbance: Secondary | ICD-10-CM | POA: Diagnosis not present

## 2014-11-23 DIAGNOSIS — E119 Type 2 diabetes mellitus without complications: Secondary | ICD-10-CM | POA: Diagnosis not present

## 2014-11-23 DIAGNOSIS — G309 Alzheimer's disease, unspecified: Secondary | ICD-10-CM | POA: Diagnosis not present

## 2014-11-23 DIAGNOSIS — M1990 Unspecified osteoarthritis, unspecified site: Secondary | ICD-10-CM | POA: Diagnosis not present

## 2014-11-24 DIAGNOSIS — G309 Alzheimer's disease, unspecified: Secondary | ICD-10-CM | POA: Diagnosis not present

## 2014-11-24 DIAGNOSIS — F0280 Dementia in other diseases classified elsewhere without behavioral disturbance: Secondary | ICD-10-CM | POA: Diagnosis not present

## 2014-11-24 DIAGNOSIS — M1990 Unspecified osteoarthritis, unspecified site: Secondary | ICD-10-CM | POA: Diagnosis not present

## 2014-11-24 DIAGNOSIS — E119 Type 2 diabetes mellitus without complications: Secondary | ICD-10-CM | POA: Diagnosis not present

## 2014-11-25 DIAGNOSIS — F0280 Dementia in other diseases classified elsewhere without behavioral disturbance: Secondary | ICD-10-CM | POA: Diagnosis not present

## 2014-11-25 DIAGNOSIS — G309 Alzheimer's disease, unspecified: Secondary | ICD-10-CM | POA: Diagnosis not present

## 2014-11-25 DIAGNOSIS — E119 Type 2 diabetes mellitus without complications: Secondary | ICD-10-CM | POA: Diagnosis not present

## 2014-11-25 DIAGNOSIS — M1990 Unspecified osteoarthritis, unspecified site: Secondary | ICD-10-CM | POA: Diagnosis not present

## 2014-11-26 DIAGNOSIS — E119 Type 2 diabetes mellitus without complications: Secondary | ICD-10-CM | POA: Diagnosis not present

## 2014-11-26 DIAGNOSIS — M1990 Unspecified osteoarthritis, unspecified site: Secondary | ICD-10-CM | POA: Diagnosis not present

## 2014-11-26 DIAGNOSIS — G309 Alzheimer's disease, unspecified: Secondary | ICD-10-CM | POA: Diagnosis not present

## 2014-11-26 DIAGNOSIS — F0280 Dementia in other diseases classified elsewhere without behavioral disturbance: Secondary | ICD-10-CM | POA: Diagnosis not present

## 2014-11-27 DIAGNOSIS — M1990 Unspecified osteoarthritis, unspecified site: Secondary | ICD-10-CM | POA: Diagnosis not present

## 2014-11-27 DIAGNOSIS — F0280 Dementia in other diseases classified elsewhere without behavioral disturbance: Secondary | ICD-10-CM | POA: Diagnosis not present

## 2014-11-27 DIAGNOSIS — E119 Type 2 diabetes mellitus without complications: Secondary | ICD-10-CM | POA: Diagnosis not present

## 2014-11-27 DIAGNOSIS — G309 Alzheimer's disease, unspecified: Secondary | ICD-10-CM | POA: Diagnosis not present

## 2014-11-28 DIAGNOSIS — E119 Type 2 diabetes mellitus without complications: Secondary | ICD-10-CM | POA: Diagnosis not present

## 2014-11-28 DIAGNOSIS — F0280 Dementia in other diseases classified elsewhere without behavioral disturbance: Secondary | ICD-10-CM | POA: Diagnosis not present

## 2014-11-28 DIAGNOSIS — M1990 Unspecified osteoarthritis, unspecified site: Secondary | ICD-10-CM | POA: Diagnosis not present

## 2014-11-28 DIAGNOSIS — G309 Alzheimer's disease, unspecified: Secondary | ICD-10-CM | POA: Diagnosis not present

## 2014-11-29 DIAGNOSIS — E119 Type 2 diabetes mellitus without complications: Secondary | ICD-10-CM | POA: Diagnosis not present

## 2014-11-29 DIAGNOSIS — M1990 Unspecified osteoarthritis, unspecified site: Secondary | ICD-10-CM | POA: Diagnosis not present

## 2014-11-29 DIAGNOSIS — G309 Alzheimer's disease, unspecified: Secondary | ICD-10-CM | POA: Diagnosis not present

## 2014-11-29 DIAGNOSIS — F0280 Dementia in other diseases classified elsewhere without behavioral disturbance: Secondary | ICD-10-CM | POA: Diagnosis not present

## 2014-11-30 DIAGNOSIS — M1990 Unspecified osteoarthritis, unspecified site: Secondary | ICD-10-CM | POA: Diagnosis not present

## 2014-11-30 DIAGNOSIS — G309 Alzheimer's disease, unspecified: Secondary | ICD-10-CM | POA: Diagnosis not present

## 2014-11-30 DIAGNOSIS — F0280 Dementia in other diseases classified elsewhere without behavioral disturbance: Secondary | ICD-10-CM | POA: Diagnosis not present

## 2014-11-30 DIAGNOSIS — E119 Type 2 diabetes mellitus without complications: Secondary | ICD-10-CM | POA: Diagnosis not present

## 2014-12-01 DIAGNOSIS — M1990 Unspecified osteoarthritis, unspecified site: Secondary | ICD-10-CM | POA: Diagnosis not present

## 2014-12-01 DIAGNOSIS — F0280 Dementia in other diseases classified elsewhere without behavioral disturbance: Secondary | ICD-10-CM | POA: Diagnosis not present

## 2014-12-01 DIAGNOSIS — E119 Type 2 diabetes mellitus without complications: Secondary | ICD-10-CM | POA: Diagnosis not present

## 2014-12-01 DIAGNOSIS — G309 Alzheimer's disease, unspecified: Secondary | ICD-10-CM | POA: Diagnosis not present

## 2014-12-02 DIAGNOSIS — M1990 Unspecified osteoarthritis, unspecified site: Secondary | ICD-10-CM | POA: Diagnosis not present

## 2014-12-02 DIAGNOSIS — F0280 Dementia in other diseases classified elsewhere without behavioral disturbance: Secondary | ICD-10-CM | POA: Diagnosis not present

## 2014-12-02 DIAGNOSIS — G309 Alzheimer's disease, unspecified: Secondary | ICD-10-CM | POA: Diagnosis not present

## 2014-12-02 DIAGNOSIS — E119 Type 2 diabetes mellitus without complications: Secondary | ICD-10-CM | POA: Diagnosis not present

## 2014-12-03 DIAGNOSIS — M1990 Unspecified osteoarthritis, unspecified site: Secondary | ICD-10-CM | POA: Diagnosis not present

## 2014-12-03 DIAGNOSIS — F0280 Dementia in other diseases classified elsewhere without behavioral disturbance: Secondary | ICD-10-CM | POA: Diagnosis not present

## 2014-12-03 DIAGNOSIS — E119 Type 2 diabetes mellitus without complications: Secondary | ICD-10-CM | POA: Diagnosis not present

## 2014-12-03 DIAGNOSIS — G309 Alzheimer's disease, unspecified: Secondary | ICD-10-CM | POA: Diagnosis not present

## 2014-12-04 DIAGNOSIS — F0280 Dementia in other diseases classified elsewhere without behavioral disturbance: Secondary | ICD-10-CM | POA: Diagnosis not present

## 2014-12-04 DIAGNOSIS — M1990 Unspecified osteoarthritis, unspecified site: Secondary | ICD-10-CM | POA: Diagnosis not present

## 2014-12-04 DIAGNOSIS — G309 Alzheimer's disease, unspecified: Secondary | ICD-10-CM | POA: Diagnosis not present

## 2014-12-04 DIAGNOSIS — E119 Type 2 diabetes mellitus without complications: Secondary | ICD-10-CM | POA: Diagnosis not present

## 2014-12-05 DIAGNOSIS — F0280 Dementia in other diseases classified elsewhere without behavioral disturbance: Secondary | ICD-10-CM | POA: Diagnosis not present

## 2014-12-05 DIAGNOSIS — E119 Type 2 diabetes mellitus without complications: Secondary | ICD-10-CM | POA: Diagnosis not present

## 2014-12-05 DIAGNOSIS — G309 Alzheimer's disease, unspecified: Secondary | ICD-10-CM | POA: Diagnosis not present

## 2014-12-05 DIAGNOSIS — M1990 Unspecified osteoarthritis, unspecified site: Secondary | ICD-10-CM | POA: Diagnosis not present

## 2014-12-06 DIAGNOSIS — E119 Type 2 diabetes mellitus without complications: Secondary | ICD-10-CM | POA: Diagnosis not present

## 2014-12-06 DIAGNOSIS — G309 Alzheimer's disease, unspecified: Secondary | ICD-10-CM | POA: Diagnosis not present

## 2014-12-06 DIAGNOSIS — M1990 Unspecified osteoarthritis, unspecified site: Secondary | ICD-10-CM | POA: Diagnosis not present

## 2014-12-06 DIAGNOSIS — F0280 Dementia in other diseases classified elsewhere without behavioral disturbance: Secondary | ICD-10-CM | POA: Diagnosis not present

## 2014-12-07 DIAGNOSIS — F0280 Dementia in other diseases classified elsewhere without behavioral disturbance: Secondary | ICD-10-CM | POA: Diagnosis not present

## 2014-12-07 DIAGNOSIS — M1990 Unspecified osteoarthritis, unspecified site: Secondary | ICD-10-CM | POA: Diagnosis not present

## 2014-12-07 DIAGNOSIS — E119 Type 2 diabetes mellitus without complications: Secondary | ICD-10-CM | POA: Diagnosis not present

## 2014-12-07 DIAGNOSIS — G309 Alzheimer's disease, unspecified: Secondary | ICD-10-CM | POA: Diagnosis not present

## 2014-12-08 DIAGNOSIS — M1990 Unspecified osteoarthritis, unspecified site: Secondary | ICD-10-CM | POA: Diagnosis not present

## 2014-12-08 DIAGNOSIS — E119 Type 2 diabetes mellitus without complications: Secondary | ICD-10-CM | POA: Diagnosis not present

## 2014-12-08 DIAGNOSIS — F0280 Dementia in other diseases classified elsewhere without behavioral disturbance: Secondary | ICD-10-CM | POA: Diagnosis not present

## 2014-12-08 DIAGNOSIS — G309 Alzheimer's disease, unspecified: Secondary | ICD-10-CM | POA: Diagnosis not present

## 2014-12-09 DIAGNOSIS — M1990 Unspecified osteoarthritis, unspecified site: Secondary | ICD-10-CM | POA: Diagnosis not present

## 2014-12-09 DIAGNOSIS — F0280 Dementia in other diseases classified elsewhere without behavioral disturbance: Secondary | ICD-10-CM | POA: Diagnosis not present

## 2014-12-09 DIAGNOSIS — G309 Alzheimer's disease, unspecified: Secondary | ICD-10-CM | POA: Diagnosis not present

## 2014-12-09 DIAGNOSIS — E119 Type 2 diabetes mellitus without complications: Secondary | ICD-10-CM | POA: Diagnosis not present

## 2014-12-10 DIAGNOSIS — M1990 Unspecified osteoarthritis, unspecified site: Secondary | ICD-10-CM | POA: Diagnosis not present

## 2014-12-10 DIAGNOSIS — G309 Alzheimer's disease, unspecified: Secondary | ICD-10-CM | POA: Diagnosis not present

## 2014-12-10 DIAGNOSIS — E119 Type 2 diabetes mellitus without complications: Secondary | ICD-10-CM | POA: Diagnosis not present

## 2014-12-10 DIAGNOSIS — F0280 Dementia in other diseases classified elsewhere without behavioral disturbance: Secondary | ICD-10-CM | POA: Diagnosis not present

## 2014-12-11 DIAGNOSIS — M1990 Unspecified osteoarthritis, unspecified site: Secondary | ICD-10-CM | POA: Diagnosis not present

## 2014-12-11 DIAGNOSIS — F0280 Dementia in other diseases classified elsewhere without behavioral disturbance: Secondary | ICD-10-CM | POA: Diagnosis not present

## 2014-12-11 DIAGNOSIS — E119 Type 2 diabetes mellitus without complications: Secondary | ICD-10-CM | POA: Diagnosis not present

## 2014-12-11 DIAGNOSIS — G309 Alzheimer's disease, unspecified: Secondary | ICD-10-CM | POA: Diagnosis not present

## 2014-12-12 DIAGNOSIS — E119 Type 2 diabetes mellitus without complications: Secondary | ICD-10-CM | POA: Diagnosis not present

## 2014-12-12 DIAGNOSIS — M1990 Unspecified osteoarthritis, unspecified site: Secondary | ICD-10-CM | POA: Diagnosis not present

## 2014-12-12 DIAGNOSIS — F0280 Dementia in other diseases classified elsewhere without behavioral disturbance: Secondary | ICD-10-CM | POA: Diagnosis not present

## 2014-12-12 DIAGNOSIS — G309 Alzheimer's disease, unspecified: Secondary | ICD-10-CM | POA: Diagnosis not present

## 2014-12-13 ENCOUNTER — Telehealth: Payer: Self-pay | Admitting: Family Medicine

## 2014-12-13 DIAGNOSIS — G309 Alzheimer's disease, unspecified: Secondary | ICD-10-CM | POA: Diagnosis not present

## 2014-12-13 DIAGNOSIS — F0280 Dementia in other diseases classified elsewhere without behavioral disturbance: Secondary | ICD-10-CM | POA: Diagnosis not present

## 2014-12-13 DIAGNOSIS — M1990 Unspecified osteoarthritis, unspecified site: Secondary | ICD-10-CM | POA: Diagnosis not present

## 2014-12-13 DIAGNOSIS — E119 Type 2 diabetes mellitus without complications: Secondary | ICD-10-CM | POA: Diagnosis not present

## 2014-12-13 NOTE — Telephone Encounter (Signed)
Received call from [facility] health. States that hospice nurse for this patient wants to discuss the patient. Patient was noted to have a seizure yesterday lasting about 20 seconds with shaking and unresponsiveness. She was lethargic following this. They note that the facility informed them that she may have had another seizure this morning lasting less time. The hospice nurse evaluated her this morning and noted she was back to baseline and was happy and joking and speaking incoherently at her baseline. Notes her blood pressure is in the normal range. The hospice nurse notes she spoke with the patient's son and the patient's son does not want the patient hospitalized for this. She does have a history of seizures in the past per the hospice nurse, though has not had one in about a year per her discussion with the patient's son. The facility was wondering if the patient needed Ativan ordered at this time. Patient's hospice nurse stated she felt like the patient did not need this given the brief nature of the seizures and that the patient was now back to baseline and felt that they could discuss with Dr. Silvio Pate on Monday regarding treatment. It appears she has been on Ativan in the past for agitation, though not seizures. The hospice nurse did state that she is only contacting me because the facility contacted her and it is protocol and she felt as though the patient did not need treatment at this time given her hospice status and they could discuss with the patient's PCP tomorrow. Given the patient is on hospice and is back at her baseline with brief duration of seizures myself and the hospice nurse decided to hold off on an order for Ativan at this time. They will continue to monitor. If she has recurrence of seizures or lengthening duration they will contact me for further discussion of treatment. Will forward to patient's PCP as an FYI.

## 2014-12-14 DIAGNOSIS — E119 Type 2 diabetes mellitus without complications: Secondary | ICD-10-CM | POA: Diagnosis not present

## 2014-12-14 DIAGNOSIS — M1990 Unspecified osteoarthritis, unspecified site: Secondary | ICD-10-CM | POA: Diagnosis not present

## 2014-12-14 DIAGNOSIS — G309 Alzheimer's disease, unspecified: Secondary | ICD-10-CM | POA: Diagnosis not present

## 2014-12-14 DIAGNOSIS — F0280 Dementia in other diseases classified elsewhere without behavioral disturbance: Secondary | ICD-10-CM | POA: Diagnosis not present

## 2014-12-14 NOTE — Telephone Encounter (Signed)
Discussed with Katharine Look RN at triage I agree that treatment is not appropriate at this time. She will pass it on to [facility] --her current primary nurse

## 2014-12-14 NOTE — Telephone Encounter (Signed)
PLEASE NOTE: All timestamps contained within this report are represented as [place] Standard Time. CONFIDENTIALTY NOTICE: This fax transmission is intended only for the addressee. It contains information that is legally privileged, confidential or otherwise protected from use or disclosure. If you are not the intended recipient, you are strictly prohibited from reviewing, disclosing, copying using or disseminating any of this information or taking any action in reliance on or regarding this information. If you have received this fax in error, please notify us immediately by telephone so that we can arrange for its return to [place]. Phone: [phone], Toll-Free: [phone], Fax: [phone] Page: 1 of 1 Call Id: [id number] [place] Patient Name: Tanya Clayton Gender: Female DOB: [birth year] Age: 53 Y 3 M 19 D Return Phone Number: Address: City/State/Zip: [place] Client [place] Night - Client Client Site [place] Physician Viviana Simpler Contact Type Call Call Type Page Only Caller Name Allen Derry w. Hospice Relationship To Patient Self Is this call to report lab results? No Return Phone Number Please choose phone number Initial Comment Caller states patient has had seizure, needs order sent for rx. Allen Derry from hospice. [phone] Nurse Assessment Guidelines Guideline Title Affirmed Question Affirmed Notes Nurse Date/Time Eilene Ghazi Time) Disp. Time Eilene Ghazi Time) Disposition Final User 12/13/2014 9:51:06 AM Send to [facility] Paging Queue Erskin Burnet 12/13/2014 9:52:04 AM Called On-Call Provider Erskin Burnet 12/13/2014 9:52:34 AM Page Completed Yes Erskin Burnet After Care Instructions Given Call Event Type User Date / Time Description Paging DoctorName Phone DateTime Result/Outcome Message Type Notes Tommi Rumps  [id number] 12/13/2014 9:52:04 AM Called On Call Provider - Reached Doctor Paged Tommi Rumps 12/13/2014 9:52:16 AM Spoke with On Call - General Message Result

## 2014-12-15 DIAGNOSIS — M1990 Unspecified osteoarthritis, unspecified site: Secondary | ICD-10-CM | POA: Diagnosis not present

## 2014-12-15 DIAGNOSIS — G309 Alzheimer's disease, unspecified: Secondary | ICD-10-CM | POA: Diagnosis not present

## 2014-12-15 DIAGNOSIS — F0280 Dementia in other diseases classified elsewhere without behavioral disturbance: Secondary | ICD-10-CM | POA: Diagnosis not present

## 2014-12-15 DIAGNOSIS — E119 Type 2 diabetes mellitus without complications: Secondary | ICD-10-CM | POA: Diagnosis not present

## 2014-12-16 DIAGNOSIS — G309 Alzheimer's disease, unspecified: Secondary | ICD-10-CM | POA: Diagnosis not present

## 2014-12-16 DIAGNOSIS — F0280 Dementia in other diseases classified elsewhere without behavioral disturbance: Secondary | ICD-10-CM | POA: Diagnosis not present

## 2014-12-16 DIAGNOSIS — E119 Type 2 diabetes mellitus without complications: Secondary | ICD-10-CM | POA: Diagnosis not present

## 2014-12-16 DIAGNOSIS — M1990 Unspecified osteoarthritis, unspecified site: Secondary | ICD-10-CM | POA: Diagnosis not present

## 2014-12-17 DIAGNOSIS — M1990 Unspecified osteoarthritis, unspecified site: Secondary | ICD-10-CM | POA: Diagnosis not present

## 2014-12-17 DIAGNOSIS — G309 Alzheimer's disease, unspecified: Secondary | ICD-10-CM | POA: Diagnosis not present

## 2014-12-17 DIAGNOSIS — F0280 Dementia in other diseases classified elsewhere without behavioral disturbance: Secondary | ICD-10-CM | POA: Diagnosis not present

## 2014-12-17 DIAGNOSIS — E119 Type 2 diabetes mellitus without complications: Secondary | ICD-10-CM | POA: Diagnosis not present

## 2014-12-18 DIAGNOSIS — E119 Type 2 diabetes mellitus without complications: Secondary | ICD-10-CM | POA: Diagnosis not present

## 2014-12-18 DIAGNOSIS — G309 Alzheimer's disease, unspecified: Secondary | ICD-10-CM | POA: Diagnosis not present

## 2014-12-18 DIAGNOSIS — M1990 Unspecified osteoarthritis, unspecified site: Secondary | ICD-10-CM | POA: Diagnosis not present

## 2014-12-18 DIAGNOSIS — F0280 Dementia in other diseases classified elsewhere without behavioral disturbance: Secondary | ICD-10-CM | POA: Diagnosis not present

## 2014-12-19 DIAGNOSIS — E119 Type 2 diabetes mellitus without complications: Secondary | ICD-10-CM | POA: Diagnosis not present

## 2014-12-19 DIAGNOSIS — F0280 Dementia in other diseases classified elsewhere without behavioral disturbance: Secondary | ICD-10-CM | POA: Diagnosis not present

## 2014-12-19 DIAGNOSIS — G309 Alzheimer's disease, unspecified: Secondary | ICD-10-CM | POA: Diagnosis not present

## 2014-12-19 DIAGNOSIS — M1990 Unspecified osteoarthritis, unspecified site: Secondary | ICD-10-CM | POA: Diagnosis not present

## 2014-12-20 DIAGNOSIS — E119 Type 2 diabetes mellitus without complications: Secondary | ICD-10-CM | POA: Diagnosis not present

## 2014-12-20 DIAGNOSIS — F0280 Dementia in other diseases classified elsewhere without behavioral disturbance: Secondary | ICD-10-CM | POA: Diagnosis not present

## 2014-12-20 DIAGNOSIS — G309 Alzheimer's disease, unspecified: Secondary | ICD-10-CM | POA: Diagnosis not present

## 2014-12-20 DIAGNOSIS — M1990 Unspecified osteoarthritis, unspecified site: Secondary | ICD-10-CM | POA: Diagnosis not present

## 2014-12-21 DIAGNOSIS — G309 Alzheimer's disease, unspecified: Secondary | ICD-10-CM | POA: Diagnosis not present

## 2014-12-21 DIAGNOSIS — E119 Type 2 diabetes mellitus without complications: Secondary | ICD-10-CM | POA: Diagnosis not present

## 2014-12-21 DIAGNOSIS — F0280 Dementia in other diseases classified elsewhere without behavioral disturbance: Secondary | ICD-10-CM | POA: Diagnosis not present

## 2014-12-21 DIAGNOSIS — M1990 Unspecified osteoarthritis, unspecified site: Secondary | ICD-10-CM | POA: Diagnosis not present

## 2014-12-22 DIAGNOSIS — M1990 Unspecified osteoarthritis, unspecified site: Secondary | ICD-10-CM | POA: Diagnosis not present

## 2014-12-22 DIAGNOSIS — F0280 Dementia in other diseases classified elsewhere without behavioral disturbance: Secondary | ICD-10-CM | POA: Diagnosis not present

## 2014-12-22 DIAGNOSIS — G309 Alzheimer's disease, unspecified: Secondary | ICD-10-CM | POA: Diagnosis not present

## 2014-12-22 DIAGNOSIS — E119 Type 2 diabetes mellitus without complications: Secondary | ICD-10-CM | POA: Diagnosis not present

## 2014-12-23 DIAGNOSIS — F0280 Dementia in other diseases classified elsewhere without behavioral disturbance: Secondary | ICD-10-CM | POA: Diagnosis not present

## 2014-12-23 DIAGNOSIS — G309 Alzheimer's disease, unspecified: Secondary | ICD-10-CM | POA: Diagnosis not present

## 2014-12-23 DIAGNOSIS — M1990 Unspecified osteoarthritis, unspecified site: Secondary | ICD-10-CM | POA: Diagnosis not present

## 2014-12-23 DIAGNOSIS — E119 Type 2 diabetes mellitus without complications: Secondary | ICD-10-CM | POA: Diagnosis not present

## 2014-12-24 DIAGNOSIS — G309 Alzheimer's disease, unspecified: Secondary | ICD-10-CM | POA: Diagnosis not present

## 2014-12-24 DIAGNOSIS — E119 Type 2 diabetes mellitus without complications: Secondary | ICD-10-CM | POA: Diagnosis not present

## 2014-12-24 DIAGNOSIS — M1990 Unspecified osteoarthritis, unspecified site: Secondary | ICD-10-CM | POA: Diagnosis not present

## 2014-12-24 DIAGNOSIS — F0280 Dementia in other diseases classified elsewhere without behavioral disturbance: Secondary | ICD-10-CM | POA: Diagnosis not present

## 2014-12-25 DIAGNOSIS — F0280 Dementia in other diseases classified elsewhere without behavioral disturbance: Secondary | ICD-10-CM | POA: Diagnosis not present

## 2014-12-25 DIAGNOSIS — G309 Alzheimer's disease, unspecified: Secondary | ICD-10-CM | POA: Diagnosis not present

## 2014-12-25 DIAGNOSIS — M1990 Unspecified osteoarthritis, unspecified site: Secondary | ICD-10-CM | POA: Diagnosis not present

## 2014-12-25 DIAGNOSIS — E119 Type 2 diabetes mellitus without complications: Secondary | ICD-10-CM | POA: Diagnosis not present

## 2014-12-26 DIAGNOSIS — F0280 Dementia in other diseases classified elsewhere without behavioral disturbance: Secondary | ICD-10-CM | POA: Diagnosis not present

## 2014-12-26 DIAGNOSIS — E119 Type 2 diabetes mellitus without complications: Secondary | ICD-10-CM | POA: Diagnosis not present

## 2014-12-26 DIAGNOSIS — G309 Alzheimer's disease, unspecified: Secondary | ICD-10-CM | POA: Diagnosis not present

## 2014-12-26 DIAGNOSIS — M1990 Unspecified osteoarthritis, unspecified site: Secondary | ICD-10-CM | POA: Diagnosis not present

## 2014-12-28 DIAGNOSIS — G309 Alzheimer's disease, unspecified: Secondary | ICD-10-CM | POA: Diagnosis not present

## 2014-12-28 DIAGNOSIS — F0280 Dementia in other diseases classified elsewhere without behavioral disturbance: Secondary | ICD-10-CM | POA: Diagnosis not present

## 2014-12-28 DIAGNOSIS — E119 Type 2 diabetes mellitus without complications: Secondary | ICD-10-CM | POA: Diagnosis not present

## 2014-12-28 DIAGNOSIS — M1990 Unspecified osteoarthritis, unspecified site: Secondary | ICD-10-CM | POA: Diagnosis not present

## 2014-12-29 DIAGNOSIS — M1990 Unspecified osteoarthritis, unspecified site: Secondary | ICD-10-CM | POA: Diagnosis not present

## 2014-12-29 DIAGNOSIS — G309 Alzheimer's disease, unspecified: Secondary | ICD-10-CM | POA: Diagnosis not present

## 2014-12-29 DIAGNOSIS — F0280 Dementia in other diseases classified elsewhere without behavioral disturbance: Secondary | ICD-10-CM | POA: Diagnosis not present

## 2014-12-29 DIAGNOSIS — E119 Type 2 diabetes mellitus without complications: Secondary | ICD-10-CM | POA: Diagnosis not present

## 2014-12-30 DIAGNOSIS — F0280 Dementia in other diseases classified elsewhere without behavioral disturbance: Secondary | ICD-10-CM | POA: Diagnosis not present

## 2014-12-30 DIAGNOSIS — M1990 Unspecified osteoarthritis, unspecified site: Secondary | ICD-10-CM | POA: Diagnosis not present

## 2014-12-30 DIAGNOSIS — E119 Type 2 diabetes mellitus without complications: Secondary | ICD-10-CM | POA: Diagnosis not present

## 2014-12-30 DIAGNOSIS — G309 Alzheimer's disease, unspecified: Secondary | ICD-10-CM | POA: Diagnosis not present

## 2014-12-31 DIAGNOSIS — G309 Alzheimer's disease, unspecified: Secondary | ICD-10-CM | POA: Diagnosis not present

## 2014-12-31 DIAGNOSIS — F0280 Dementia in other diseases classified elsewhere without behavioral disturbance: Secondary | ICD-10-CM | POA: Diagnosis not present

## 2014-12-31 DIAGNOSIS — E119 Type 2 diabetes mellitus without complications: Secondary | ICD-10-CM | POA: Diagnosis not present

## 2014-12-31 DIAGNOSIS — M1990 Unspecified osteoarthritis, unspecified site: Secondary | ICD-10-CM | POA: Diagnosis not present

## 2015-01-01 DIAGNOSIS — E119 Type 2 diabetes mellitus without complications: Secondary | ICD-10-CM | POA: Diagnosis not present

## 2015-01-01 DIAGNOSIS — F0280 Dementia in other diseases classified elsewhere without behavioral disturbance: Secondary | ICD-10-CM | POA: Diagnosis not present

## 2015-01-01 DIAGNOSIS — G309 Alzheimer's disease, unspecified: Secondary | ICD-10-CM | POA: Diagnosis not present

## 2015-01-01 DIAGNOSIS — M1990 Unspecified osteoarthritis, unspecified site: Secondary | ICD-10-CM | POA: Diagnosis not present

## 2015-01-02 DIAGNOSIS — M1990 Unspecified osteoarthritis, unspecified site: Secondary | ICD-10-CM | POA: Diagnosis not present

## 2015-01-02 DIAGNOSIS — F0280 Dementia in other diseases classified elsewhere without behavioral disturbance: Secondary | ICD-10-CM | POA: Diagnosis not present

## 2015-01-02 DIAGNOSIS — G309 Alzheimer's disease, unspecified: Secondary | ICD-10-CM | POA: Diagnosis not present

## 2015-01-02 DIAGNOSIS — E119 Type 2 diabetes mellitus without complications: Secondary | ICD-10-CM | POA: Diagnosis not present

## 2015-01-03 DIAGNOSIS — G309 Alzheimer's disease, unspecified: Secondary | ICD-10-CM | POA: Diagnosis not present

## 2015-01-03 DIAGNOSIS — E119 Type 2 diabetes mellitus without complications: Secondary | ICD-10-CM | POA: Diagnosis not present

## 2015-01-03 DIAGNOSIS — F0280 Dementia in other diseases classified elsewhere without behavioral disturbance: Secondary | ICD-10-CM | POA: Diagnosis not present

## 2015-01-03 DIAGNOSIS — M1990 Unspecified osteoarthritis, unspecified site: Secondary | ICD-10-CM | POA: Diagnosis not present

## 2015-01-04 DIAGNOSIS — G309 Alzheimer's disease, unspecified: Secondary | ICD-10-CM | POA: Diagnosis not present

## 2015-01-04 DIAGNOSIS — M1990 Unspecified osteoarthritis, unspecified site: Secondary | ICD-10-CM | POA: Diagnosis not present

## 2015-01-04 DIAGNOSIS — F0280 Dementia in other diseases classified elsewhere without behavioral disturbance: Secondary | ICD-10-CM | POA: Diagnosis not present

## 2015-01-04 DIAGNOSIS — E119 Type 2 diabetes mellitus without complications: Secondary | ICD-10-CM | POA: Diagnosis not present

## 2015-01-05 DIAGNOSIS — F0280 Dementia in other diseases classified elsewhere without behavioral disturbance: Secondary | ICD-10-CM | POA: Diagnosis not present

## 2015-01-05 DIAGNOSIS — M1990 Unspecified osteoarthritis, unspecified site: Secondary | ICD-10-CM | POA: Diagnosis not present

## 2015-01-05 DIAGNOSIS — E119 Type 2 diabetes mellitus without complications: Secondary | ICD-10-CM | POA: Diagnosis not present

## 2015-01-05 DIAGNOSIS — G309 Alzheimer's disease, unspecified: Secondary | ICD-10-CM | POA: Diagnosis not present

## 2015-01-06 ENCOUNTER — Other Ambulatory Visit: Payer: Self-pay | Admitting: Internal Medicine

## 2015-01-06 DIAGNOSIS — M1990 Unspecified osteoarthritis, unspecified site: Secondary | ICD-10-CM | POA: Diagnosis not present

## 2015-01-06 DIAGNOSIS — G309 Alzheimer's disease, unspecified: Secondary | ICD-10-CM | POA: Diagnosis not present

## 2015-01-06 DIAGNOSIS — E119 Type 2 diabetes mellitus without complications: Secondary | ICD-10-CM | POA: Diagnosis not present

## 2015-01-06 DIAGNOSIS — F0280 Dementia in other diseases classified elsewhere without behavioral disturbance: Secondary | ICD-10-CM | POA: Diagnosis not present

## 2015-01-07 DIAGNOSIS — F0280 Dementia in other diseases classified elsewhere without behavioral disturbance: Secondary | ICD-10-CM | POA: Diagnosis not present

## 2015-01-07 DIAGNOSIS — M1990 Unspecified osteoarthritis, unspecified site: Secondary | ICD-10-CM | POA: Diagnosis not present

## 2015-01-07 DIAGNOSIS — G309 Alzheimer's disease, unspecified: Secondary | ICD-10-CM | POA: Diagnosis not present

## 2015-01-07 DIAGNOSIS — E119 Type 2 diabetes mellitus without complications: Secondary | ICD-10-CM | POA: Diagnosis not present

## 2015-01-08 DIAGNOSIS — E119 Type 2 diabetes mellitus without complications: Secondary | ICD-10-CM | POA: Diagnosis not present

## 2015-01-08 DIAGNOSIS — G309 Alzheimer's disease, unspecified: Secondary | ICD-10-CM | POA: Diagnosis not present

## 2015-01-08 DIAGNOSIS — M1990 Unspecified osteoarthritis, unspecified site: Secondary | ICD-10-CM | POA: Diagnosis not present

## 2015-01-08 DIAGNOSIS — F0280 Dementia in other diseases classified elsewhere without behavioral disturbance: Secondary | ICD-10-CM | POA: Diagnosis not present

## 2015-01-09 DIAGNOSIS — G309 Alzheimer's disease, unspecified: Secondary | ICD-10-CM | POA: Diagnosis not present

## 2015-01-09 DIAGNOSIS — M1990 Unspecified osteoarthritis, unspecified site: Secondary | ICD-10-CM | POA: Diagnosis not present

## 2015-01-09 DIAGNOSIS — F0280 Dementia in other diseases classified elsewhere without behavioral disturbance: Secondary | ICD-10-CM | POA: Diagnosis not present

## 2015-01-09 DIAGNOSIS — E119 Type 2 diabetes mellitus without complications: Secondary | ICD-10-CM | POA: Diagnosis not present

## 2015-01-10 DIAGNOSIS — E119 Type 2 diabetes mellitus without complications: Secondary | ICD-10-CM | POA: Diagnosis not present

## 2015-01-10 DIAGNOSIS — G309 Alzheimer's disease, unspecified: Secondary | ICD-10-CM | POA: Diagnosis not present

## 2015-01-10 DIAGNOSIS — F0280 Dementia in other diseases classified elsewhere without behavioral disturbance: Secondary | ICD-10-CM | POA: Diagnosis not present

## 2015-01-10 DIAGNOSIS — M1990 Unspecified osteoarthritis, unspecified site: Secondary | ICD-10-CM | POA: Diagnosis not present

## 2015-01-11 DIAGNOSIS — E119 Type 2 diabetes mellitus without complications: Secondary | ICD-10-CM | POA: Diagnosis not present

## 2015-01-11 DIAGNOSIS — F0280 Dementia in other diseases classified elsewhere without behavioral disturbance: Secondary | ICD-10-CM | POA: Diagnosis not present

## 2015-01-11 DIAGNOSIS — G309 Alzheimer's disease, unspecified: Secondary | ICD-10-CM | POA: Diagnosis not present

## 2015-01-11 DIAGNOSIS — M1990 Unspecified osteoarthritis, unspecified site: Secondary | ICD-10-CM | POA: Diagnosis not present

## 2015-01-12 DIAGNOSIS — G309 Alzheimer's disease, unspecified: Secondary | ICD-10-CM | POA: Diagnosis not present

## 2015-01-12 DIAGNOSIS — M1990 Unspecified osteoarthritis, unspecified site: Secondary | ICD-10-CM | POA: Diagnosis not present

## 2015-01-12 DIAGNOSIS — F0280 Dementia in other diseases classified elsewhere without behavioral disturbance: Secondary | ICD-10-CM | POA: Diagnosis not present

## 2015-01-12 DIAGNOSIS — E119 Type 2 diabetes mellitus without complications: Secondary | ICD-10-CM | POA: Diagnosis not present

## 2015-01-13 DIAGNOSIS — M1990 Unspecified osteoarthritis, unspecified site: Secondary | ICD-10-CM | POA: Diagnosis not present

## 2015-01-13 DIAGNOSIS — G309 Alzheimer's disease, unspecified: Secondary | ICD-10-CM | POA: Diagnosis not present

## 2015-01-13 DIAGNOSIS — F0280 Dementia in other diseases classified elsewhere without behavioral disturbance: Secondary | ICD-10-CM | POA: Diagnosis not present

## 2015-01-13 DIAGNOSIS — E119 Type 2 diabetes mellitus without complications: Secondary | ICD-10-CM | POA: Diagnosis not present

## 2015-01-14 DIAGNOSIS — F0280 Dementia in other diseases classified elsewhere without behavioral disturbance: Secondary | ICD-10-CM | POA: Diagnosis not present

## 2015-01-14 DIAGNOSIS — G309 Alzheimer's disease, unspecified: Secondary | ICD-10-CM | POA: Diagnosis not present

## 2015-01-14 DIAGNOSIS — E119 Type 2 diabetes mellitus without complications: Secondary | ICD-10-CM | POA: Diagnosis not present

## 2015-01-14 DIAGNOSIS — M1990 Unspecified osteoarthritis, unspecified site: Secondary | ICD-10-CM | POA: Diagnosis not present

## 2015-01-15 DIAGNOSIS — G309 Alzheimer's disease, unspecified: Secondary | ICD-10-CM | POA: Diagnosis not present

## 2015-01-15 DIAGNOSIS — E119 Type 2 diabetes mellitus without complications: Secondary | ICD-10-CM | POA: Diagnosis not present

## 2015-01-15 DIAGNOSIS — M1990 Unspecified osteoarthritis, unspecified site: Secondary | ICD-10-CM | POA: Diagnosis not present

## 2015-01-15 DIAGNOSIS — F0280 Dementia in other diseases classified elsewhere without behavioral disturbance: Secondary | ICD-10-CM | POA: Diagnosis not present

## 2015-01-16 DIAGNOSIS — E119 Type 2 diabetes mellitus without complications: Secondary | ICD-10-CM | POA: Diagnosis not present

## 2015-01-16 DIAGNOSIS — G309 Alzheimer's disease, unspecified: Secondary | ICD-10-CM | POA: Diagnosis not present

## 2015-01-16 DIAGNOSIS — F0280 Dementia in other diseases classified elsewhere without behavioral disturbance: Secondary | ICD-10-CM | POA: Diagnosis not present

## 2015-01-16 DIAGNOSIS — M1990 Unspecified osteoarthritis, unspecified site: Secondary | ICD-10-CM | POA: Diagnosis not present

## 2015-01-17 DIAGNOSIS — M1990 Unspecified osteoarthritis, unspecified site: Secondary | ICD-10-CM | POA: Diagnosis not present

## 2015-01-17 DIAGNOSIS — G309 Alzheimer's disease, unspecified: Secondary | ICD-10-CM | POA: Diagnosis not present

## 2015-01-17 DIAGNOSIS — E119 Type 2 diabetes mellitus without complications: Secondary | ICD-10-CM | POA: Diagnosis not present

## 2015-01-17 DIAGNOSIS — F0280 Dementia in other diseases classified elsewhere without behavioral disturbance: Secondary | ICD-10-CM | POA: Diagnosis not present

## 2015-01-18 DIAGNOSIS — F0280 Dementia in other diseases classified elsewhere without behavioral disturbance: Secondary | ICD-10-CM | POA: Diagnosis not present

## 2015-01-18 DIAGNOSIS — G309 Alzheimer's disease, unspecified: Secondary | ICD-10-CM | POA: Diagnosis not present

## 2015-01-18 DIAGNOSIS — M1990 Unspecified osteoarthritis, unspecified site: Secondary | ICD-10-CM | POA: Diagnosis not present

## 2015-01-18 DIAGNOSIS — E119 Type 2 diabetes mellitus without complications: Secondary | ICD-10-CM | POA: Diagnosis not present

## 2015-01-19 DIAGNOSIS — E119 Type 2 diabetes mellitus without complications: Secondary | ICD-10-CM | POA: Diagnosis not present

## 2015-01-19 DIAGNOSIS — M1990 Unspecified osteoarthritis, unspecified site: Secondary | ICD-10-CM | POA: Diagnosis not present

## 2015-01-19 DIAGNOSIS — F0280 Dementia in other diseases classified elsewhere without behavioral disturbance: Secondary | ICD-10-CM | POA: Diagnosis not present

## 2015-01-19 DIAGNOSIS — G309 Alzheimer's disease, unspecified: Secondary | ICD-10-CM | POA: Diagnosis not present

## 2015-01-20 DIAGNOSIS — M1990 Unspecified osteoarthritis, unspecified site: Secondary | ICD-10-CM | POA: Diagnosis not present

## 2015-01-20 DIAGNOSIS — E119 Type 2 diabetes mellitus without complications: Secondary | ICD-10-CM | POA: Diagnosis not present

## 2015-01-20 DIAGNOSIS — F0280 Dementia in other diseases classified elsewhere without behavioral disturbance: Secondary | ICD-10-CM | POA: Diagnosis not present

## 2015-01-20 DIAGNOSIS — G309 Alzheimer's disease, unspecified: Secondary | ICD-10-CM | POA: Diagnosis not present

## 2015-01-21 DIAGNOSIS — G309 Alzheimer's disease, unspecified: Secondary | ICD-10-CM | POA: Diagnosis not present

## 2015-01-21 DIAGNOSIS — F0280 Dementia in other diseases classified elsewhere without behavioral disturbance: Secondary | ICD-10-CM | POA: Diagnosis not present

## 2015-01-21 DIAGNOSIS — E119 Type 2 diabetes mellitus without complications: Secondary | ICD-10-CM | POA: Diagnosis not present

## 2015-01-21 DIAGNOSIS — M1990 Unspecified osteoarthritis, unspecified site: Secondary | ICD-10-CM | POA: Diagnosis not present

## 2015-01-22 DIAGNOSIS — E119 Type 2 diabetes mellitus without complications: Secondary | ICD-10-CM | POA: Diagnosis not present

## 2015-01-22 DIAGNOSIS — F0280 Dementia in other diseases classified elsewhere without behavioral disturbance: Secondary | ICD-10-CM | POA: Diagnosis not present

## 2015-01-22 DIAGNOSIS — M1990 Unspecified osteoarthritis, unspecified site: Secondary | ICD-10-CM | POA: Diagnosis not present

## 2015-01-22 DIAGNOSIS — G309 Alzheimer's disease, unspecified: Secondary | ICD-10-CM | POA: Diagnosis not present

## 2015-01-23 DIAGNOSIS — M1990 Unspecified osteoarthritis, unspecified site: Secondary | ICD-10-CM | POA: Diagnosis not present

## 2015-01-23 DIAGNOSIS — E119 Type 2 diabetes mellitus without complications: Secondary | ICD-10-CM | POA: Diagnosis not present

## 2015-01-23 DIAGNOSIS — F0280 Dementia in other diseases classified elsewhere without behavioral disturbance: Secondary | ICD-10-CM | POA: Diagnosis not present

## 2015-01-23 DIAGNOSIS — G309 Alzheimer's disease, unspecified: Secondary | ICD-10-CM | POA: Diagnosis not present

## 2015-01-24 DIAGNOSIS — F0280 Dementia in other diseases classified elsewhere without behavioral disturbance: Secondary | ICD-10-CM | POA: Diagnosis not present

## 2015-01-24 DIAGNOSIS — G309 Alzheimer's disease, unspecified: Secondary | ICD-10-CM | POA: Diagnosis not present

## 2015-01-24 DIAGNOSIS — M1990 Unspecified osteoarthritis, unspecified site: Secondary | ICD-10-CM | POA: Diagnosis not present

## 2015-01-24 DIAGNOSIS — E119 Type 2 diabetes mellitus without complications: Secondary | ICD-10-CM | POA: Diagnosis not present

## 2015-01-25 DIAGNOSIS — G309 Alzheimer's disease, unspecified: Secondary | ICD-10-CM | POA: Diagnosis not present

## 2015-01-25 DIAGNOSIS — M1990 Unspecified osteoarthritis, unspecified site: Secondary | ICD-10-CM | POA: Diagnosis not present

## 2015-01-25 DIAGNOSIS — F0280 Dementia in other diseases classified elsewhere without behavioral disturbance: Secondary | ICD-10-CM | POA: Diagnosis not present

## 2015-01-25 DIAGNOSIS — E119 Type 2 diabetes mellitus without complications: Secondary | ICD-10-CM | POA: Diagnosis not present

## 2015-01-26 DIAGNOSIS — F0280 Dementia in other diseases classified elsewhere without behavioral disturbance: Secondary | ICD-10-CM | POA: Diagnosis not present

## 2015-01-26 DIAGNOSIS — G309 Alzheimer's disease, unspecified: Secondary | ICD-10-CM | POA: Diagnosis not present

## 2015-01-26 DIAGNOSIS — E119 Type 2 diabetes mellitus without complications: Secondary | ICD-10-CM | POA: Diagnosis not present

## 2015-01-26 DIAGNOSIS — M1990 Unspecified osteoarthritis, unspecified site: Secondary | ICD-10-CM | POA: Diagnosis not present

## 2015-01-27 DIAGNOSIS — F0280 Dementia in other diseases classified elsewhere without behavioral disturbance: Secondary | ICD-10-CM | POA: Diagnosis not present

## 2015-01-27 DIAGNOSIS — E119 Type 2 diabetes mellitus without complications: Secondary | ICD-10-CM | POA: Diagnosis not present

## 2015-01-27 DIAGNOSIS — G309 Alzheimer's disease, unspecified: Secondary | ICD-10-CM | POA: Diagnosis not present

## 2015-01-27 DIAGNOSIS — M1990 Unspecified osteoarthritis, unspecified site: Secondary | ICD-10-CM | POA: Diagnosis not present

## 2015-01-28 DIAGNOSIS — E119 Type 2 diabetes mellitus without complications: Secondary | ICD-10-CM | POA: Diagnosis not present

## 2015-01-28 DIAGNOSIS — M1990 Unspecified osteoarthritis, unspecified site: Secondary | ICD-10-CM | POA: Diagnosis not present

## 2015-01-28 DIAGNOSIS — G309 Alzheimer's disease, unspecified: Secondary | ICD-10-CM | POA: Diagnosis not present

## 2015-01-28 DIAGNOSIS — F0280 Dementia in other diseases classified elsewhere without behavioral disturbance: Secondary | ICD-10-CM | POA: Diagnosis not present

## 2015-01-29 DIAGNOSIS — E119 Type 2 diabetes mellitus without complications: Secondary | ICD-10-CM | POA: Diagnosis not present

## 2015-01-29 DIAGNOSIS — M1990 Unspecified osteoarthritis, unspecified site: Secondary | ICD-10-CM | POA: Diagnosis not present

## 2015-01-29 DIAGNOSIS — G309 Alzheimer's disease, unspecified: Secondary | ICD-10-CM | POA: Diagnosis not present

## 2015-01-29 DIAGNOSIS — F0280 Dementia in other diseases classified elsewhere without behavioral disturbance: Secondary | ICD-10-CM | POA: Diagnosis not present

## 2015-01-30 DIAGNOSIS — E119 Type 2 diabetes mellitus without complications: Secondary | ICD-10-CM | POA: Diagnosis not present

## 2015-01-30 DIAGNOSIS — G309 Alzheimer's disease, unspecified: Secondary | ICD-10-CM | POA: Diagnosis not present

## 2015-01-30 DIAGNOSIS — F0280 Dementia in other diseases classified elsewhere without behavioral disturbance: Secondary | ICD-10-CM | POA: Diagnosis not present

## 2015-01-30 DIAGNOSIS — M1990 Unspecified osteoarthritis, unspecified site: Secondary | ICD-10-CM | POA: Diagnosis not present

## 2015-01-31 DIAGNOSIS — G309 Alzheimer's disease, unspecified: Secondary | ICD-10-CM | POA: Diagnosis not present

## 2015-01-31 DIAGNOSIS — M1990 Unspecified osteoarthritis, unspecified site: Secondary | ICD-10-CM | POA: Diagnosis not present

## 2015-01-31 DIAGNOSIS — F0280 Dementia in other diseases classified elsewhere without behavioral disturbance: Secondary | ICD-10-CM | POA: Diagnosis not present

## 2015-01-31 DIAGNOSIS — E119 Type 2 diabetes mellitus without complications: Secondary | ICD-10-CM | POA: Diagnosis not present

## 2015-02-01 DIAGNOSIS — G309 Alzheimer's disease, unspecified: Secondary | ICD-10-CM | POA: Diagnosis not present

## 2015-02-01 DIAGNOSIS — E119 Type 2 diabetes mellitus without complications: Secondary | ICD-10-CM | POA: Diagnosis not present

## 2015-02-01 DIAGNOSIS — M1990 Unspecified osteoarthritis, unspecified site: Secondary | ICD-10-CM | POA: Diagnosis not present

## 2015-02-01 DIAGNOSIS — F0280 Dementia in other diseases classified elsewhere without behavioral disturbance: Secondary | ICD-10-CM | POA: Diagnosis not present

## 2015-02-02 DIAGNOSIS — F0280 Dementia in other diseases classified elsewhere without behavioral disturbance: Secondary | ICD-10-CM | POA: Diagnosis not present

## 2015-02-02 DIAGNOSIS — E119 Type 2 diabetes mellitus without complications: Secondary | ICD-10-CM | POA: Diagnosis not present

## 2015-02-02 DIAGNOSIS — G309 Alzheimer's disease, unspecified: Secondary | ICD-10-CM | POA: Diagnosis not present

## 2015-02-02 DIAGNOSIS — M1990 Unspecified osteoarthritis, unspecified site: Secondary | ICD-10-CM | POA: Diagnosis not present

## 2015-02-03 DIAGNOSIS — E119 Type 2 diabetes mellitus without complications: Secondary | ICD-10-CM | POA: Diagnosis not present

## 2015-02-03 DIAGNOSIS — M1990 Unspecified osteoarthritis, unspecified site: Secondary | ICD-10-CM | POA: Diagnosis not present

## 2015-02-03 DIAGNOSIS — G309 Alzheimer's disease, unspecified: Secondary | ICD-10-CM | POA: Diagnosis not present

## 2015-02-03 DIAGNOSIS — F0280 Dementia in other diseases classified elsewhere without behavioral disturbance: Secondary | ICD-10-CM | POA: Diagnosis not present

## 2015-02-04 ENCOUNTER — Non-Acute Institutional Stay: Payer: Medicare Other | Admitting: Internal Medicine

## 2015-02-04 ENCOUNTER — Encounter: Payer: Self-pay | Admitting: Internal Medicine

## 2015-02-04 VITALS — BP 148/88 | HR 72 | Temp 98.3°F | Resp 18

## 2015-02-04 DIAGNOSIS — G301 Alzheimer's disease with late onset: Secondary | ICD-10-CM | POA: Diagnosis not present

## 2015-02-04 DIAGNOSIS — F0280 Dementia in other diseases classified elsewhere without behavioral disturbance: Secondary | ICD-10-CM

## 2015-02-04 DIAGNOSIS — E43 Unspecified severe protein-calorie malnutrition: Secondary | ICD-10-CM

## 2015-02-04 DIAGNOSIS — M170 Bilateral primary osteoarthritis of knee: Secondary | ICD-10-CM | POA: Diagnosis not present

## 2015-02-04 DIAGNOSIS — E119 Type 2 diabetes mellitus without complications: Secondary | ICD-10-CM | POA: Diagnosis not present

## 2015-02-04 DIAGNOSIS — M1990 Unspecified osteoarthritis, unspecified site: Secondary | ICD-10-CM | POA: Diagnosis not present

## 2015-02-04 DIAGNOSIS — G309 Alzheimer's disease, unspecified: Secondary | ICD-10-CM | POA: Diagnosis not present

## 2015-02-04 NOTE — Assessment & Plan Note (Signed)
Sugars okay Would stop metformin when her oral intake falls off--which is inevitable

## 2015-02-04 NOTE — Progress Notes (Signed)
Subjective:    Patient ID: Tanya Clayton, female    DOB: 06/11/1935, 80 y.o.   MRN: [id number]  HPI Visit for follow up of dementia and other chronic medical conditions Reviewed status with [facility] director Son is here  Dementia is severe Now totally bed bound--very agitated if they try to get her out Maintained here in [place] with hospice help Total care including having to be fed  No obvious pain unless they try to move her legs Gets analgesics prn in addition to fentanyl patch  Sugars checked weekly Appetite is still okay--- but continues to lose weight No hypoglycemia  Hasn't been agitated--unless in leg pain Can't tell if hallucinating anymore--- due to decline Hasn't known son in a long time  No apparent dyspnea or chest pain  Current Outpatient Prescriptions on File Prior to Visit  Medication Sig Dispense Refill  . Alcohol Swabs (B-D SINGLE USE SWABS REGULAR) PADS FINGERSTICK BLOOD SUGAR TEST ONCE DAILY. 100 each 11  . bisacodyl (DULCOLAX) 10 MG suppository Place 10 mg rectally every 3 (three) days as needed for constipation.    Marland Kitchen glucose blood test strip True Track test strips; pt test blood sugar twice daily and as directed.250.00 100 each 3  . ibuprofen (ADVIL,MOTRIN) 400 MG tablet Take 400 mg by mouth 3 (three) times daily.    . Lancets Misc. (UNISTIK 3 COMFORT) MISC Fingerstick blood sugar test once daily 100 each 1  . metFORMIN (GLUCOPHAGE) 500 MG tablet Take 500 mg by mouth daily with breakfast.    . MONOJECT INS SYR .5CC/30G 30G X 5/16" 0.5 ML MISC USE AS DIRECTED 100 each PRN  . polyethylene glycol (MIRALAX / GLYCOLAX) packet Take 17 g by mouth daily.    . Skin Protectants, Misc. (ENDIT EX) Apply topically 4 (four) times daily as needed (until open area resolves).    . traMADol (ULTRAM) 50 MG tablet Take 50 mg by mouth every 4 (four) hours as needed.    . triamcinolone lotion (KENALOG) 0.1 % Apply 1 application topically 3 (three) times daily as needed.      No current facility-administered medications on file prior to visit.    No Known Allergies  Past Medical History  Diagnosis Date  . Alzheimer's disease 2006  . Breast cancer ([place])     Dr Grayland Ormond  . Allergy   . Arthritis   . Diabetes mellitus   . Sacral decubitus ulcer, stage II 02/2013    Past Surgical History  Procedure Laterality Date  . Mastectomy  2007    Right--with implant  . Abdominal hysterectomy  1960's    Family History  Problem Relation Age of Onset  . Diabetes Mother     Social History   Social History  . Marital Status: Widowed    Spouse Name: N/A  . Number of Children: 2  . Years of Education: N/A   Occupational History  . Schoolteacher     retired   Social History Main Topics  . Smoking status: Never Smoker   . Smokeless tobacco: Never Used  . Alcohol Use: No  . Drug Use: Not on file  . Sexual Activity: Not on file   Other Topics Concern  . Not on file   Social History Narrative   1 son, 1 step daughter   Has DNR--done 10/10/12         Review of Systems No skin ulcers Sleeps okay---much of the day also Bowels are okay    Objective:   Physical  Exam  Constitutional: No distress.  Alert-- same smile as always Emaciated  Neck: No thyromegaly present.  Cardiovascular: Normal rate, regular rhythm and normal heart sounds.  Exam reveals no gallop.   No murmur heard. Pulmonary/Chest: Effort normal and breath sounds normal. No respiratory distress. She has no wheezes. She has no rales.  Abdominal: Soft. There is no tenderness.  Musculoskeletal: She exhibits no edema or tenderness.  Lymphadenopathy:    She has no cervical adenopathy.  Psychiatric:  No pain now--seems satisfied          Assessment & Plan:

## 2015-02-04 NOTE — Assessment & Plan Note (Signed)
I expect she could have pain all over---but she seems to have reasonable control with the fentanyl

## 2015-02-04 NOTE — Assessment & Plan Note (Signed)
End stage but holding on Still eating though progressive weight loss Continues in AL with help of hospice May benefit from hospice home if she stops eating

## 2015-02-04 NOTE — Assessment & Plan Note (Signed)
Ongoing weight loss despite eating

## 2015-02-05 DIAGNOSIS — G309 Alzheimer's disease, unspecified: Secondary | ICD-10-CM | POA: Diagnosis not present

## 2015-02-05 DIAGNOSIS — F0280 Dementia in other diseases classified elsewhere without behavioral disturbance: Secondary | ICD-10-CM | POA: Diagnosis not present

## 2015-02-05 DIAGNOSIS — E119 Type 2 diabetes mellitus without complications: Secondary | ICD-10-CM | POA: Diagnosis not present

## 2015-02-05 DIAGNOSIS — M1990 Unspecified osteoarthritis, unspecified site: Secondary | ICD-10-CM | POA: Diagnosis not present

## 2015-02-06 DIAGNOSIS — M1990 Unspecified osteoarthritis, unspecified site: Secondary | ICD-10-CM | POA: Diagnosis not present

## 2015-02-06 DIAGNOSIS — E119 Type 2 diabetes mellitus without complications: Secondary | ICD-10-CM | POA: Diagnosis not present

## 2015-02-06 DIAGNOSIS — F0280 Dementia in other diseases classified elsewhere without behavioral disturbance: Secondary | ICD-10-CM | POA: Diagnosis not present

## 2015-02-06 DIAGNOSIS — G309 Alzheimer's disease, unspecified: Secondary | ICD-10-CM | POA: Diagnosis not present

## 2015-02-07 DIAGNOSIS — F0280 Dementia in other diseases classified elsewhere without behavioral disturbance: Secondary | ICD-10-CM | POA: Diagnosis not present

## 2015-02-07 DIAGNOSIS — M1990 Unspecified osteoarthritis, unspecified site: Secondary | ICD-10-CM | POA: Diagnosis not present

## 2015-02-07 DIAGNOSIS — G309 Alzheimer's disease, unspecified: Secondary | ICD-10-CM | POA: Diagnosis not present

## 2015-02-07 DIAGNOSIS — E119 Type 2 diabetes mellitus without complications: Secondary | ICD-10-CM | POA: Diagnosis not present

## 2015-02-08 DIAGNOSIS — G309 Alzheimer's disease, unspecified: Secondary | ICD-10-CM | POA: Diagnosis not present

## 2015-02-08 DIAGNOSIS — F0280 Dementia in other diseases classified elsewhere without behavioral disturbance: Secondary | ICD-10-CM | POA: Diagnosis not present

## 2015-02-08 DIAGNOSIS — M1990 Unspecified osteoarthritis, unspecified site: Secondary | ICD-10-CM | POA: Diagnosis not present

## 2015-02-08 DIAGNOSIS — E119 Type 2 diabetes mellitus without complications: Secondary | ICD-10-CM | POA: Diagnosis not present

## 2015-02-09 DIAGNOSIS — G309 Alzheimer's disease, unspecified: Secondary | ICD-10-CM | POA: Diagnosis not present

## 2015-02-09 DIAGNOSIS — M1990 Unspecified osteoarthritis, unspecified site: Secondary | ICD-10-CM | POA: Diagnosis not present

## 2015-02-09 DIAGNOSIS — E119 Type 2 diabetes mellitus without complications: Secondary | ICD-10-CM | POA: Diagnosis not present

## 2015-02-09 DIAGNOSIS — F0280 Dementia in other diseases classified elsewhere without behavioral disturbance: Secondary | ICD-10-CM | POA: Diagnosis not present

## 2015-02-10 DIAGNOSIS — G309 Alzheimer's disease, unspecified: Secondary | ICD-10-CM | POA: Diagnosis not present

## 2015-02-10 DIAGNOSIS — M1990 Unspecified osteoarthritis, unspecified site: Secondary | ICD-10-CM | POA: Diagnosis not present

## 2015-02-10 DIAGNOSIS — E119 Type 2 diabetes mellitus without complications: Secondary | ICD-10-CM | POA: Diagnosis not present

## 2015-02-10 DIAGNOSIS — F0280 Dementia in other diseases classified elsewhere without behavioral disturbance: Secondary | ICD-10-CM | POA: Diagnosis not present

## 2015-02-11 DIAGNOSIS — G309 Alzheimer's disease, unspecified: Secondary | ICD-10-CM | POA: Diagnosis not present

## 2015-02-11 DIAGNOSIS — M1990 Unspecified osteoarthritis, unspecified site: Secondary | ICD-10-CM | POA: Diagnosis not present

## 2015-02-11 DIAGNOSIS — E119 Type 2 diabetes mellitus without complications: Secondary | ICD-10-CM | POA: Diagnosis not present

## 2015-02-11 DIAGNOSIS — F0280 Dementia in other diseases classified elsewhere without behavioral disturbance: Secondary | ICD-10-CM | POA: Diagnosis not present

## 2015-02-12 DIAGNOSIS — F0280 Dementia in other diseases classified elsewhere without behavioral disturbance: Secondary | ICD-10-CM | POA: Diagnosis not present

## 2015-02-12 DIAGNOSIS — G309 Alzheimer's disease, unspecified: Secondary | ICD-10-CM | POA: Diagnosis not present

## 2015-02-12 DIAGNOSIS — M1990 Unspecified osteoarthritis, unspecified site: Secondary | ICD-10-CM | POA: Diagnosis not present

## 2015-02-12 DIAGNOSIS — E119 Type 2 diabetes mellitus without complications: Secondary | ICD-10-CM | POA: Diagnosis not present

## 2015-02-13 DIAGNOSIS — F0280 Dementia in other diseases classified elsewhere without behavioral disturbance: Secondary | ICD-10-CM | POA: Diagnosis not present

## 2015-02-13 DIAGNOSIS — M1990 Unspecified osteoarthritis, unspecified site: Secondary | ICD-10-CM | POA: Diagnosis not present

## 2015-02-13 DIAGNOSIS — G309 Alzheimer's disease, unspecified: Secondary | ICD-10-CM | POA: Diagnosis not present

## 2015-02-13 DIAGNOSIS — E119 Type 2 diabetes mellitus without complications: Secondary | ICD-10-CM | POA: Diagnosis not present

## 2015-02-14 DIAGNOSIS — E119 Type 2 diabetes mellitus without complications: Secondary | ICD-10-CM | POA: Diagnosis not present

## 2015-02-14 DIAGNOSIS — M1990 Unspecified osteoarthritis, unspecified site: Secondary | ICD-10-CM | POA: Diagnosis not present

## 2015-02-14 DIAGNOSIS — F0280 Dementia in other diseases classified elsewhere without behavioral disturbance: Secondary | ICD-10-CM | POA: Diagnosis not present

## 2015-02-14 DIAGNOSIS — G309 Alzheimer's disease, unspecified: Secondary | ICD-10-CM | POA: Diagnosis not present

## 2015-02-15 DIAGNOSIS — F0280 Dementia in other diseases classified elsewhere without behavioral disturbance: Secondary | ICD-10-CM | POA: Diagnosis not present

## 2015-02-15 DIAGNOSIS — E119 Type 2 diabetes mellitus without complications: Secondary | ICD-10-CM | POA: Diagnosis not present

## 2015-02-15 DIAGNOSIS — G309 Alzheimer's disease, unspecified: Secondary | ICD-10-CM | POA: Diagnosis not present

## 2015-02-15 DIAGNOSIS — M1990 Unspecified osteoarthritis, unspecified site: Secondary | ICD-10-CM | POA: Diagnosis not present

## 2015-02-16 DIAGNOSIS — E119 Type 2 diabetes mellitus without complications: Secondary | ICD-10-CM | POA: Diagnosis not present

## 2015-02-16 DIAGNOSIS — M1990 Unspecified osteoarthritis, unspecified site: Secondary | ICD-10-CM | POA: Diagnosis not present

## 2015-02-16 DIAGNOSIS — F0280 Dementia in other diseases classified elsewhere without behavioral disturbance: Secondary | ICD-10-CM | POA: Diagnosis not present

## 2015-02-16 DIAGNOSIS — G309 Alzheimer's disease, unspecified: Secondary | ICD-10-CM | POA: Diagnosis not present

## 2015-02-17 DIAGNOSIS — G309 Alzheimer's disease, unspecified: Secondary | ICD-10-CM | POA: Diagnosis not present

## 2015-02-17 DIAGNOSIS — F0280 Dementia in other diseases classified elsewhere without behavioral disturbance: Secondary | ICD-10-CM | POA: Diagnosis not present

## 2015-02-17 DIAGNOSIS — M1990 Unspecified osteoarthritis, unspecified site: Secondary | ICD-10-CM | POA: Diagnosis not present

## 2015-02-17 DIAGNOSIS — E119 Type 2 diabetes mellitus without complications: Secondary | ICD-10-CM | POA: Diagnosis not present

## 2015-02-18 DIAGNOSIS — F0280 Dementia in other diseases classified elsewhere without behavioral disturbance: Secondary | ICD-10-CM | POA: Diagnosis not present

## 2015-02-18 DIAGNOSIS — G309 Alzheimer's disease, unspecified: Secondary | ICD-10-CM | POA: Diagnosis not present

## 2015-02-18 DIAGNOSIS — M1990 Unspecified osteoarthritis, unspecified site: Secondary | ICD-10-CM | POA: Diagnosis not present

## 2015-02-18 DIAGNOSIS — E119 Type 2 diabetes mellitus without complications: Secondary | ICD-10-CM | POA: Diagnosis not present

## 2015-02-19 DIAGNOSIS — F0280 Dementia in other diseases classified elsewhere without behavioral disturbance: Secondary | ICD-10-CM | POA: Diagnosis not present

## 2015-02-19 DIAGNOSIS — G309 Alzheimer's disease, unspecified: Secondary | ICD-10-CM | POA: Diagnosis not present

## 2015-02-19 DIAGNOSIS — M1990 Unspecified osteoarthritis, unspecified site: Secondary | ICD-10-CM | POA: Diagnosis not present

## 2015-02-19 DIAGNOSIS — E119 Type 2 diabetes mellitus without complications: Secondary | ICD-10-CM | POA: Diagnosis not present

## 2015-02-20 DIAGNOSIS — M1990 Unspecified osteoarthritis, unspecified site: Secondary | ICD-10-CM | POA: Diagnosis not present

## 2015-02-20 DIAGNOSIS — F0280 Dementia in other diseases classified elsewhere without behavioral disturbance: Secondary | ICD-10-CM | POA: Diagnosis not present

## 2015-02-20 DIAGNOSIS — G309 Alzheimer's disease, unspecified: Secondary | ICD-10-CM | POA: Diagnosis not present

## 2015-02-20 DIAGNOSIS — E119 Type 2 diabetes mellitus without complications: Secondary | ICD-10-CM | POA: Diagnosis not present

## 2015-02-21 DIAGNOSIS — M1990 Unspecified osteoarthritis, unspecified site: Secondary | ICD-10-CM | POA: Diagnosis not present

## 2015-02-21 DIAGNOSIS — F0280 Dementia in other diseases classified elsewhere without behavioral disturbance: Secondary | ICD-10-CM | POA: Diagnosis not present

## 2015-02-21 DIAGNOSIS — E119 Type 2 diabetes mellitus without complications: Secondary | ICD-10-CM | POA: Diagnosis not present

## 2015-02-21 DIAGNOSIS — G309 Alzheimer's disease, unspecified: Secondary | ICD-10-CM | POA: Diagnosis not present

## 2015-02-22 DIAGNOSIS — M1990 Unspecified osteoarthritis, unspecified site: Secondary | ICD-10-CM | POA: Diagnosis not present

## 2015-02-22 DIAGNOSIS — F0280 Dementia in other diseases classified elsewhere without behavioral disturbance: Secondary | ICD-10-CM | POA: Diagnosis not present

## 2015-02-22 DIAGNOSIS — G309 Alzheimer's disease, unspecified: Secondary | ICD-10-CM | POA: Diagnosis not present

## 2015-02-22 DIAGNOSIS — E119 Type 2 diabetes mellitus without complications: Secondary | ICD-10-CM | POA: Diagnosis not present

## 2015-02-23 DIAGNOSIS — M1990 Unspecified osteoarthritis, unspecified site: Secondary | ICD-10-CM | POA: Diagnosis not present

## 2015-02-23 DIAGNOSIS — E119 Type 2 diabetes mellitus without complications: Secondary | ICD-10-CM | POA: Diagnosis not present

## 2015-02-23 DIAGNOSIS — F0280 Dementia in other diseases classified elsewhere without behavioral disturbance: Secondary | ICD-10-CM | POA: Diagnosis not present

## 2015-02-23 DIAGNOSIS — G309 Alzheimer's disease, unspecified: Secondary | ICD-10-CM | POA: Diagnosis not present

## 2015-02-24 DIAGNOSIS — F0280 Dementia in other diseases classified elsewhere without behavioral disturbance: Secondary | ICD-10-CM | POA: Diagnosis not present

## 2015-02-24 DIAGNOSIS — M1990 Unspecified osteoarthritis, unspecified site: Secondary | ICD-10-CM | POA: Diagnosis not present

## 2015-02-24 DIAGNOSIS — G309 Alzheimer's disease, unspecified: Secondary | ICD-10-CM | POA: Diagnosis not present

## 2015-02-24 DIAGNOSIS — E119 Type 2 diabetes mellitus without complications: Secondary | ICD-10-CM | POA: Diagnosis not present

## 2015-02-25 DIAGNOSIS — M1990 Unspecified osteoarthritis, unspecified site: Secondary | ICD-10-CM | POA: Diagnosis not present

## 2015-02-25 DIAGNOSIS — G309 Alzheimer's disease, unspecified: Secondary | ICD-10-CM | POA: Diagnosis not present

## 2015-02-25 DIAGNOSIS — E119 Type 2 diabetes mellitus without complications: Secondary | ICD-10-CM | POA: Diagnosis not present

## 2015-02-25 DIAGNOSIS — F0280 Dementia in other diseases classified elsewhere without behavioral disturbance: Secondary | ICD-10-CM | POA: Diagnosis not present

## 2015-02-26 DIAGNOSIS — E119 Type 2 diabetes mellitus without complications: Secondary | ICD-10-CM | POA: Diagnosis not present

## 2015-02-26 DIAGNOSIS — F0280 Dementia in other diseases classified elsewhere without behavioral disturbance: Secondary | ICD-10-CM | POA: Diagnosis not present

## 2015-02-26 DIAGNOSIS — M1990 Unspecified osteoarthritis, unspecified site: Secondary | ICD-10-CM | POA: Diagnosis not present

## 2015-02-26 DIAGNOSIS — G309 Alzheimer's disease, unspecified: Secondary | ICD-10-CM | POA: Diagnosis not present

## 2015-02-27 DIAGNOSIS — G309 Alzheimer's disease, unspecified: Secondary | ICD-10-CM | POA: Diagnosis not present

## 2015-02-27 DIAGNOSIS — M1990 Unspecified osteoarthritis, unspecified site: Secondary | ICD-10-CM | POA: Diagnosis not present

## 2015-02-27 DIAGNOSIS — E119 Type 2 diabetes mellitus without complications: Secondary | ICD-10-CM | POA: Diagnosis not present

## 2015-02-27 DIAGNOSIS — F0280 Dementia in other diseases classified elsewhere without behavioral disturbance: Secondary | ICD-10-CM | POA: Diagnosis not present

## 2015-03-01 DIAGNOSIS — E119 Type 2 diabetes mellitus without complications: Secondary | ICD-10-CM | POA: Diagnosis not present

## 2015-03-01 DIAGNOSIS — G309 Alzheimer's disease, unspecified: Secondary | ICD-10-CM | POA: Diagnosis not present

## 2015-03-01 DIAGNOSIS — M1990 Unspecified osteoarthritis, unspecified site: Secondary | ICD-10-CM | POA: Diagnosis not present

## 2015-03-01 DIAGNOSIS — F0280 Dementia in other diseases classified elsewhere without behavioral disturbance: Secondary | ICD-10-CM | POA: Diagnosis not present

## 2015-03-02 DIAGNOSIS — E119 Type 2 diabetes mellitus without complications: Secondary | ICD-10-CM | POA: Diagnosis not present

## 2015-03-02 DIAGNOSIS — M1990 Unspecified osteoarthritis, unspecified site: Secondary | ICD-10-CM | POA: Diagnosis not present

## 2015-03-02 DIAGNOSIS — F0280 Dementia in other diseases classified elsewhere without behavioral disturbance: Secondary | ICD-10-CM | POA: Diagnosis not present

## 2015-03-02 DIAGNOSIS — G309 Alzheimer's disease, unspecified: Secondary | ICD-10-CM | POA: Diagnosis not present

## 2015-03-03 DIAGNOSIS — G309 Alzheimer's disease, unspecified: Secondary | ICD-10-CM | POA: Diagnosis not present

## 2015-03-03 DIAGNOSIS — E119 Type 2 diabetes mellitus without complications: Secondary | ICD-10-CM | POA: Diagnosis not present

## 2015-03-03 DIAGNOSIS — F0280 Dementia in other diseases classified elsewhere without behavioral disturbance: Secondary | ICD-10-CM | POA: Diagnosis not present

## 2015-03-03 DIAGNOSIS — M1990 Unspecified osteoarthritis, unspecified site: Secondary | ICD-10-CM | POA: Diagnosis not present

## 2015-03-04 DIAGNOSIS — M1990 Unspecified osteoarthritis, unspecified site: Secondary | ICD-10-CM | POA: Diagnosis not present

## 2015-03-04 DIAGNOSIS — F0280 Dementia in other diseases classified elsewhere without behavioral disturbance: Secondary | ICD-10-CM | POA: Diagnosis not present

## 2015-03-04 DIAGNOSIS — G309 Alzheimer's disease, unspecified: Secondary | ICD-10-CM | POA: Diagnosis not present

## 2015-03-04 DIAGNOSIS — E119 Type 2 diabetes mellitus without complications: Secondary | ICD-10-CM | POA: Diagnosis not present

## 2015-03-05 DIAGNOSIS — M1990 Unspecified osteoarthritis, unspecified site: Secondary | ICD-10-CM | POA: Diagnosis not present

## 2015-03-05 DIAGNOSIS — G309 Alzheimer's disease, unspecified: Secondary | ICD-10-CM | POA: Diagnosis not present

## 2015-03-05 DIAGNOSIS — F0280 Dementia in other diseases classified elsewhere without behavioral disturbance: Secondary | ICD-10-CM | POA: Diagnosis not present

## 2015-03-05 DIAGNOSIS — E119 Type 2 diabetes mellitus without complications: Secondary | ICD-10-CM | POA: Diagnosis not present

## 2015-03-06 DIAGNOSIS — F0280 Dementia in other diseases classified elsewhere without behavioral disturbance: Secondary | ICD-10-CM | POA: Diagnosis not present

## 2015-03-06 DIAGNOSIS — E119 Type 2 diabetes mellitus without complications: Secondary | ICD-10-CM | POA: Diagnosis not present

## 2015-03-06 DIAGNOSIS — G309 Alzheimer's disease, unspecified: Secondary | ICD-10-CM | POA: Diagnosis not present

## 2015-03-06 DIAGNOSIS — M1990 Unspecified osteoarthritis, unspecified site: Secondary | ICD-10-CM | POA: Diagnosis not present

## 2015-03-07 DIAGNOSIS — E119 Type 2 diabetes mellitus without complications: Secondary | ICD-10-CM | POA: Diagnosis not present

## 2015-03-07 DIAGNOSIS — G309 Alzheimer's disease, unspecified: Secondary | ICD-10-CM | POA: Diagnosis not present

## 2015-03-07 DIAGNOSIS — F0280 Dementia in other diseases classified elsewhere without behavioral disturbance: Secondary | ICD-10-CM | POA: Diagnosis not present

## 2015-03-07 DIAGNOSIS — M1990 Unspecified osteoarthritis, unspecified site: Secondary | ICD-10-CM | POA: Diagnosis not present

## 2015-03-08 DIAGNOSIS — M1990 Unspecified osteoarthritis, unspecified site: Secondary | ICD-10-CM | POA: Diagnosis not present

## 2015-03-08 DIAGNOSIS — F0280 Dementia in other diseases classified elsewhere without behavioral disturbance: Secondary | ICD-10-CM | POA: Diagnosis not present

## 2015-03-08 DIAGNOSIS — G309 Alzheimer's disease, unspecified: Secondary | ICD-10-CM | POA: Diagnosis not present

## 2015-03-08 DIAGNOSIS — E119 Type 2 diabetes mellitus without complications: Secondary | ICD-10-CM | POA: Diagnosis not present

## 2015-03-09 DIAGNOSIS — E119 Type 2 diabetes mellitus without complications: Secondary | ICD-10-CM | POA: Diagnosis not present

## 2015-03-09 DIAGNOSIS — G309 Alzheimer's disease, unspecified: Secondary | ICD-10-CM | POA: Diagnosis not present

## 2015-03-09 DIAGNOSIS — M1990 Unspecified osteoarthritis, unspecified site: Secondary | ICD-10-CM | POA: Diagnosis not present

## 2015-03-09 DIAGNOSIS — F0280 Dementia in other diseases classified elsewhere without behavioral disturbance: Secondary | ICD-10-CM | POA: Diagnosis not present

## 2015-03-10 DIAGNOSIS — F0280 Dementia in other diseases classified elsewhere without behavioral disturbance: Secondary | ICD-10-CM | POA: Diagnosis not present

## 2015-03-10 DIAGNOSIS — G309 Alzheimer's disease, unspecified: Secondary | ICD-10-CM | POA: Diagnosis not present

## 2015-03-10 DIAGNOSIS — E119 Type 2 diabetes mellitus without complications: Secondary | ICD-10-CM | POA: Diagnosis not present

## 2015-03-10 DIAGNOSIS — M1990 Unspecified osteoarthritis, unspecified site: Secondary | ICD-10-CM | POA: Diagnosis not present

## 2015-03-11 DIAGNOSIS — E119 Type 2 diabetes mellitus without complications: Secondary | ICD-10-CM | POA: Diagnosis not present

## 2015-03-11 DIAGNOSIS — G309 Alzheimer's disease, unspecified: Secondary | ICD-10-CM | POA: Diagnosis not present

## 2015-03-11 DIAGNOSIS — M1990 Unspecified osteoarthritis, unspecified site: Secondary | ICD-10-CM | POA: Diagnosis not present

## 2015-03-11 DIAGNOSIS — F0280 Dementia in other diseases classified elsewhere without behavioral disturbance: Secondary | ICD-10-CM | POA: Diagnosis not present

## 2015-03-12 DIAGNOSIS — G309 Alzheimer's disease, unspecified: Secondary | ICD-10-CM | POA: Diagnosis not present

## 2015-03-12 DIAGNOSIS — E119 Type 2 diabetes mellitus without complications: Secondary | ICD-10-CM | POA: Diagnosis not present

## 2015-03-12 DIAGNOSIS — M1990 Unspecified osteoarthritis, unspecified site: Secondary | ICD-10-CM | POA: Diagnosis not present

## 2015-03-12 DIAGNOSIS — F0280 Dementia in other diseases classified elsewhere without behavioral disturbance: Secondary | ICD-10-CM | POA: Diagnosis not present

## 2015-03-13 DIAGNOSIS — M1990 Unspecified osteoarthritis, unspecified site: Secondary | ICD-10-CM | POA: Diagnosis not present

## 2015-03-13 DIAGNOSIS — E119 Type 2 diabetes mellitus without complications: Secondary | ICD-10-CM | POA: Diagnosis not present

## 2015-03-13 DIAGNOSIS — G309 Alzheimer's disease, unspecified: Secondary | ICD-10-CM | POA: Diagnosis not present

## 2015-03-13 DIAGNOSIS — F0280 Dementia in other diseases classified elsewhere without behavioral disturbance: Secondary | ICD-10-CM | POA: Diagnosis not present

## 2015-03-14 DIAGNOSIS — E119 Type 2 diabetes mellitus without complications: Secondary | ICD-10-CM | POA: Diagnosis not present

## 2015-03-14 DIAGNOSIS — M1990 Unspecified osteoarthritis, unspecified site: Secondary | ICD-10-CM | POA: Diagnosis not present

## 2015-03-14 DIAGNOSIS — F0280 Dementia in other diseases classified elsewhere without behavioral disturbance: Secondary | ICD-10-CM | POA: Diagnosis not present

## 2015-03-14 DIAGNOSIS — G309 Alzheimer's disease, unspecified: Secondary | ICD-10-CM | POA: Diagnosis not present

## 2015-03-15 DIAGNOSIS — M1990 Unspecified osteoarthritis, unspecified site: Secondary | ICD-10-CM | POA: Diagnosis not present

## 2015-03-15 DIAGNOSIS — F0280 Dementia in other diseases classified elsewhere without behavioral disturbance: Secondary | ICD-10-CM | POA: Diagnosis not present

## 2015-03-15 DIAGNOSIS — G309 Alzheimer's disease, unspecified: Secondary | ICD-10-CM | POA: Diagnosis not present

## 2015-03-15 DIAGNOSIS — E119 Type 2 diabetes mellitus without complications: Secondary | ICD-10-CM | POA: Diagnosis not present

## 2015-03-16 DIAGNOSIS — M1990 Unspecified osteoarthritis, unspecified site: Secondary | ICD-10-CM | POA: Diagnosis not present

## 2015-03-16 DIAGNOSIS — F0280 Dementia in other diseases classified elsewhere without behavioral disturbance: Secondary | ICD-10-CM | POA: Diagnosis not present

## 2015-03-16 DIAGNOSIS — E119 Type 2 diabetes mellitus without complications: Secondary | ICD-10-CM | POA: Diagnosis not present

## 2015-03-16 DIAGNOSIS — G309 Alzheimer's disease, unspecified: Secondary | ICD-10-CM | POA: Diagnosis not present

## 2015-03-17 DIAGNOSIS — F0280 Dementia in other diseases classified elsewhere without behavioral disturbance: Secondary | ICD-10-CM | POA: Diagnosis not present

## 2015-03-17 DIAGNOSIS — G309 Alzheimer's disease, unspecified: Secondary | ICD-10-CM | POA: Diagnosis not present

## 2015-03-17 DIAGNOSIS — M1990 Unspecified osteoarthritis, unspecified site: Secondary | ICD-10-CM | POA: Diagnosis not present

## 2015-03-17 DIAGNOSIS — E119 Type 2 diabetes mellitus without complications: Secondary | ICD-10-CM | POA: Diagnosis not present

## 2015-03-18 DIAGNOSIS — G309 Alzheimer's disease, unspecified: Secondary | ICD-10-CM | POA: Diagnosis not present

## 2015-03-18 DIAGNOSIS — E119 Type 2 diabetes mellitus without complications: Secondary | ICD-10-CM | POA: Diagnosis not present

## 2015-03-18 DIAGNOSIS — M1990 Unspecified osteoarthritis, unspecified site: Secondary | ICD-10-CM | POA: Diagnosis not present

## 2015-03-18 DIAGNOSIS — F0280 Dementia in other diseases classified elsewhere without behavioral disturbance: Secondary | ICD-10-CM | POA: Diagnosis not present

## 2015-03-20 DIAGNOSIS — E119 Type 2 diabetes mellitus without complications: Secondary | ICD-10-CM | POA: Diagnosis not present

## 2015-03-20 DIAGNOSIS — F0280 Dementia in other diseases classified elsewhere without behavioral disturbance: Secondary | ICD-10-CM | POA: Diagnosis not present

## 2015-03-20 DIAGNOSIS — M1990 Unspecified osteoarthritis, unspecified site: Secondary | ICD-10-CM | POA: Diagnosis not present

## 2015-03-20 DIAGNOSIS — G309 Alzheimer's disease, unspecified: Secondary | ICD-10-CM | POA: Diagnosis not present

## 2015-03-21 DIAGNOSIS — G309 Alzheimer's disease, unspecified: Secondary | ICD-10-CM | POA: Diagnosis not present

## 2015-03-21 DIAGNOSIS — M1990 Unspecified osteoarthritis, unspecified site: Secondary | ICD-10-CM | POA: Diagnosis not present

## 2015-03-21 DIAGNOSIS — F0280 Dementia in other diseases classified elsewhere without behavioral disturbance: Secondary | ICD-10-CM | POA: Diagnosis not present

## 2015-03-21 DIAGNOSIS — E119 Type 2 diabetes mellitus without complications: Secondary | ICD-10-CM | POA: Diagnosis not present

## 2015-03-22 DIAGNOSIS — E119 Type 2 diabetes mellitus without complications: Secondary | ICD-10-CM | POA: Diagnosis not present

## 2015-03-22 DIAGNOSIS — G309 Alzheimer's disease, unspecified: Secondary | ICD-10-CM | POA: Diagnosis not present

## 2015-03-22 DIAGNOSIS — M1990 Unspecified osteoarthritis, unspecified site: Secondary | ICD-10-CM | POA: Diagnosis not present

## 2015-03-22 DIAGNOSIS — F0280 Dementia in other diseases classified elsewhere without behavioral disturbance: Secondary | ICD-10-CM | POA: Diagnosis not present

## 2015-03-23 DIAGNOSIS — E119 Type 2 diabetes mellitus without complications: Secondary | ICD-10-CM | POA: Diagnosis not present

## 2015-03-23 DIAGNOSIS — G309 Alzheimer's disease, unspecified: Secondary | ICD-10-CM | POA: Diagnosis not present

## 2015-03-23 DIAGNOSIS — F0280 Dementia in other diseases classified elsewhere without behavioral disturbance: Secondary | ICD-10-CM | POA: Diagnosis not present

## 2015-03-23 DIAGNOSIS — M1990 Unspecified osteoarthritis, unspecified site: Secondary | ICD-10-CM | POA: Diagnosis not present

## 2015-03-24 DIAGNOSIS — G309 Alzheimer's disease, unspecified: Secondary | ICD-10-CM | POA: Diagnosis not present

## 2015-03-24 DIAGNOSIS — E119 Type 2 diabetes mellitus without complications: Secondary | ICD-10-CM | POA: Diagnosis not present

## 2015-03-24 DIAGNOSIS — F0280 Dementia in other diseases classified elsewhere without behavioral disturbance: Secondary | ICD-10-CM | POA: Diagnosis not present

## 2015-03-24 DIAGNOSIS — M1990 Unspecified osteoarthritis, unspecified site: Secondary | ICD-10-CM | POA: Diagnosis not present

## 2015-03-25 DIAGNOSIS — M1990 Unspecified osteoarthritis, unspecified site: Secondary | ICD-10-CM | POA: Diagnosis not present

## 2015-03-25 DIAGNOSIS — F0280 Dementia in other diseases classified elsewhere without behavioral disturbance: Secondary | ICD-10-CM | POA: Diagnosis not present

## 2015-03-25 DIAGNOSIS — G309 Alzheimer's disease, unspecified: Secondary | ICD-10-CM | POA: Diagnosis not present

## 2015-03-25 DIAGNOSIS — E119 Type 2 diabetes mellitus without complications: Secondary | ICD-10-CM | POA: Diagnosis not present

## 2015-03-26 DIAGNOSIS — G309 Alzheimer's disease, unspecified: Secondary | ICD-10-CM | POA: Diagnosis not present

## 2015-03-26 DIAGNOSIS — F0280 Dementia in other diseases classified elsewhere without behavioral disturbance: Secondary | ICD-10-CM | POA: Diagnosis not present

## 2015-03-26 DIAGNOSIS — E119 Type 2 diabetes mellitus without complications: Secondary | ICD-10-CM | POA: Diagnosis not present

## 2015-03-26 DIAGNOSIS — M1990 Unspecified osteoarthritis, unspecified site: Secondary | ICD-10-CM | POA: Diagnosis not present

## 2015-03-27 DIAGNOSIS — E119 Type 2 diabetes mellitus without complications: Secondary | ICD-10-CM | POA: Diagnosis not present

## 2015-03-27 DIAGNOSIS — F0280 Dementia in other diseases classified elsewhere without behavioral disturbance: Secondary | ICD-10-CM | POA: Diagnosis not present

## 2015-03-27 DIAGNOSIS — G309 Alzheimer's disease, unspecified: Secondary | ICD-10-CM | POA: Diagnosis not present

## 2015-03-27 DIAGNOSIS — M1990 Unspecified osteoarthritis, unspecified site: Secondary | ICD-10-CM | POA: Diagnosis not present

## 2015-03-28 DIAGNOSIS — G309 Alzheimer's disease, unspecified: Secondary | ICD-10-CM | POA: Diagnosis not present

## 2015-03-28 DIAGNOSIS — M1990 Unspecified osteoarthritis, unspecified site: Secondary | ICD-10-CM | POA: Diagnosis not present

## 2015-03-28 DIAGNOSIS — E119 Type 2 diabetes mellitus without complications: Secondary | ICD-10-CM | POA: Diagnosis not present

## 2015-03-28 DIAGNOSIS — F0280 Dementia in other diseases classified elsewhere without behavioral disturbance: Secondary | ICD-10-CM | POA: Diagnosis not present

## 2015-03-29 DIAGNOSIS — M1990 Unspecified osteoarthritis, unspecified site: Secondary | ICD-10-CM | POA: Diagnosis not present

## 2015-03-29 DIAGNOSIS — G309 Alzheimer's disease, unspecified: Secondary | ICD-10-CM | POA: Diagnosis not present

## 2015-03-29 DIAGNOSIS — E119 Type 2 diabetes mellitus without complications: Secondary | ICD-10-CM | POA: Diagnosis not present

## 2015-03-29 DIAGNOSIS — F0280 Dementia in other diseases classified elsewhere without behavioral disturbance: Secondary | ICD-10-CM | POA: Diagnosis not present

## 2015-03-30 DIAGNOSIS — M1990 Unspecified osteoarthritis, unspecified site: Secondary | ICD-10-CM | POA: Diagnosis not present

## 2015-03-30 DIAGNOSIS — F0280 Dementia in other diseases classified elsewhere without behavioral disturbance: Secondary | ICD-10-CM | POA: Diagnosis not present

## 2015-03-30 DIAGNOSIS — E119 Type 2 diabetes mellitus without complications: Secondary | ICD-10-CM | POA: Diagnosis not present

## 2015-03-30 DIAGNOSIS — G309 Alzheimer's disease, unspecified: Secondary | ICD-10-CM | POA: Diagnosis not present

## 2015-03-31 DIAGNOSIS — E119 Type 2 diabetes mellitus without complications: Secondary | ICD-10-CM | POA: Diagnosis not present

## 2015-03-31 DIAGNOSIS — M1990 Unspecified osteoarthritis, unspecified site: Secondary | ICD-10-CM | POA: Diagnosis not present

## 2015-03-31 DIAGNOSIS — G309 Alzheimer's disease, unspecified: Secondary | ICD-10-CM | POA: Diagnosis not present

## 2015-03-31 DIAGNOSIS — F0280 Dementia in other diseases classified elsewhere without behavioral disturbance: Secondary | ICD-10-CM | POA: Diagnosis not present

## 2015-04-01 DIAGNOSIS — F0280 Dementia in other diseases classified elsewhere without behavioral disturbance: Secondary | ICD-10-CM | POA: Diagnosis not present

## 2015-04-01 DIAGNOSIS — M1990 Unspecified osteoarthritis, unspecified site: Secondary | ICD-10-CM | POA: Diagnosis not present

## 2015-04-01 DIAGNOSIS — E119 Type 2 diabetes mellitus without complications: Secondary | ICD-10-CM | POA: Diagnosis not present

## 2015-04-01 DIAGNOSIS — G309 Alzheimer's disease, unspecified: Secondary | ICD-10-CM | POA: Diagnosis not present

## 2015-04-02 DIAGNOSIS — F0280 Dementia in other diseases classified elsewhere without behavioral disturbance: Secondary | ICD-10-CM | POA: Diagnosis not present

## 2015-04-02 DIAGNOSIS — M1990 Unspecified osteoarthritis, unspecified site: Secondary | ICD-10-CM | POA: Diagnosis not present

## 2015-04-02 DIAGNOSIS — G309 Alzheimer's disease, unspecified: Secondary | ICD-10-CM | POA: Diagnosis not present

## 2015-04-02 DIAGNOSIS — E119 Type 2 diabetes mellitus without complications: Secondary | ICD-10-CM | POA: Diagnosis not present

## 2015-04-03 DIAGNOSIS — E119 Type 2 diabetes mellitus without complications: Secondary | ICD-10-CM | POA: Diagnosis not present

## 2015-04-03 DIAGNOSIS — M1990 Unspecified osteoarthritis, unspecified site: Secondary | ICD-10-CM | POA: Diagnosis not present

## 2015-04-03 DIAGNOSIS — F0280 Dementia in other diseases classified elsewhere without behavioral disturbance: Secondary | ICD-10-CM | POA: Diagnosis not present

## 2015-04-03 DIAGNOSIS — G309 Alzheimer's disease, unspecified: Secondary | ICD-10-CM | POA: Diagnosis not present

## 2015-04-04 DIAGNOSIS — G309 Alzheimer's disease, unspecified: Secondary | ICD-10-CM | POA: Diagnosis not present

## 2015-04-04 DIAGNOSIS — E119 Type 2 diabetes mellitus without complications: Secondary | ICD-10-CM | POA: Diagnosis not present

## 2015-04-04 DIAGNOSIS — M1990 Unspecified osteoarthritis, unspecified site: Secondary | ICD-10-CM | POA: Diagnosis not present

## 2015-04-04 DIAGNOSIS — F0280 Dementia in other diseases classified elsewhere without behavioral disturbance: Secondary | ICD-10-CM | POA: Diagnosis not present

## 2015-04-05 DIAGNOSIS — F0280 Dementia in other diseases classified elsewhere without behavioral disturbance: Secondary | ICD-10-CM | POA: Diagnosis not present

## 2015-04-05 DIAGNOSIS — G309 Alzheimer's disease, unspecified: Secondary | ICD-10-CM | POA: Diagnosis not present

## 2015-04-05 DIAGNOSIS — E119 Type 2 diabetes mellitus without complications: Secondary | ICD-10-CM | POA: Diagnosis not present

## 2015-04-05 DIAGNOSIS — M1990 Unspecified osteoarthritis, unspecified site: Secondary | ICD-10-CM | POA: Diagnosis not present

## 2015-04-06 DIAGNOSIS — F0280 Dementia in other diseases classified elsewhere without behavioral disturbance: Secondary | ICD-10-CM | POA: Diagnosis not present

## 2015-04-06 DIAGNOSIS — E119 Type 2 diabetes mellitus without complications: Secondary | ICD-10-CM | POA: Diagnosis not present

## 2015-04-06 DIAGNOSIS — M1990 Unspecified osteoarthritis, unspecified site: Secondary | ICD-10-CM | POA: Diagnosis not present

## 2015-04-06 DIAGNOSIS — G309 Alzheimer's disease, unspecified: Secondary | ICD-10-CM | POA: Diagnosis not present

## 2015-04-07 DIAGNOSIS — M1990 Unspecified osteoarthritis, unspecified site: Secondary | ICD-10-CM | POA: Diagnosis not present

## 2015-04-07 DIAGNOSIS — E119 Type 2 diabetes mellitus without complications: Secondary | ICD-10-CM | POA: Diagnosis not present

## 2015-04-07 DIAGNOSIS — G309 Alzheimer's disease, unspecified: Secondary | ICD-10-CM | POA: Diagnosis not present

## 2015-04-07 DIAGNOSIS — F0280 Dementia in other diseases classified elsewhere without behavioral disturbance: Secondary | ICD-10-CM | POA: Diagnosis not present

## 2015-04-08 DIAGNOSIS — M1990 Unspecified osteoarthritis, unspecified site: Secondary | ICD-10-CM | POA: Diagnosis not present

## 2015-04-08 DIAGNOSIS — E119 Type 2 diabetes mellitus without complications: Secondary | ICD-10-CM | POA: Diagnosis not present

## 2015-04-08 DIAGNOSIS — F0280 Dementia in other diseases classified elsewhere without behavioral disturbance: Secondary | ICD-10-CM | POA: Diagnosis not present

## 2015-04-08 DIAGNOSIS — G309 Alzheimer's disease, unspecified: Secondary | ICD-10-CM | POA: Diagnosis not present

## 2015-04-09 DIAGNOSIS — E119 Type 2 diabetes mellitus without complications: Secondary | ICD-10-CM | POA: Diagnosis not present

## 2015-04-09 DIAGNOSIS — M1990 Unspecified osteoarthritis, unspecified site: Secondary | ICD-10-CM | POA: Diagnosis not present

## 2015-04-09 DIAGNOSIS — G309 Alzheimer's disease, unspecified: Secondary | ICD-10-CM | POA: Diagnosis not present

## 2015-04-09 DIAGNOSIS — F0280 Dementia in other diseases classified elsewhere without behavioral disturbance: Secondary | ICD-10-CM | POA: Diagnosis not present

## 2015-04-10 DIAGNOSIS — F0280 Dementia in other diseases classified elsewhere without behavioral disturbance: Secondary | ICD-10-CM | POA: Diagnosis not present

## 2015-04-10 DIAGNOSIS — E119 Type 2 diabetes mellitus without complications: Secondary | ICD-10-CM | POA: Diagnosis not present

## 2015-04-10 DIAGNOSIS — M1990 Unspecified osteoarthritis, unspecified site: Secondary | ICD-10-CM | POA: Diagnosis not present

## 2015-04-10 DIAGNOSIS — G309 Alzheimer's disease, unspecified: Secondary | ICD-10-CM | POA: Diagnosis not present

## 2015-04-11 DIAGNOSIS — E119 Type 2 diabetes mellitus without complications: Secondary | ICD-10-CM | POA: Diagnosis not present

## 2015-04-11 DIAGNOSIS — G309 Alzheimer's disease, unspecified: Secondary | ICD-10-CM | POA: Diagnosis not present

## 2015-04-11 DIAGNOSIS — M1990 Unspecified osteoarthritis, unspecified site: Secondary | ICD-10-CM | POA: Diagnosis not present

## 2015-04-11 DIAGNOSIS — F0280 Dementia in other diseases classified elsewhere without behavioral disturbance: Secondary | ICD-10-CM | POA: Diagnosis not present

## 2015-04-12 DIAGNOSIS — F0280 Dementia in other diseases classified elsewhere without behavioral disturbance: Secondary | ICD-10-CM | POA: Diagnosis not present

## 2015-04-12 DIAGNOSIS — G309 Alzheimer's disease, unspecified: Secondary | ICD-10-CM | POA: Diagnosis not present

## 2015-04-12 DIAGNOSIS — E119 Type 2 diabetes mellitus without complications: Secondary | ICD-10-CM | POA: Diagnosis not present

## 2015-04-12 DIAGNOSIS — M1990 Unspecified osteoarthritis, unspecified site: Secondary | ICD-10-CM | POA: Diagnosis not present

## 2015-04-13 DIAGNOSIS — E119 Type 2 diabetes mellitus without complications: Secondary | ICD-10-CM | POA: Diagnosis not present

## 2015-04-13 DIAGNOSIS — F0280 Dementia in other diseases classified elsewhere without behavioral disturbance: Secondary | ICD-10-CM | POA: Diagnosis not present

## 2015-04-13 DIAGNOSIS — M1990 Unspecified osteoarthritis, unspecified site: Secondary | ICD-10-CM | POA: Diagnosis not present

## 2015-04-13 DIAGNOSIS — G309 Alzheimer's disease, unspecified: Secondary | ICD-10-CM | POA: Diagnosis not present

## 2015-04-14 DIAGNOSIS — E119 Type 2 diabetes mellitus without complications: Secondary | ICD-10-CM | POA: Diagnosis not present

## 2015-04-14 DIAGNOSIS — G309 Alzheimer's disease, unspecified: Secondary | ICD-10-CM | POA: Diagnosis not present

## 2015-04-14 DIAGNOSIS — M1990 Unspecified osteoarthritis, unspecified site: Secondary | ICD-10-CM | POA: Diagnosis not present

## 2015-04-14 DIAGNOSIS — F0280 Dementia in other diseases classified elsewhere without behavioral disturbance: Secondary | ICD-10-CM | POA: Diagnosis not present

## 2015-04-15 DIAGNOSIS — E119 Type 2 diabetes mellitus without complications: Secondary | ICD-10-CM | POA: Diagnosis not present

## 2015-04-15 DIAGNOSIS — M1990 Unspecified osteoarthritis, unspecified site: Secondary | ICD-10-CM | POA: Diagnosis not present

## 2015-04-15 DIAGNOSIS — G309 Alzheimer's disease, unspecified: Secondary | ICD-10-CM | POA: Diagnosis not present

## 2015-04-15 DIAGNOSIS — F0280 Dementia in other diseases classified elsewhere without behavioral disturbance: Secondary | ICD-10-CM | POA: Diagnosis not present

## 2015-04-16 DIAGNOSIS — F0280 Dementia in other diseases classified elsewhere without behavioral disturbance: Secondary | ICD-10-CM | POA: Diagnosis not present

## 2015-04-16 DIAGNOSIS — G309 Alzheimer's disease, unspecified: Secondary | ICD-10-CM | POA: Diagnosis not present

## 2015-04-16 DIAGNOSIS — M1990 Unspecified osteoarthritis, unspecified site: Secondary | ICD-10-CM | POA: Diagnosis not present

## 2015-04-16 DIAGNOSIS — E119 Type 2 diabetes mellitus without complications: Secondary | ICD-10-CM | POA: Diagnosis not present

## 2015-04-17 DIAGNOSIS — G309 Alzheimer's disease, unspecified: Secondary | ICD-10-CM | POA: Diagnosis not present

## 2015-04-17 DIAGNOSIS — F0280 Dementia in other diseases classified elsewhere without behavioral disturbance: Secondary | ICD-10-CM | POA: Diagnosis not present

## 2015-04-17 DIAGNOSIS — M1990 Unspecified osteoarthritis, unspecified site: Secondary | ICD-10-CM | POA: Diagnosis not present

## 2015-04-17 DIAGNOSIS — E119 Type 2 diabetes mellitus without complications: Secondary | ICD-10-CM | POA: Diagnosis not present

## 2015-04-18 DIAGNOSIS — E119 Type 2 diabetes mellitus without complications: Secondary | ICD-10-CM | POA: Diagnosis not present

## 2015-04-18 DIAGNOSIS — F0280 Dementia in other diseases classified elsewhere without behavioral disturbance: Secondary | ICD-10-CM | POA: Diagnosis not present

## 2015-04-18 DIAGNOSIS — G309 Alzheimer's disease, unspecified: Secondary | ICD-10-CM | POA: Diagnosis not present

## 2015-04-18 DIAGNOSIS — M1990 Unspecified osteoarthritis, unspecified site: Secondary | ICD-10-CM | POA: Diagnosis not present

## 2015-04-19 DIAGNOSIS — E119 Type 2 diabetes mellitus without complications: Secondary | ICD-10-CM | POA: Diagnosis not present

## 2015-04-19 DIAGNOSIS — M1990 Unspecified osteoarthritis, unspecified site: Secondary | ICD-10-CM | POA: Diagnosis not present

## 2015-04-19 DIAGNOSIS — F0280 Dementia in other diseases classified elsewhere without behavioral disturbance: Secondary | ICD-10-CM | POA: Diagnosis not present

## 2015-04-19 DIAGNOSIS — G309 Alzheimer's disease, unspecified: Secondary | ICD-10-CM | POA: Diagnosis not present

## 2015-04-20 DIAGNOSIS — E119 Type 2 diabetes mellitus without complications: Secondary | ICD-10-CM | POA: Diagnosis not present

## 2015-04-20 DIAGNOSIS — F0280 Dementia in other diseases classified elsewhere without behavioral disturbance: Secondary | ICD-10-CM | POA: Diagnosis not present

## 2015-04-20 DIAGNOSIS — M1990 Unspecified osteoarthritis, unspecified site: Secondary | ICD-10-CM | POA: Diagnosis not present

## 2015-04-20 DIAGNOSIS — G309 Alzheimer's disease, unspecified: Secondary | ICD-10-CM | POA: Diagnosis not present

## 2015-04-21 DIAGNOSIS — G309 Alzheimer's disease, unspecified: Secondary | ICD-10-CM | POA: Diagnosis not present

## 2015-04-21 DIAGNOSIS — F0280 Dementia in other diseases classified elsewhere without behavioral disturbance: Secondary | ICD-10-CM | POA: Diagnosis not present

## 2015-04-21 DIAGNOSIS — E119 Type 2 diabetes mellitus without complications: Secondary | ICD-10-CM | POA: Diagnosis not present

## 2015-04-21 DIAGNOSIS — M1990 Unspecified osteoarthritis, unspecified site: Secondary | ICD-10-CM | POA: Diagnosis not present

## 2015-04-22 DIAGNOSIS — E119 Type 2 diabetes mellitus without complications: Secondary | ICD-10-CM | POA: Diagnosis not present

## 2015-04-22 DIAGNOSIS — G309 Alzheimer's disease, unspecified: Secondary | ICD-10-CM | POA: Diagnosis not present

## 2015-04-22 DIAGNOSIS — F0280 Dementia in other diseases classified elsewhere without behavioral disturbance: Secondary | ICD-10-CM | POA: Diagnosis not present

## 2015-04-22 DIAGNOSIS — M1990 Unspecified osteoarthritis, unspecified site: Secondary | ICD-10-CM | POA: Diagnosis not present

## 2015-04-23 DIAGNOSIS — F0280 Dementia in other diseases classified elsewhere without behavioral disturbance: Secondary | ICD-10-CM | POA: Diagnosis not present

## 2015-04-23 DIAGNOSIS — M1990 Unspecified osteoarthritis, unspecified site: Secondary | ICD-10-CM | POA: Diagnosis not present

## 2015-04-23 DIAGNOSIS — E119 Type 2 diabetes mellitus without complications: Secondary | ICD-10-CM | POA: Diagnosis not present

## 2015-04-23 DIAGNOSIS — G309 Alzheimer's disease, unspecified: Secondary | ICD-10-CM | POA: Diagnosis not present

## 2015-04-24 DIAGNOSIS — F0280 Dementia in other diseases classified elsewhere without behavioral disturbance: Secondary | ICD-10-CM | POA: Diagnosis not present

## 2015-04-24 DIAGNOSIS — G309 Alzheimer's disease, unspecified: Secondary | ICD-10-CM | POA: Diagnosis not present

## 2015-04-24 DIAGNOSIS — E119 Type 2 diabetes mellitus without complications: Secondary | ICD-10-CM | POA: Diagnosis not present

## 2015-04-24 DIAGNOSIS — M1990 Unspecified osteoarthritis, unspecified site: Secondary | ICD-10-CM | POA: Diagnosis not present

## 2015-04-25 DIAGNOSIS — F0280 Dementia in other diseases classified elsewhere without behavioral disturbance: Secondary | ICD-10-CM | POA: Diagnosis not present

## 2015-04-25 DIAGNOSIS — M1990 Unspecified osteoarthritis, unspecified site: Secondary | ICD-10-CM | POA: Diagnosis not present

## 2015-04-25 DIAGNOSIS — G309 Alzheimer's disease, unspecified: Secondary | ICD-10-CM | POA: Diagnosis not present

## 2015-04-25 DIAGNOSIS — E119 Type 2 diabetes mellitus without complications: Secondary | ICD-10-CM | POA: Diagnosis not present

## 2015-04-26 DIAGNOSIS — E119 Type 2 diabetes mellitus without complications: Secondary | ICD-10-CM | POA: Diagnosis not present

## 2015-04-26 DIAGNOSIS — M1990 Unspecified osteoarthritis, unspecified site: Secondary | ICD-10-CM | POA: Diagnosis not present

## 2015-04-26 DIAGNOSIS — F0280 Dementia in other diseases classified elsewhere without behavioral disturbance: Secondary | ICD-10-CM | POA: Diagnosis not present

## 2015-04-26 DIAGNOSIS — G309 Alzheimer's disease, unspecified: Secondary | ICD-10-CM | POA: Diagnosis not present

## 2015-04-27 DIAGNOSIS — F0280 Dementia in other diseases classified elsewhere without behavioral disturbance: Secondary | ICD-10-CM | POA: Diagnosis not present

## 2015-04-27 DIAGNOSIS — M1990 Unspecified osteoarthritis, unspecified site: Secondary | ICD-10-CM | POA: Diagnosis not present

## 2015-04-27 DIAGNOSIS — G309 Alzheimer's disease, unspecified: Secondary | ICD-10-CM | POA: Diagnosis not present

## 2015-04-27 DIAGNOSIS — E119 Type 2 diabetes mellitus without complications: Secondary | ICD-10-CM | POA: Diagnosis not present

## 2015-04-28 DIAGNOSIS — E119 Type 2 diabetes mellitus without complications: Secondary | ICD-10-CM | POA: Diagnosis not present

## 2015-04-28 DIAGNOSIS — G309 Alzheimer's disease, unspecified: Secondary | ICD-10-CM | POA: Diagnosis not present

## 2015-04-28 DIAGNOSIS — F0280 Dementia in other diseases classified elsewhere without behavioral disturbance: Secondary | ICD-10-CM | POA: Diagnosis not present

## 2015-04-28 DIAGNOSIS — M1990 Unspecified osteoarthritis, unspecified site: Secondary | ICD-10-CM | POA: Diagnosis not present

## 2015-04-29 DIAGNOSIS — F0280 Dementia in other diseases classified elsewhere without behavioral disturbance: Secondary | ICD-10-CM | POA: Diagnosis not present

## 2015-04-29 DIAGNOSIS — G309 Alzheimer's disease, unspecified: Secondary | ICD-10-CM | POA: Diagnosis not present

## 2015-04-29 DIAGNOSIS — M1990 Unspecified osteoarthritis, unspecified site: Secondary | ICD-10-CM | POA: Diagnosis not present

## 2015-04-29 DIAGNOSIS — E119 Type 2 diabetes mellitus without complications: Secondary | ICD-10-CM | POA: Diagnosis not present

## 2015-04-30 DIAGNOSIS — M1990 Unspecified osteoarthritis, unspecified site: Secondary | ICD-10-CM | POA: Diagnosis not present

## 2015-04-30 DIAGNOSIS — G309 Alzheimer's disease, unspecified: Secondary | ICD-10-CM | POA: Diagnosis not present

## 2015-04-30 DIAGNOSIS — E119 Type 2 diabetes mellitus without complications: Secondary | ICD-10-CM | POA: Diagnosis not present

## 2015-04-30 DIAGNOSIS — F0280 Dementia in other diseases classified elsewhere without behavioral disturbance: Secondary | ICD-10-CM | POA: Diagnosis not present

## 2015-05-01 DIAGNOSIS — M1990 Unspecified osteoarthritis, unspecified site: Secondary | ICD-10-CM | POA: Diagnosis not present

## 2015-05-01 DIAGNOSIS — E119 Type 2 diabetes mellitus without complications: Secondary | ICD-10-CM | POA: Diagnosis not present

## 2015-05-01 DIAGNOSIS — G309 Alzheimer's disease, unspecified: Secondary | ICD-10-CM | POA: Diagnosis not present

## 2015-05-01 DIAGNOSIS — F0280 Dementia in other diseases classified elsewhere without behavioral disturbance: Secondary | ICD-10-CM | POA: Diagnosis not present

## 2015-05-02 DIAGNOSIS — G309 Alzheimer's disease, unspecified: Secondary | ICD-10-CM | POA: Diagnosis not present

## 2015-05-02 DIAGNOSIS — M1990 Unspecified osteoarthritis, unspecified site: Secondary | ICD-10-CM | POA: Diagnosis not present

## 2015-05-02 DIAGNOSIS — E119 Type 2 diabetes mellitus without complications: Secondary | ICD-10-CM | POA: Diagnosis not present

## 2015-05-02 DIAGNOSIS — F0280 Dementia in other diseases classified elsewhere without behavioral disturbance: Secondary | ICD-10-CM | POA: Diagnosis not present

## 2015-05-03 DIAGNOSIS — E119 Type 2 diabetes mellitus without complications: Secondary | ICD-10-CM | POA: Diagnosis not present

## 2015-05-03 DIAGNOSIS — M1990 Unspecified osteoarthritis, unspecified site: Secondary | ICD-10-CM | POA: Diagnosis not present

## 2015-05-03 DIAGNOSIS — G309 Alzheimer's disease, unspecified: Secondary | ICD-10-CM | POA: Diagnosis not present

## 2015-05-03 DIAGNOSIS — F0280 Dementia in other diseases classified elsewhere without behavioral disturbance: Secondary | ICD-10-CM | POA: Diagnosis not present

## 2015-05-04 DIAGNOSIS — F0280 Dementia in other diseases classified elsewhere without behavioral disturbance: Secondary | ICD-10-CM | POA: Diagnosis not present

## 2015-05-04 DIAGNOSIS — M1990 Unspecified osteoarthritis, unspecified site: Secondary | ICD-10-CM | POA: Diagnosis not present

## 2015-05-04 DIAGNOSIS — G309 Alzheimer's disease, unspecified: Secondary | ICD-10-CM | POA: Diagnosis not present

## 2015-05-04 DIAGNOSIS — E119 Type 2 diabetes mellitus without complications: Secondary | ICD-10-CM | POA: Diagnosis not present

## 2015-05-05 DIAGNOSIS — G309 Alzheimer's disease, unspecified: Secondary | ICD-10-CM | POA: Diagnosis not present

## 2015-05-05 DIAGNOSIS — F0280 Dementia in other diseases classified elsewhere without behavioral disturbance: Secondary | ICD-10-CM | POA: Diagnosis not present

## 2015-05-05 DIAGNOSIS — M1990 Unspecified osteoarthritis, unspecified site: Secondary | ICD-10-CM | POA: Diagnosis not present

## 2015-05-05 DIAGNOSIS — E119 Type 2 diabetes mellitus without complications: Secondary | ICD-10-CM | POA: Diagnosis not present

## 2015-05-06 DIAGNOSIS — E119 Type 2 diabetes mellitus without complications: Secondary | ICD-10-CM | POA: Diagnosis not present

## 2015-05-06 DIAGNOSIS — M1990 Unspecified osteoarthritis, unspecified site: Secondary | ICD-10-CM | POA: Diagnosis not present

## 2015-05-06 DIAGNOSIS — G309 Alzheimer's disease, unspecified: Secondary | ICD-10-CM | POA: Diagnosis not present

## 2015-05-06 DIAGNOSIS — F0280 Dementia in other diseases classified elsewhere without behavioral disturbance: Secondary | ICD-10-CM | POA: Diagnosis not present

## 2015-05-07 DIAGNOSIS — G309 Alzheimer's disease, unspecified: Secondary | ICD-10-CM | POA: Diagnosis not present

## 2015-05-07 DIAGNOSIS — E119 Type 2 diabetes mellitus without complications: Secondary | ICD-10-CM | POA: Diagnosis not present

## 2015-05-07 DIAGNOSIS — M1990 Unspecified osteoarthritis, unspecified site: Secondary | ICD-10-CM | POA: Diagnosis not present

## 2015-05-07 DIAGNOSIS — F0280 Dementia in other diseases classified elsewhere without behavioral disturbance: Secondary | ICD-10-CM | POA: Diagnosis not present

## 2015-05-10 DIAGNOSIS — G309 Alzheimer's disease, unspecified: Secondary | ICD-10-CM | POA: Diagnosis not present

## 2015-05-10 DIAGNOSIS — E119 Type 2 diabetes mellitus without complications: Secondary | ICD-10-CM | POA: Diagnosis not present

## 2015-05-10 DIAGNOSIS — M1990 Unspecified osteoarthritis, unspecified site: Secondary | ICD-10-CM | POA: Diagnosis not present

## 2015-05-10 DIAGNOSIS — F0280 Dementia in other diseases classified elsewhere without behavioral disturbance: Secondary | ICD-10-CM | POA: Diagnosis not present

## 2015-05-11 DIAGNOSIS — M1990 Unspecified osteoarthritis, unspecified site: Secondary | ICD-10-CM | POA: Diagnosis not present

## 2015-05-11 DIAGNOSIS — G309 Alzheimer's disease, unspecified: Secondary | ICD-10-CM | POA: Diagnosis not present

## 2015-05-11 DIAGNOSIS — F0280 Dementia in other diseases classified elsewhere without behavioral disturbance: Secondary | ICD-10-CM | POA: Diagnosis not present

## 2015-05-11 DIAGNOSIS — E119 Type 2 diabetes mellitus without complications: Secondary | ICD-10-CM | POA: Diagnosis not present

## 2015-05-12 DIAGNOSIS — E119 Type 2 diabetes mellitus without complications: Secondary | ICD-10-CM | POA: Diagnosis not present

## 2015-05-12 DIAGNOSIS — G309 Alzheimer's disease, unspecified: Secondary | ICD-10-CM | POA: Diagnosis not present

## 2015-05-12 DIAGNOSIS — M1990 Unspecified osteoarthritis, unspecified site: Secondary | ICD-10-CM | POA: Diagnosis not present

## 2015-05-12 DIAGNOSIS — F0280 Dementia in other diseases classified elsewhere without behavioral disturbance: Secondary | ICD-10-CM | POA: Diagnosis not present

## 2015-05-13 DIAGNOSIS — F0280 Dementia in other diseases classified elsewhere without behavioral disturbance: Secondary | ICD-10-CM | POA: Diagnosis not present

## 2015-05-13 DIAGNOSIS — M1990 Unspecified osteoarthritis, unspecified site: Secondary | ICD-10-CM | POA: Diagnosis not present

## 2015-05-13 DIAGNOSIS — E119 Type 2 diabetes mellitus without complications: Secondary | ICD-10-CM | POA: Diagnosis not present

## 2015-05-13 DIAGNOSIS — G309 Alzheimer's disease, unspecified: Secondary | ICD-10-CM | POA: Diagnosis not present

## 2015-05-14 DIAGNOSIS — G309 Alzheimer's disease, unspecified: Secondary | ICD-10-CM | POA: Diagnosis not present

## 2015-05-14 DIAGNOSIS — F0280 Dementia in other diseases classified elsewhere without behavioral disturbance: Secondary | ICD-10-CM | POA: Diagnosis not present

## 2015-05-14 DIAGNOSIS — E119 Type 2 diabetes mellitus without complications: Secondary | ICD-10-CM | POA: Diagnosis not present

## 2015-05-14 DIAGNOSIS — M1990 Unspecified osteoarthritis, unspecified site: Secondary | ICD-10-CM | POA: Diagnosis not present

## 2015-05-17 DIAGNOSIS — F0280 Dementia in other diseases classified elsewhere without behavioral disturbance: Secondary | ICD-10-CM | POA: Diagnosis not present

## 2015-05-17 DIAGNOSIS — M1990 Unspecified osteoarthritis, unspecified site: Secondary | ICD-10-CM | POA: Diagnosis not present

## 2015-05-17 DIAGNOSIS — G309 Alzheimer's disease, unspecified: Secondary | ICD-10-CM | POA: Diagnosis not present

## 2015-05-17 DIAGNOSIS — E119 Type 2 diabetes mellitus without complications: Secondary | ICD-10-CM | POA: Diagnosis not present

## 2015-05-18 DIAGNOSIS — M1990 Unspecified osteoarthritis, unspecified site: Secondary | ICD-10-CM | POA: Diagnosis not present

## 2015-05-18 DIAGNOSIS — F0280 Dementia in other diseases classified elsewhere without behavioral disturbance: Secondary | ICD-10-CM | POA: Diagnosis not present

## 2015-05-18 DIAGNOSIS — E119 Type 2 diabetes mellitus without complications: Secondary | ICD-10-CM | POA: Diagnosis not present

## 2015-05-18 DIAGNOSIS — G309 Alzheimer's disease, unspecified: Secondary | ICD-10-CM | POA: Diagnosis not present

## 2015-05-19 DIAGNOSIS — G309 Alzheimer's disease, unspecified: Secondary | ICD-10-CM | POA: Diagnosis not present

## 2015-05-19 DIAGNOSIS — F0280 Dementia in other diseases classified elsewhere without behavioral disturbance: Secondary | ICD-10-CM | POA: Diagnosis not present

## 2015-05-19 DIAGNOSIS — M1990 Unspecified osteoarthritis, unspecified site: Secondary | ICD-10-CM | POA: Diagnosis not present

## 2015-05-19 DIAGNOSIS — E119 Type 2 diabetes mellitus without complications: Secondary | ICD-10-CM | POA: Diagnosis not present

## 2015-05-20 DIAGNOSIS — M1990 Unspecified osteoarthritis, unspecified site: Secondary | ICD-10-CM | POA: Diagnosis not present

## 2015-05-20 DIAGNOSIS — E119 Type 2 diabetes mellitus without complications: Secondary | ICD-10-CM | POA: Diagnosis not present

## 2015-05-20 DIAGNOSIS — G309 Alzheimer's disease, unspecified: Secondary | ICD-10-CM | POA: Diagnosis not present

## 2015-05-20 DIAGNOSIS — F0280 Dementia in other diseases classified elsewhere without behavioral disturbance: Secondary | ICD-10-CM | POA: Diagnosis not present

## 2015-05-21 DIAGNOSIS — F0280 Dementia in other diseases classified elsewhere without behavioral disturbance: Secondary | ICD-10-CM | POA: Diagnosis not present

## 2015-05-21 DIAGNOSIS — E119 Type 2 diabetes mellitus without complications: Secondary | ICD-10-CM | POA: Diagnosis not present

## 2015-05-21 DIAGNOSIS — M1990 Unspecified osteoarthritis, unspecified site: Secondary | ICD-10-CM | POA: Diagnosis not present

## 2015-05-21 DIAGNOSIS — G309 Alzheimer's disease, unspecified: Secondary | ICD-10-CM | POA: Diagnosis not present

## 2015-05-23 DIAGNOSIS — M170 Bilateral primary osteoarthritis of knee: Secondary | ICD-10-CM | POA: Diagnosis not present

## 2015-05-23 DIAGNOSIS — C50919 Malignant neoplasm of unspecified site of unspecified female breast: Secondary | ICD-10-CM | POA: Diagnosis not present

## 2015-05-23 DIAGNOSIS — F0280 Dementia in other diseases classified elsewhere without behavioral disturbance: Secondary | ICD-10-CM | POA: Diagnosis not present

## 2015-05-23 DIAGNOSIS — G301 Alzheimer's disease with late onset: Secondary | ICD-10-CM | POA: Diagnosis not present

## 2015-05-23 DIAGNOSIS — E0865 Diabetes mellitus due to underlying condition with hyperglycemia: Secondary | ICD-10-CM | POA: Diagnosis not present

## 2015-05-24 DIAGNOSIS — C50919 Malignant neoplasm of unspecified site of unspecified female breast: Secondary | ICD-10-CM | POA: Diagnosis not present

## 2015-05-24 DIAGNOSIS — M170 Bilateral primary osteoarthritis of knee: Secondary | ICD-10-CM | POA: Diagnosis not present

## 2015-05-24 DIAGNOSIS — E0865 Diabetes mellitus due to underlying condition with hyperglycemia: Secondary | ICD-10-CM | POA: Diagnosis not present

## 2015-05-24 DIAGNOSIS — F0280 Dementia in other diseases classified elsewhere without behavioral disturbance: Secondary | ICD-10-CM | POA: Diagnosis not present

## 2015-05-24 DIAGNOSIS — G301 Alzheimer's disease with late onset: Secondary | ICD-10-CM | POA: Diagnosis not present

## 2015-05-25 DIAGNOSIS — C50919 Malignant neoplasm of unspecified site of unspecified female breast: Secondary | ICD-10-CM | POA: Diagnosis not present

## 2015-05-25 DIAGNOSIS — E0865 Diabetes mellitus due to underlying condition with hyperglycemia: Secondary | ICD-10-CM | POA: Diagnosis not present

## 2015-05-25 DIAGNOSIS — F0280 Dementia in other diseases classified elsewhere without behavioral disturbance: Secondary | ICD-10-CM | POA: Diagnosis not present

## 2015-05-25 DIAGNOSIS — M170 Bilateral primary osteoarthritis of knee: Secondary | ICD-10-CM | POA: Diagnosis not present

## 2015-05-25 DIAGNOSIS — G301 Alzheimer's disease with late onset: Secondary | ICD-10-CM | POA: Diagnosis not present

## 2015-05-26 DIAGNOSIS — F0280 Dementia in other diseases classified elsewhere without behavioral disturbance: Secondary | ICD-10-CM | POA: Diagnosis not present

## 2015-05-26 DIAGNOSIS — M170 Bilateral primary osteoarthritis of knee: Secondary | ICD-10-CM | POA: Diagnosis not present

## 2015-05-26 DIAGNOSIS — C50919 Malignant neoplasm of unspecified site of unspecified female breast: Secondary | ICD-10-CM | POA: Diagnosis not present

## 2015-05-26 DIAGNOSIS — G301 Alzheimer's disease with late onset: Secondary | ICD-10-CM | POA: Diagnosis not present

## 2015-05-26 DIAGNOSIS — E0865 Diabetes mellitus due to underlying condition with hyperglycemia: Secondary | ICD-10-CM | POA: Diagnosis not present

## 2015-05-27 ENCOUNTER — Encounter: Payer: Self-pay | Admitting: Internal Medicine

## 2015-05-27 ENCOUNTER — Non-Acute Institutional Stay: Payer: Medicare Other | Admitting: Internal Medicine

## 2015-05-27 VITALS — BP 138/84 | HR 64 | Temp 98.1°F | Resp 18

## 2015-05-27 DIAGNOSIS — E0865 Diabetes mellitus due to underlying condition with hyperglycemia: Secondary | ICD-10-CM | POA: Diagnosis not present

## 2015-05-27 DIAGNOSIS — F0280 Dementia in other diseases classified elsewhere without behavioral disturbance: Secondary | ICD-10-CM | POA: Diagnosis not present

## 2015-05-27 DIAGNOSIS — E119 Type 2 diabetes mellitus without complications: Secondary | ICD-10-CM | POA: Diagnosis not present

## 2015-05-27 DIAGNOSIS — E43 Unspecified severe protein-calorie malnutrition: Secondary | ICD-10-CM

## 2015-05-27 DIAGNOSIS — M170 Bilateral primary osteoarthritis of knee: Secondary | ICD-10-CM

## 2015-05-27 DIAGNOSIS — G301 Alzheimer's disease with late onset: Secondary | ICD-10-CM

## 2015-05-27 DIAGNOSIS — C50919 Malignant neoplasm of unspecified site of unspecified female breast: Secondary | ICD-10-CM | POA: Diagnosis not present

## 2015-05-27 NOTE — Assessment & Plan Note (Signed)
Now clenching teeth and refusing food at times

## 2015-05-27 NOTE — Assessment & Plan Note (Addendum)
End stage Hard to believe she is still alive Appropriate for hospice care still

## 2015-05-27 NOTE — Progress Notes (Signed)
Subjective:    Patient ID: Tanya Clayton, female    DOB: 06-Jan-1935, 80 y.o.   MRN: [id number]  HPI Visit for follow up of severe dementia Reviewed status with [facility] coordinator Son Jeneen Rinks is here  Was discharged from [facility] for "stability" But she is totally end stage and hospice appropriate Now on [facility]  Not eating as well Clenches her teeth and won't accept food at times Not clear if she has lost more weight  Ongoing concern for pain control She grits her teeth at times  No SOB No apparent chest pain  Sugars okay Checked weekly  -- 110-130 generally  Current Outpatient Prescriptions on File Prior to Visit  Medication Sig Dispense Refill  . acetaminophen (TYLENOL) 650 MG suppository Place 650 mg rectally every 4 (four) hours as needed.    . Alcohol Swabs (B-D SINGLE USE SWABS REGULAR) PADS FINGERSTICK BLOOD SUGAR TEST ONCE DAILY. 100 each 11  . bisacodyl (DULCOLAX) 10 MG suppository Place 10 mg rectally every 3 (three) days as needed for constipation.    . fentaNYL (DURAGESIC - DOSED MCG/HR) 25 MCG/HR patch Place 25 mcg onto the skin every 3 (three) days.    Marland Kitchen glucose blood test strip True Track test strips; pt test blood sugar twice daily and as directed.250.00 100 each 3  . ibuprofen (ADVIL,MOTRIN) 400 MG tablet Take 400 mg by mouth 3 (three) times daily.    . Lancets Misc. (UNISTIK 3 COMFORT) MISC Fingerstick blood sugar test once daily 100 each 1  . metFORMIN (GLUCOPHAGE) 500 MG tablet Take 500 mg by mouth daily with breakfast.    . MONOJECT INS SYR .5CC/30G 30G X 5/16" 0.5 ML MISC USE AS DIRECTED 100 each PRN  . polyethylene glycol (MIRALAX / GLYCOLAX) packet Take 17 g by mouth every other day.     . Skin Protectants, Misc. (ENDIT EX) Apply topically 4 (four) times daily as needed (until open area resolves).    . traMADol (ULTRAM) 50 MG tablet Take 50 mg by mouth every 4 (four) hours as needed.    . triamcinolone lotion (KENALOG) 0.1 %  Apply 1 application topically 3 (three) times daily as needed.     No current facility-administered medications on file prior to visit.    No Known Allergies  Past Medical History  Diagnosis Date  . Alzheimer's disease 2006  . Breast cancer ([place])     Dr Grayland Ormond  . Allergy   . Arthritis   . Diabetes mellitus   . Sacral decubitus ulcer, stage II 02/2013    Past Surgical History  Procedure Laterality Date  . Mastectomy  2007    Right--with implant  . Abdominal hysterectomy  1960's    Family History  Problem Relation Age of Onset  . Diabetes Mother     Social History   Social History  . Marital Status: Widowed    Spouse Name: N/A  . Number of Children: 2  . Years of Education: N/A   Occupational History  . Schoolteacher     retired   Social History Main Topics  . Smoking status: Never Smoker   . Smokeless tobacco: Never Used  . Alcohol Use: No  . Drug Use: Not on file  . Sexual Activity: Not on file   Other Topics Concern  . Not on file   Social History Narrative   1 son, 1 step daughter   Has DNR--done 10/10/12         Review of  Systems Sleeps much of the time--in spurts Bowels okay -only getting the miralax every other day now Voids okay    Objective:   Physical Exam  Constitutional:  Same generalized wasting smiles  Neck: No thyromegaly present.  Cardiovascular: Normal rate, regular rhythm and normal heart sounds.  Exam reveals no gallop.   No murmur heard. Pulmonary/Chest: Effort normal and breath sounds normal. No respiratory distress. She has no wheezes. She has no rales.  Abdominal: Soft. There is no tenderness.  Musculoskeletal: She exhibits no edema.  Legs contracted--- seems to have pain with passive ROM  Lymphadenopathy:    She has no cervical adenopathy.  Skin: No rash noted.          Assessment & Plan:

## 2015-05-27 NOTE — Assessment & Plan Note (Signed)
Sugars okay on low dose metformin

## 2015-05-27 NOTE — Assessment & Plan Note (Signed)
Seems to have more pain Will add regular tramadol and consider adding 12 mcg fentanyl patch

## 2015-05-28 DIAGNOSIS — F0280 Dementia in other diseases classified elsewhere without behavioral disturbance: Secondary | ICD-10-CM | POA: Diagnosis not present

## 2015-05-28 DIAGNOSIS — M170 Bilateral primary osteoarthritis of knee: Secondary | ICD-10-CM | POA: Diagnosis not present

## 2015-05-28 DIAGNOSIS — C50919 Malignant neoplasm of unspecified site of unspecified female breast: Secondary | ICD-10-CM | POA: Diagnosis not present

## 2015-05-28 DIAGNOSIS — E0865 Diabetes mellitus due to underlying condition with hyperglycemia: Secondary | ICD-10-CM | POA: Diagnosis not present

## 2015-05-28 DIAGNOSIS — G301 Alzheimer's disease with late onset: Secondary | ICD-10-CM | POA: Diagnosis not present

## 2015-06-01 DIAGNOSIS — M170 Bilateral primary osteoarthritis of knee: Secondary | ICD-10-CM | POA: Diagnosis not present

## 2015-06-01 DIAGNOSIS — G301 Alzheimer's disease with late onset: Secondary | ICD-10-CM | POA: Diagnosis not present

## 2015-06-01 DIAGNOSIS — E0865 Diabetes mellitus due to underlying condition with hyperglycemia: Secondary | ICD-10-CM | POA: Diagnosis not present

## 2015-06-01 DIAGNOSIS — F0280 Dementia in other diseases classified elsewhere without behavioral disturbance: Secondary | ICD-10-CM | POA: Diagnosis not present

## 2015-06-01 DIAGNOSIS — C50919 Malignant neoplasm of unspecified site of unspecified female breast: Secondary | ICD-10-CM | POA: Diagnosis not present

## 2015-06-02 DIAGNOSIS — G301 Alzheimer's disease with late onset: Secondary | ICD-10-CM | POA: Diagnosis not present

## 2015-06-02 DIAGNOSIS — C50919 Malignant neoplasm of unspecified site of unspecified female breast: Secondary | ICD-10-CM | POA: Diagnosis not present

## 2015-06-02 DIAGNOSIS — E0865 Diabetes mellitus due to underlying condition with hyperglycemia: Secondary | ICD-10-CM | POA: Diagnosis not present

## 2015-06-02 DIAGNOSIS — M170 Bilateral primary osteoarthritis of knee: Secondary | ICD-10-CM | POA: Diagnosis not present

## 2015-06-02 DIAGNOSIS — F0280 Dementia in other diseases classified elsewhere without behavioral disturbance: Secondary | ICD-10-CM | POA: Diagnosis not present

## 2015-06-03 DIAGNOSIS — E0865 Diabetes mellitus due to underlying condition with hyperglycemia: Secondary | ICD-10-CM | POA: Diagnosis not present

## 2015-06-03 DIAGNOSIS — M170 Bilateral primary osteoarthritis of knee: Secondary | ICD-10-CM | POA: Diagnosis not present

## 2015-06-03 DIAGNOSIS — F0280 Dementia in other diseases classified elsewhere without behavioral disturbance: Secondary | ICD-10-CM | POA: Diagnosis not present

## 2015-06-03 DIAGNOSIS — C50919 Malignant neoplasm of unspecified site of unspecified female breast: Secondary | ICD-10-CM | POA: Diagnosis not present

## 2015-06-03 DIAGNOSIS — G301 Alzheimer's disease with late onset: Secondary | ICD-10-CM | POA: Diagnosis not present

## 2015-06-04 DIAGNOSIS — F0280 Dementia in other diseases classified elsewhere without behavioral disturbance: Secondary | ICD-10-CM | POA: Diagnosis not present

## 2015-06-04 DIAGNOSIS — C50919 Malignant neoplasm of unspecified site of unspecified female breast: Secondary | ICD-10-CM | POA: Diagnosis not present

## 2015-06-04 DIAGNOSIS — G301 Alzheimer's disease with late onset: Secondary | ICD-10-CM | POA: Diagnosis not present

## 2015-06-04 DIAGNOSIS — E0865 Diabetes mellitus due to underlying condition with hyperglycemia: Secondary | ICD-10-CM | POA: Diagnosis not present

## 2015-06-04 DIAGNOSIS — M170 Bilateral primary osteoarthritis of knee: Secondary | ICD-10-CM | POA: Diagnosis not present

## 2015-06-05 DIAGNOSIS — M170 Bilateral primary osteoarthritis of knee: Secondary | ICD-10-CM | POA: Diagnosis not present

## 2015-06-05 DIAGNOSIS — F0280 Dementia in other diseases classified elsewhere without behavioral disturbance: Secondary | ICD-10-CM | POA: Diagnosis not present

## 2015-06-05 DIAGNOSIS — C50919 Malignant neoplasm of unspecified site of unspecified female breast: Secondary | ICD-10-CM | POA: Diagnosis not present

## 2015-06-05 DIAGNOSIS — G301 Alzheimer's disease with late onset: Secondary | ICD-10-CM | POA: Diagnosis not present

## 2015-06-05 DIAGNOSIS — E0865 Diabetes mellitus due to underlying condition with hyperglycemia: Secondary | ICD-10-CM | POA: Diagnosis not present

## 2015-06-07 DIAGNOSIS — G301 Alzheimer's disease with late onset: Secondary | ICD-10-CM | POA: Diagnosis not present

## 2015-06-07 DIAGNOSIS — M170 Bilateral primary osteoarthritis of knee: Secondary | ICD-10-CM | POA: Diagnosis not present

## 2015-06-07 DIAGNOSIS — E0865 Diabetes mellitus due to underlying condition with hyperglycemia: Secondary | ICD-10-CM | POA: Diagnosis not present

## 2015-06-07 DIAGNOSIS — C50919 Malignant neoplasm of unspecified site of unspecified female breast: Secondary | ICD-10-CM | POA: Diagnosis not present

## 2015-06-07 DIAGNOSIS — F0280 Dementia in other diseases classified elsewhere without behavioral disturbance: Secondary | ICD-10-CM | POA: Diagnosis not present

## 2015-06-08 DIAGNOSIS — F0280 Dementia in other diseases classified elsewhere without behavioral disturbance: Secondary | ICD-10-CM | POA: Diagnosis not present

## 2015-06-08 DIAGNOSIS — G301 Alzheimer's disease with late onset: Secondary | ICD-10-CM | POA: Diagnosis not present

## 2015-06-08 DIAGNOSIS — E0865 Diabetes mellitus due to underlying condition with hyperglycemia: Secondary | ICD-10-CM | POA: Diagnosis not present

## 2015-06-08 DIAGNOSIS — C50919 Malignant neoplasm of unspecified site of unspecified female breast: Secondary | ICD-10-CM | POA: Diagnosis not present

## 2015-06-08 DIAGNOSIS — M170 Bilateral primary osteoarthritis of knee: Secondary | ICD-10-CM | POA: Diagnosis not present

## 2015-06-09 DIAGNOSIS — F0280 Dementia in other diseases classified elsewhere without behavioral disturbance: Secondary | ICD-10-CM | POA: Diagnosis not present

## 2015-06-09 DIAGNOSIS — M170 Bilateral primary osteoarthritis of knee: Secondary | ICD-10-CM | POA: Diagnosis not present

## 2015-06-09 DIAGNOSIS — C50919 Malignant neoplasm of unspecified site of unspecified female breast: Secondary | ICD-10-CM | POA: Diagnosis not present

## 2015-06-09 DIAGNOSIS — G301 Alzheimer's disease with late onset: Secondary | ICD-10-CM | POA: Diagnosis not present

## 2015-06-09 DIAGNOSIS — E0865 Diabetes mellitus due to underlying condition with hyperglycemia: Secondary | ICD-10-CM | POA: Diagnosis not present

## 2015-06-10 DIAGNOSIS — M170 Bilateral primary osteoarthritis of knee: Secondary | ICD-10-CM | POA: Diagnosis not present

## 2015-06-10 DIAGNOSIS — C50919 Malignant neoplasm of unspecified site of unspecified female breast: Secondary | ICD-10-CM | POA: Diagnosis not present

## 2015-06-10 DIAGNOSIS — G301 Alzheimer's disease with late onset: Secondary | ICD-10-CM | POA: Diagnosis not present

## 2015-06-10 DIAGNOSIS — F0280 Dementia in other diseases classified elsewhere without behavioral disturbance: Secondary | ICD-10-CM | POA: Diagnosis not present

## 2015-06-10 DIAGNOSIS — E0865 Diabetes mellitus due to underlying condition with hyperglycemia: Secondary | ICD-10-CM | POA: Diagnosis not present

## 2015-06-11 DIAGNOSIS — C50919 Malignant neoplasm of unspecified site of unspecified female breast: Secondary | ICD-10-CM | POA: Diagnosis not present

## 2015-06-11 DIAGNOSIS — E0865 Diabetes mellitus due to underlying condition with hyperglycemia: Secondary | ICD-10-CM | POA: Diagnosis not present

## 2015-06-11 DIAGNOSIS — G301 Alzheimer's disease with late onset: Secondary | ICD-10-CM | POA: Diagnosis not present

## 2015-06-11 DIAGNOSIS — F0280 Dementia in other diseases classified elsewhere without behavioral disturbance: Secondary | ICD-10-CM | POA: Diagnosis not present

## 2015-06-11 DIAGNOSIS — M170 Bilateral primary osteoarthritis of knee: Secondary | ICD-10-CM | POA: Diagnosis not present

## 2015-06-14 DIAGNOSIS — C50919 Malignant neoplasm of unspecified site of unspecified female breast: Secondary | ICD-10-CM | POA: Diagnosis not present

## 2015-06-14 DIAGNOSIS — E0865 Diabetes mellitus due to underlying condition with hyperglycemia: Secondary | ICD-10-CM | POA: Diagnosis not present

## 2015-06-14 DIAGNOSIS — F0280 Dementia in other diseases classified elsewhere without behavioral disturbance: Secondary | ICD-10-CM | POA: Diagnosis not present

## 2015-06-14 DIAGNOSIS — G301 Alzheimer's disease with late onset: Secondary | ICD-10-CM | POA: Diagnosis not present

## 2015-06-14 DIAGNOSIS — M170 Bilateral primary osteoarthritis of knee: Secondary | ICD-10-CM | POA: Diagnosis not present

## 2015-06-15 DIAGNOSIS — F0280 Dementia in other diseases classified elsewhere without behavioral disturbance: Secondary | ICD-10-CM | POA: Diagnosis not present

## 2015-06-15 DIAGNOSIS — G301 Alzheimer's disease with late onset: Secondary | ICD-10-CM | POA: Diagnosis not present

## 2015-06-15 DIAGNOSIS — M170 Bilateral primary osteoarthritis of knee: Secondary | ICD-10-CM | POA: Diagnosis not present

## 2015-06-15 DIAGNOSIS — E0865 Diabetes mellitus due to underlying condition with hyperglycemia: Secondary | ICD-10-CM | POA: Diagnosis not present

## 2015-06-15 DIAGNOSIS — C50919 Malignant neoplasm of unspecified site of unspecified female breast: Secondary | ICD-10-CM | POA: Diagnosis not present

## 2015-06-16 DIAGNOSIS — C50919 Malignant neoplasm of unspecified site of unspecified female breast: Secondary | ICD-10-CM | POA: Diagnosis not present

## 2015-06-16 DIAGNOSIS — E0865 Diabetes mellitus due to underlying condition with hyperglycemia: Secondary | ICD-10-CM | POA: Diagnosis not present

## 2015-06-16 DIAGNOSIS — M170 Bilateral primary osteoarthritis of knee: Secondary | ICD-10-CM | POA: Diagnosis not present

## 2015-06-16 DIAGNOSIS — G301 Alzheimer's disease with late onset: Secondary | ICD-10-CM | POA: Diagnosis not present

## 2015-06-16 DIAGNOSIS — F0280 Dementia in other diseases classified elsewhere without behavioral disturbance: Secondary | ICD-10-CM | POA: Diagnosis not present

## 2015-06-17 DIAGNOSIS — F0280 Dementia in other diseases classified elsewhere without behavioral disturbance: Secondary | ICD-10-CM | POA: Diagnosis not present

## 2015-06-17 DIAGNOSIS — G301 Alzheimer's disease with late onset: Secondary | ICD-10-CM | POA: Diagnosis not present

## 2015-06-17 DIAGNOSIS — C50919 Malignant neoplasm of unspecified site of unspecified female breast: Secondary | ICD-10-CM | POA: Diagnosis not present

## 2015-06-17 DIAGNOSIS — E0865 Diabetes mellitus due to underlying condition with hyperglycemia: Secondary | ICD-10-CM | POA: Diagnosis not present

## 2015-06-17 DIAGNOSIS — M170 Bilateral primary osteoarthritis of knee: Secondary | ICD-10-CM | POA: Diagnosis not present

## 2015-06-18 DIAGNOSIS — G301 Alzheimer's disease with late onset: Secondary | ICD-10-CM | POA: Diagnosis not present

## 2015-06-18 DIAGNOSIS — F0280 Dementia in other diseases classified elsewhere without behavioral disturbance: Secondary | ICD-10-CM | POA: Diagnosis not present

## 2015-06-18 DIAGNOSIS — M170 Bilateral primary osteoarthritis of knee: Secondary | ICD-10-CM | POA: Diagnosis not present

## 2015-06-18 DIAGNOSIS — C50919 Malignant neoplasm of unspecified site of unspecified female breast: Secondary | ICD-10-CM | POA: Diagnosis not present

## 2015-06-18 DIAGNOSIS — E0865 Diabetes mellitus due to underlying condition with hyperglycemia: Secondary | ICD-10-CM | POA: Diagnosis not present

## 2015-06-21 DIAGNOSIS — M170 Bilateral primary osteoarthritis of knee: Secondary | ICD-10-CM | POA: Diagnosis not present

## 2015-06-21 DIAGNOSIS — G301 Alzheimer's disease with late onset: Secondary | ICD-10-CM | POA: Diagnosis not present

## 2015-06-21 DIAGNOSIS — E0865 Diabetes mellitus due to underlying condition with hyperglycemia: Secondary | ICD-10-CM | POA: Diagnosis not present

## 2015-06-21 DIAGNOSIS — C50919 Malignant neoplasm of unspecified site of unspecified female breast: Secondary | ICD-10-CM | POA: Diagnosis not present

## 2015-06-21 DIAGNOSIS — F0280 Dementia in other diseases classified elsewhere without behavioral disturbance: Secondary | ICD-10-CM | POA: Diagnosis not present

## 2015-06-22 DIAGNOSIS — E0865 Diabetes mellitus due to underlying condition with hyperglycemia: Secondary | ICD-10-CM | POA: Diagnosis not present

## 2015-06-22 DIAGNOSIS — C50919 Malignant neoplasm of unspecified site of unspecified female breast: Secondary | ICD-10-CM | POA: Diagnosis not present

## 2015-06-22 DIAGNOSIS — F0280 Dementia in other diseases classified elsewhere without behavioral disturbance: Secondary | ICD-10-CM | POA: Diagnosis not present

## 2015-06-22 DIAGNOSIS — G301 Alzheimer's disease with late onset: Secondary | ICD-10-CM | POA: Diagnosis not present

## 2015-06-22 DIAGNOSIS — M170 Bilateral primary osteoarthritis of knee: Secondary | ICD-10-CM | POA: Diagnosis not present

## 2015-06-23 DIAGNOSIS — M170 Bilateral primary osteoarthritis of knee: Secondary | ICD-10-CM | POA: Diagnosis not present

## 2015-06-23 DIAGNOSIS — F0280 Dementia in other diseases classified elsewhere without behavioral disturbance: Secondary | ICD-10-CM | POA: Diagnosis not present

## 2015-06-23 DIAGNOSIS — C50919 Malignant neoplasm of unspecified site of unspecified female breast: Secondary | ICD-10-CM | POA: Diagnosis not present

## 2015-06-23 DIAGNOSIS — G301 Alzheimer's disease with late onset: Secondary | ICD-10-CM | POA: Diagnosis not present

## 2015-06-23 DIAGNOSIS — E0865 Diabetes mellitus due to underlying condition with hyperglycemia: Secondary | ICD-10-CM | POA: Diagnosis not present

## 2015-06-24 DIAGNOSIS — M170 Bilateral primary osteoarthritis of knee: Secondary | ICD-10-CM | POA: Diagnosis not present

## 2015-06-24 DIAGNOSIS — F0280 Dementia in other diseases classified elsewhere without behavioral disturbance: Secondary | ICD-10-CM | POA: Diagnosis not present

## 2015-06-24 DIAGNOSIS — E0865 Diabetes mellitus due to underlying condition with hyperglycemia: Secondary | ICD-10-CM | POA: Diagnosis not present

## 2015-06-24 DIAGNOSIS — C50919 Malignant neoplasm of unspecified site of unspecified female breast: Secondary | ICD-10-CM | POA: Diagnosis not present

## 2015-06-24 DIAGNOSIS — G301 Alzheimer's disease with late onset: Secondary | ICD-10-CM | POA: Diagnosis not present

## 2015-06-25 DIAGNOSIS — C50919 Malignant neoplasm of unspecified site of unspecified female breast: Secondary | ICD-10-CM | POA: Diagnosis not present

## 2015-06-25 DIAGNOSIS — E0865 Diabetes mellitus due to underlying condition with hyperglycemia: Secondary | ICD-10-CM | POA: Diagnosis not present

## 2015-06-25 DIAGNOSIS — M170 Bilateral primary osteoarthritis of knee: Secondary | ICD-10-CM | POA: Diagnosis not present

## 2015-06-25 DIAGNOSIS — F0280 Dementia in other diseases classified elsewhere without behavioral disturbance: Secondary | ICD-10-CM | POA: Diagnosis not present

## 2015-06-25 DIAGNOSIS — G301 Alzheimer's disease with late onset: Secondary | ICD-10-CM | POA: Diagnosis not present

## 2015-06-28 DIAGNOSIS — E0865 Diabetes mellitus due to underlying condition with hyperglycemia: Secondary | ICD-10-CM | POA: Diagnosis not present

## 2015-06-28 DIAGNOSIS — M170 Bilateral primary osteoarthritis of knee: Secondary | ICD-10-CM | POA: Diagnosis not present

## 2015-06-28 DIAGNOSIS — C50919 Malignant neoplasm of unspecified site of unspecified female breast: Secondary | ICD-10-CM | POA: Diagnosis not present

## 2015-06-28 DIAGNOSIS — F0280 Dementia in other diseases classified elsewhere without behavioral disturbance: Secondary | ICD-10-CM | POA: Diagnosis not present

## 2015-06-28 DIAGNOSIS — G301 Alzheimer's disease with late onset: Secondary | ICD-10-CM | POA: Diagnosis not present

## 2015-06-29 DIAGNOSIS — G301 Alzheimer's disease with late onset: Secondary | ICD-10-CM | POA: Diagnosis not present

## 2015-06-29 DIAGNOSIS — C50919 Malignant neoplasm of unspecified site of unspecified female breast: Secondary | ICD-10-CM | POA: Diagnosis not present

## 2015-06-29 DIAGNOSIS — F0280 Dementia in other diseases classified elsewhere without behavioral disturbance: Secondary | ICD-10-CM | POA: Diagnosis not present

## 2015-06-29 DIAGNOSIS — E0865 Diabetes mellitus due to underlying condition with hyperglycemia: Secondary | ICD-10-CM | POA: Diagnosis not present

## 2015-06-29 DIAGNOSIS — M170 Bilateral primary osteoarthritis of knee: Secondary | ICD-10-CM | POA: Diagnosis not present

## 2015-06-30 DIAGNOSIS — C50919 Malignant neoplasm of unspecified site of unspecified female breast: Secondary | ICD-10-CM | POA: Diagnosis not present

## 2015-06-30 DIAGNOSIS — F0280 Dementia in other diseases classified elsewhere without behavioral disturbance: Secondary | ICD-10-CM | POA: Diagnosis not present

## 2015-06-30 DIAGNOSIS — E0865 Diabetes mellitus due to underlying condition with hyperglycemia: Secondary | ICD-10-CM | POA: Diagnosis not present

## 2015-06-30 DIAGNOSIS — G301 Alzheimer's disease with late onset: Secondary | ICD-10-CM | POA: Diagnosis not present

## 2015-06-30 DIAGNOSIS — M170 Bilateral primary osteoarthritis of knee: Secondary | ICD-10-CM | POA: Diagnosis not present

## 2015-07-01 DIAGNOSIS — E0865 Diabetes mellitus due to underlying condition with hyperglycemia: Secondary | ICD-10-CM | POA: Diagnosis not present

## 2015-07-01 DIAGNOSIS — F0280 Dementia in other diseases classified elsewhere without behavioral disturbance: Secondary | ICD-10-CM | POA: Diagnosis not present

## 2015-07-01 DIAGNOSIS — G301 Alzheimer's disease with late onset: Secondary | ICD-10-CM | POA: Diagnosis not present

## 2015-07-01 DIAGNOSIS — C50919 Malignant neoplasm of unspecified site of unspecified female breast: Secondary | ICD-10-CM | POA: Diagnosis not present

## 2015-07-01 DIAGNOSIS — M170 Bilateral primary osteoarthritis of knee: Secondary | ICD-10-CM | POA: Diagnosis not present

## 2015-07-02 DIAGNOSIS — E0865 Diabetes mellitus due to underlying condition with hyperglycemia: Secondary | ICD-10-CM | POA: Diagnosis not present

## 2015-07-02 DIAGNOSIS — F0280 Dementia in other diseases classified elsewhere without behavioral disturbance: Secondary | ICD-10-CM | POA: Diagnosis not present

## 2015-07-02 DIAGNOSIS — G301 Alzheimer's disease with late onset: Secondary | ICD-10-CM | POA: Diagnosis not present

## 2015-07-02 DIAGNOSIS — M170 Bilateral primary osteoarthritis of knee: Secondary | ICD-10-CM | POA: Diagnosis not present

## 2015-07-02 DIAGNOSIS — C50919 Malignant neoplasm of unspecified site of unspecified female breast: Secondary | ICD-10-CM | POA: Diagnosis not present

## 2015-07-03 DIAGNOSIS — G301 Alzheimer's disease with late onset: Secondary | ICD-10-CM | POA: Diagnosis not present

## 2015-07-03 DIAGNOSIS — C50919 Malignant neoplasm of unspecified site of unspecified female breast: Secondary | ICD-10-CM | POA: Diagnosis not present

## 2015-07-03 DIAGNOSIS — E0865 Diabetes mellitus due to underlying condition with hyperglycemia: Secondary | ICD-10-CM | POA: Diagnosis not present

## 2015-07-03 DIAGNOSIS — F0280 Dementia in other diseases classified elsewhere without behavioral disturbance: Secondary | ICD-10-CM | POA: Diagnosis not present

## 2015-07-03 DIAGNOSIS — M170 Bilateral primary osteoarthritis of knee: Secondary | ICD-10-CM | POA: Diagnosis not present

## 2015-07-05 DIAGNOSIS — C50919 Malignant neoplasm of unspecified site of unspecified female breast: Secondary | ICD-10-CM | POA: Diagnosis not present

## 2015-07-05 DIAGNOSIS — F0280 Dementia in other diseases classified elsewhere without behavioral disturbance: Secondary | ICD-10-CM | POA: Diagnosis not present

## 2015-07-05 DIAGNOSIS — E0865 Diabetes mellitus due to underlying condition with hyperglycemia: Secondary | ICD-10-CM | POA: Diagnosis not present

## 2015-07-05 DIAGNOSIS — G301 Alzheimer's disease with late onset: Secondary | ICD-10-CM | POA: Diagnosis not present

## 2015-07-05 DIAGNOSIS — M170 Bilateral primary osteoarthritis of knee: Secondary | ICD-10-CM | POA: Diagnosis not present

## 2015-07-07 DIAGNOSIS — E0865 Diabetes mellitus due to underlying condition with hyperglycemia: Secondary | ICD-10-CM | POA: Diagnosis not present

## 2015-07-07 DIAGNOSIS — G301 Alzheimer's disease with late onset: Secondary | ICD-10-CM | POA: Diagnosis not present

## 2015-07-07 DIAGNOSIS — M170 Bilateral primary osteoarthritis of knee: Secondary | ICD-10-CM | POA: Diagnosis not present

## 2015-07-07 DIAGNOSIS — F0280 Dementia in other diseases classified elsewhere without behavioral disturbance: Secondary | ICD-10-CM | POA: Diagnosis not present

## 2015-07-07 DIAGNOSIS — C50919 Malignant neoplasm of unspecified site of unspecified female breast: Secondary | ICD-10-CM | POA: Diagnosis not present

## 2015-07-08 DIAGNOSIS — M170 Bilateral primary osteoarthritis of knee: Secondary | ICD-10-CM | POA: Diagnosis not present

## 2015-07-08 DIAGNOSIS — F0280 Dementia in other diseases classified elsewhere without behavioral disturbance: Secondary | ICD-10-CM | POA: Diagnosis not present

## 2015-07-08 DIAGNOSIS — G301 Alzheimer's disease with late onset: Secondary | ICD-10-CM | POA: Diagnosis not present

## 2015-07-08 DIAGNOSIS — E0865 Diabetes mellitus due to underlying condition with hyperglycemia: Secondary | ICD-10-CM | POA: Diagnosis not present

## 2015-07-08 DIAGNOSIS — C50919 Malignant neoplasm of unspecified site of unspecified female breast: Secondary | ICD-10-CM | POA: Diagnosis not present

## 2015-07-09 DIAGNOSIS — G301 Alzheimer's disease with late onset: Secondary | ICD-10-CM | POA: Diagnosis not present

## 2015-07-09 DIAGNOSIS — C50919 Malignant neoplasm of unspecified site of unspecified female breast: Secondary | ICD-10-CM | POA: Diagnosis not present

## 2015-07-09 DIAGNOSIS — E0865 Diabetes mellitus due to underlying condition with hyperglycemia: Secondary | ICD-10-CM | POA: Diagnosis not present

## 2015-07-09 DIAGNOSIS — F0280 Dementia in other diseases classified elsewhere without behavioral disturbance: Secondary | ICD-10-CM | POA: Diagnosis not present

## 2015-07-09 DIAGNOSIS — M170 Bilateral primary osteoarthritis of knee: Secondary | ICD-10-CM | POA: Diagnosis not present

## 2015-07-12 ENCOUNTER — Telehealth: Payer: Self-pay | Admitting: Internal Medicine

## 2015-07-12 DIAGNOSIS — M170 Bilateral primary osteoarthritis of knee: Secondary | ICD-10-CM | POA: Diagnosis not present

## 2015-07-12 DIAGNOSIS — F0280 Dementia in other diseases classified elsewhere without behavioral disturbance: Secondary | ICD-10-CM | POA: Diagnosis not present

## 2015-07-12 DIAGNOSIS — G301 Alzheimer's disease with late onset: Secondary | ICD-10-CM | POA: Diagnosis not present

## 2015-07-12 DIAGNOSIS — C50919 Malignant neoplasm of unspecified site of unspecified female breast: Secondary | ICD-10-CM | POA: Diagnosis not present

## 2015-07-12 DIAGNOSIS — E0865 Diabetes mellitus due to underlying condition with hyperglycemia: Secondary | ICD-10-CM | POA: Diagnosis not present

## 2015-07-12 NOTE — Telephone Encounter (Signed)
Order written Please send copy of addended note from 05/27/15

## 2015-07-12 NOTE — Progress Notes (Signed)
   Subjective:    Patient ID: Tanya Clayton, female    DOB: [birth year], 80 y.o.   MRN: [id number]  HPI Tanya Clayton suffers from chronic pain which is caused by contractures and osteoarthritis. The hospital bed has made all the difference in being able to alleviate some of the pain by allowing her torso and extremities to be moved at various times and to be positioned in ways not feasible in an ordinary bed. She is completely bed bound and the hospital bed is required to allow her aides to give her baths and continence care. Pain episodes which are frequent require immediate changes in body position that are not feasible with a normal bed.   Review of Systems     Objective:   Physical Exam        Assessment & Plan:

## 2015-07-12 NOTE — Telephone Encounter (Signed)
Hospice called in with update -  Pt has not declined since beginning hospice services. She will be discharged soon.  Pt would like to keep hospital bed, hospice is requiring a home visit.  In the visit notes, it needs to specify why she needs to keep th hospital bed.  Also, hospice needs an order to keep hospital bed.  cb number [phone], vanessa

## 2015-07-12 NOTE — Telephone Encounter (Signed)
05-27-15 NH Visit Note and order faxed to [phone]

## 2015-07-13 DIAGNOSIS — E0865 Diabetes mellitus due to underlying condition with hyperglycemia: Secondary | ICD-10-CM | POA: Diagnosis not present

## 2015-07-13 DIAGNOSIS — C50919 Malignant neoplasm of unspecified site of unspecified female breast: Secondary | ICD-10-CM | POA: Diagnosis not present

## 2015-07-13 DIAGNOSIS — M170 Bilateral primary osteoarthritis of knee: Secondary | ICD-10-CM | POA: Diagnosis not present

## 2015-07-13 DIAGNOSIS — G301 Alzheimer's disease with late onset: Secondary | ICD-10-CM | POA: Diagnosis not present

## 2015-07-13 DIAGNOSIS — F0280 Dementia in other diseases classified elsewhere without behavioral disturbance: Secondary | ICD-10-CM | POA: Diagnosis not present

## 2015-07-14 DIAGNOSIS — E0865 Diabetes mellitus due to underlying condition with hyperglycemia: Secondary | ICD-10-CM | POA: Diagnosis not present

## 2015-07-14 DIAGNOSIS — M170 Bilateral primary osteoarthritis of knee: Secondary | ICD-10-CM | POA: Diagnosis not present

## 2015-07-14 DIAGNOSIS — G301 Alzheimer's disease with late onset: Secondary | ICD-10-CM | POA: Diagnosis not present

## 2015-07-14 DIAGNOSIS — F0280 Dementia in other diseases classified elsewhere without behavioral disturbance: Secondary | ICD-10-CM | POA: Diagnosis not present

## 2015-07-14 DIAGNOSIS — C50919 Malignant neoplasm of unspecified site of unspecified female breast: Secondary | ICD-10-CM | POA: Diagnosis not present

## 2015-07-15 DIAGNOSIS — G301 Alzheimer's disease with late onset: Secondary | ICD-10-CM | POA: Diagnosis not present

## 2015-07-15 DIAGNOSIS — E0865 Diabetes mellitus due to underlying condition with hyperglycemia: Secondary | ICD-10-CM | POA: Diagnosis not present

## 2015-07-15 DIAGNOSIS — C50919 Malignant neoplasm of unspecified site of unspecified female breast: Secondary | ICD-10-CM | POA: Diagnosis not present

## 2015-07-15 DIAGNOSIS — M170 Bilateral primary osteoarthritis of knee: Secondary | ICD-10-CM | POA: Diagnosis not present

## 2015-07-15 DIAGNOSIS — F0280 Dementia in other diseases classified elsewhere without behavioral disturbance: Secondary | ICD-10-CM | POA: Diagnosis not present

## 2015-07-16 DIAGNOSIS — E0865 Diabetes mellitus due to underlying condition with hyperglycemia: Secondary | ICD-10-CM | POA: Diagnosis not present

## 2015-07-16 DIAGNOSIS — F0280 Dementia in other diseases classified elsewhere without behavioral disturbance: Secondary | ICD-10-CM | POA: Diagnosis not present

## 2015-07-16 DIAGNOSIS — G301 Alzheimer's disease with late onset: Secondary | ICD-10-CM | POA: Diagnosis not present

## 2015-07-16 DIAGNOSIS — C50919 Malignant neoplasm of unspecified site of unspecified female breast: Secondary | ICD-10-CM | POA: Diagnosis not present

## 2015-07-16 DIAGNOSIS — M170 Bilateral primary osteoarthritis of knee: Secondary | ICD-10-CM | POA: Diagnosis not present

## 2015-07-19 ENCOUNTER — Telehealth: Payer: Self-pay

## 2015-07-19 NOTE — Telephone Encounter (Signed)
Margaret with Sena Slate at [facility] said pt is total care but pt considered stable and not getting hospice care any more; pt needs higher level of care and son request if possible for pt to be moved to a facility that Dr Silvio Pate would continue to be pts doctor. Margaret request cb. Pt is presently at memory care assisted living.

## 2015-07-19 NOTE — Telephone Encounter (Signed)
I can do the FL-2 but Joycelyn Schmid and son will have to do the leg work. I only work at [facility] [employer] can call Seth Bake the admissions coordinator to ask about whether she can be considered there Her number is [phone] I will send her a note so she will be expecting his call

## 2015-07-20 NOTE — Telephone Encounter (Signed)
Margaret notified as instructed by telephone and verbalized understanding.

## 2015-08-05 DIAGNOSIS — M170 Bilateral primary osteoarthritis of knee: Secondary | ICD-10-CM | POA: Diagnosis not present

## 2015-08-05 DIAGNOSIS — G301 Alzheimer's disease with late onset: Secondary | ICD-10-CM | POA: Diagnosis not present

## 2015-08-09 ENCOUNTER — Other Ambulatory Visit: Payer: Self-pay | Admitting: Internal Medicine

## 2015-08-09 NOTE — Telephone Encounter (Signed)
Approved: okay #[phone] x 5 At assisted living

## 2015-08-09 NOTE — Telephone Encounter (Signed)
Last filled 07-07-15 #44 Last Nursing Home Visit 05-27-15

## 2015-08-09 NOTE — Telephone Encounter (Signed)
Spoke to Paradise at [facility]. She will find out why we are getting escript requests. They also can only give #90 a month.

## 2015-08-18 DIAGNOSIS — R6889 Other general symptoms and signs: Secondary | ICD-10-CM | POA: Diagnosis not present

## 2015-08-18 DIAGNOSIS — Z7401 Bed confinement status: Secondary | ICD-10-CM | POA: Diagnosis not present

## 2015-08-19 ENCOUNTER — Non-Acute Institutional Stay (SKILLED_NURSING_FACILITY): Payer: Medicare Other | Admitting: Internal Medicine

## 2015-08-19 ENCOUNTER — Encounter: Payer: Self-pay | Admitting: Internal Medicine

## 2015-08-19 DIAGNOSIS — M170 Bilateral primary osteoarthritis of knee: Secondary | ICD-10-CM

## 2015-08-19 DIAGNOSIS — G301 Alzheimer's disease with late onset: Secondary | ICD-10-CM

## 2015-08-19 DIAGNOSIS — K5904 Chronic idiopathic constipation: Secondary | ICD-10-CM

## 2015-08-19 DIAGNOSIS — Z853 Personal history of malignant neoplasm of breast: Secondary | ICD-10-CM | POA: Diagnosis not present

## 2015-08-19 DIAGNOSIS — E43 Unspecified severe protein-calorie malnutrition: Secondary | ICD-10-CM | POA: Diagnosis not present

## 2015-08-19 DIAGNOSIS — E119 Type 2 diabetes mellitus without complications: Secondary | ICD-10-CM | POA: Diagnosis not present

## 2015-08-19 DIAGNOSIS — F0280 Dementia in other diseases classified elsewhere without behavioral disturbance: Secondary | ICD-10-CM | POA: Diagnosis not present

## 2015-08-19 NOTE — Progress Notes (Signed)
LOCATION:  Tanya Clayton  PCP: Viviana Simpler, MD   Code Status: DNR  Goals of care: Advanced Directive information No flowsheet data found.     Extended Emergency Contact Information Primary Emergency Contact: Tanya  Clayton of [place] Phone: [phone] Relation: Other Secondary Emergency Contact: Tanya Clayton Address: [address] [place], [place] [address] [place] of [place] Phone: [phone] Work Phone: [phone] Relation: None   No Known Allergies  Chief Complaint  Patient presents with  . New Admit To SNF    New Admission     HPI:  Patient is a 80 y.o. female seen today for admission visit. She is here for long term care. She has PMH of Alzheimer's dementia, diabetes mellitus, OA among others. She is seen in her room today. She is under total care. She does not participate in HPI and ROS.   Review of Systems: unable to obtain   Past Medical History:  Diagnosis Date  . Allergy   . Alzheimer's disease 2006  . Arthritis   . Breast cancer ([name])    Dr Grayland Ormond  . Diabetes mellitus   . Sacral decubitus ulcer, stage II 02/2013   Past Surgical History:  Procedure Laterality Date  . ABDOMINAL HYSTERECTOMY  1960's  . MASTECTOMY  2007   Right--with implant   Social History:   reports that she has never smoked. She has never used smokeless tobacco. She reports that she does not drink alcohol. Her drug history is not on file.  Family History  Problem Relation Age of Onset  . Diabetes Mother     Medications:   Medication List       Accurate as of 08/19/15  3:32 PM. Always use your most recent med list.          acetaminophen 650 MG suppository Commonly known as:  TYLENOL Place 650 mg rectally every 4 (four) hours as needed.   B-D SINGLE USE SWABS REGULAR Pads FINGERSTICK BLOOD SUGAR TEST ONCE DAILY.   bisacodyl 10 MG suppository Commonly known as:  DULCOLAX Place 10 mg rectally as needed.     fentaNYL 25 MCG/HR patch Commonly known as:  DURAGESIC - dosed mcg/hr Place 25 mcg onto the skin every 3 (three) days.   glucose blood test strip True Track test strips; pt test blood sugar twice daily and as directed.250.00   hydrocortisone 2.5 % lotion Apply 1 application topically 3 (three) times daily as needed.   metFORMIN 500 MG tablet Commonly known as:  GLUCOPHAGE Take 500 mg by mouth daily with breakfast.   MONOJECT INS SYR .5CC/30G 30G X 5/16" 0.5 ML Misc Generic drug:  Insulin Syringe-Needle U-100 USE AS DIRECTED   polyethylene glycol packet Commonly known as:  MIRALAX / GLYCOLAX Take 17 g by mouth every other day.   traMADol 50 MG tablet Commonly known as:  ULTRAM TAKE ONE TABLET BY MOUTH 3 TIMES A DAY SCHEDULED FOR PAIN TAKE ONE TABLET BY MOUTH 3 TIMES A DAY AS NEEDED (MAY GIVE WITHIN 1HR OF STANDING   UNISTIK 3 COMFORT Misc Fingerstick blood sugar test once daily   zinc oxide 11.3 % Crea cream Commonly known as:  BALMEX Apply 1 application topically 4 (four) times daily. Apply to sacral area       Immunizations: Immunization History  Administered Date(s) Administered  . Influenza Whole 11/03/2008  . Pneumococcal Polysaccharide-23 10/31/2007  . Td 11/03/2008     Physical  Exam:  Vitals:   08/19/15 1525  BP: 122/66  Pulse: 68  Resp: 18  Temp: 97.5 F (36.4 C)  TempSrc: Oral  SpO2: 97%    General- elderly female, frail and built, in no acute distress Head- normocephalic, atraumatic Nose- no nasal discharge Throat- moist mucus membrane Eyes- no pallor, no icterus, no discharge Neck- no cervical lymphadenopathy Cardiovascular- normal s1,s2, no murmur Respiratory- bilateral clear to auscultation, no wheeze, no rhonchi, no crackles, no use of accessory muscles Abdomen- bowel sounds present, soft, non tender Musculoskeletal- generalized wekaness Neurological- disoriented Skin- warm and dry    Labs reviewed: None  available   Assessment/Plan  Dm type 2 Lab Results  Component Value Date   HGBA1C 6.9 (A) 06/10/2012   Check a1c. Continue metformin 500 mg daily and check cbg daily for now  Alzheimer's disease Continue supportive care. Under total care and unable to participate in HPI/ROS. Fall precautions and pressure ulcer prophylaxis.   Knee OA Continue fentanyl patch 25 mcg q 72 hr and tramadol 50 mg tid prn and monitor  Chronic constipation Continue miralax qod and dulcolax suppository daily as needed  History of breast cancer Off all treatment, monitor  Protein calorie malnutrition Get RD to evaluate, decline anticipated with her dementia. Monitor weight and po intake   Goals of care: long term care   Labs/tests ordered: cbc, cmp, a1c, lipid panel, tsh   Family/ staff Communication: reviewed care plan with patient's nursing supervisor    Blanchie Serve, MD Internal Medicine [address], [place] [address] Cell Phone (Monday-Friday 8 am - 5 pm): [phone] On Call: [phone] and follow prompts after 5 pm and on weekends Office Phone: [phone] Office Fax: [phone]

## 2015-08-23 DIAGNOSIS — E08311 Diabetes mellitus due to underlying condition with unspecified diabetic retinopathy with macular edema: Secondary | ICD-10-CM | POA: Diagnosis not present

## 2015-08-23 DIAGNOSIS — E039 Hypothyroidism, unspecified: Secondary | ICD-10-CM | POA: Diagnosis not present

## 2015-08-23 LAB — TSH: TSH: 1.68 u[IU]/mL (ref 0.41–5.90)

## 2015-08-23 LAB — HEMOGLOBIN A1C: Hemoglobin A1C: 4.8

## 2015-08-23 LAB — CBC AND DIFFERENTIAL
HEMATOCRIT: 34 % — AB (ref 36–46)
HEMOGLOBIN: 10.7 g/dL — AB (ref 12.0–16.0)
Platelets: 271 10*3/uL (ref 150–399)
WBC: 6.8 10*3/mL

## 2015-08-23 LAB — LIPID PANEL
CHOLESTEROL: 231 mg/dL — AB (ref 0–200)
HDL: 53 mg/dL (ref 35–70)
LDL CALC: 161 mg/dL
Triglycerides: 84 mg/dL (ref 40–160)

## 2015-08-23 LAB — BASIC METABOLIC PANEL
BUN: 11 mg/dL (ref 4–21)
Creatinine: 0.4 mg/dL — AB (ref 0.5–1.1)
GLUCOSE: 64 mg/dL
POTASSIUM: 3.8 mmol/L (ref 3.4–5.3)
SODIUM: 141 mmol/L (ref 137–147)

## 2015-08-24 ENCOUNTER — Non-Acute Institutional Stay (SKILLED_NURSING_FACILITY): Payer: Medicare Other | Admitting: Family

## 2015-08-24 DIAGNOSIS — E782 Mixed hyperlipidemia: Secondary | ICD-10-CM

## 2015-08-24 DIAGNOSIS — E119 Type 2 diabetes mellitus without complications: Secondary | ICD-10-CM

## 2015-08-24 DIAGNOSIS — E876 Hypokalemia: Secondary | ICD-10-CM

## 2015-08-24 DIAGNOSIS — D649 Anemia, unspecified: Secondary | ICD-10-CM

## 2015-08-24 DIAGNOSIS — R748 Abnormal levels of other serum enzymes: Secondary | ICD-10-CM | POA: Diagnosis not present

## 2015-08-24 NOTE — Progress Notes (Signed)
Location:    [facility] Room Number: [id number] Place of Service:  SNF [phone]) Provider: Crysten Kaman FNP-C   Viviana Simpler, MD  Patient Care Team: Venia Carbon, MD as PCP - General  Extended Emergency Contact Information Primary Emergency Contact: [place],Simone  [place] Phone: [phone] Relation: Other Secondary Emergency Contact: [place],James Address: [address], [place] [address] [place] Phone: [phone] Work Phone: [phone] Relation: None  Code Status:  DNR  Goals of care: Advanced Directive information No flowsheet data found.   Chief Complaint  Patient presents with  . Acute Visit    abnormal Labs     HPI:  Pt is a 80 y.o. female seen today at [facility] for an acute visit for evaluation of abnormal Labs results. She is seen in her room today. She is unable to provide HPI and ROS. Her recent lab results showed Hgb 10.7, Hgb A1C 4.8, K 3.4, AST 49, ALT 269, Chol 231, LDL 161 ( 08/23/2015). Facility staff reports no new concerns.    Past Medical History:  Diagnosis Date  . Allergy   . Alzheimer's disease 2006  . Arthritis   . Breast cancer ([place])    Dr Grayland Ormond  . Diabetes mellitus   . Sacral decubitus ulcer, stage II 02/2013   Past Surgical History:  Procedure Laterality Date  . ABDOMINAL HYSTERECTOMY  1960's  . MASTECTOMY  2007   Right--with implant    No Known Allergies    Medication List       Accurate as of 08/24/15 10:50 PM. Always use your most recent med list.          acetaminophen 650 MG suppository Commonly known as:  TYLENOL Place 650 mg rectally every 4 (four) hours as needed.   bisacodyl 10 MG suppository Commonly known as:  DULCOLAX Place 10 mg rectally as needed.   fentaNYL 25 MCG/HR patch Commonly known as:  DURAGESIC - dosed mcg/hr Place 25 mcg onto the skin every 3 (three) days.   hydrocortisone 2.5 %  lotion Apply 1 application topically 3 (three) times daily as needed.   metFORMIN 500 MG tablet Commonly known as:  GLUCOPHAGE Take 500 mg by mouth daily with breakfast.   polyethylene glycol packet Commonly known as:  MIRALAX / GLYCOLAX Take 17 g by mouth every other day.   traMADol 50 MG tablet Commonly known as:  ULTRAM TAKE ONE TABLET BY MOUTH 3 TIMES A DAY SCHEDULED FOR PAIN TAKE ONE TABLET BY MOUTH 3 TIMES A DAY AS NEEDED (MAY GIVE WITHIN 1HR OF STANDING   zinc oxide 11.3 % Crea cream Commonly known as:  BALMEX Apply 1 application topically 4 (four) times daily. Apply to sacral area       Review of Systems  Unable to perform ROS: Dementia    Immunization History  Administered Date(s) Administered  . Influenza Whole 11/03/2008  . Pneumococcal Polysaccharide-23 10/31/2007  . Td 11/03/2008   Pertinent  Health Maintenance Due  Topic Date Due  . OPHTHALMOLOGY EXAM  08/24/1947  . URINE MICROALBUMIN  08/24/1947  . DEXA SCAN  08/24/2002  . PNA vac Low Risk Adult (2 of 2 - PCV13) 10/30/2008  . HEMOGLOBIN A1C  12/10/2012  . FOOT EXAM  10/31/2013  . INFLUENZA VACCINE  08/03/2015   Fall Risk  05/28/2014 10/31/2012  Falls in the past year?  Exclusion - non ambulatory Exclusion - non ambulatory      Vitals:   08/24/15 1200  BP: (!) 145/70  Pulse: 68  Resp: 20  Temp: 98 F (36.7 C)  SpO2: 95%  Weight: 83 lb (37.6 kg)  Height: 5\' 1"  (1.549 m)   Body mass index is 15.68 kg/m. Physical Exam  Constitutional: No distress.  Thin Frail Elderly in no acute distress.   HENT:  Head: Normocephalic.  Mouth/Throat: Oropharynx is clear and moist. No oropharyngeal exudate.  Eyes: Conjunctivae and EOM are normal. Pupils are equal, round, and reactive to light. Left eye exhibits no discharge. No scleral icterus.  Neck: Normal range of motion. No JVD present. No thyromegaly present.  Cardiovascular: Normal rate, regular rhythm, normal heart sounds and intact distal pulses.   Exam reveals no gallop and no friction rub.   No murmur heard. Pulmonary/Chest: Effort normal and breath sounds normal. No respiratory distress. She has no wheezes. She has no rales.  Abdominal: Soft. Bowel sounds are normal. She exhibits no distension. There is no tenderness. There is no rebound and no guarding.  Musculoskeletal: She exhibits no edema, tenderness or deformity.  Moves X 4 extremities. Generalized weakness   Lymphadenopathy:    She has no cervical adenopathy.  Neurological: She is alert.  Skin: Skin is warm and dry. No rash noted. No erythema. No pallor.  Psychiatric: She has a normal mood and affect.    Labs reviewed:  Lab Results  Component Value Date   TSH 0.91 12/01/2009   Lab Results  Component Value Date   HGBA1C 6.9 (A) 06/10/2012   Assessment/Plan 1. Anemia, unspecified anemia type Hgb 10.7 (08/24/2015). Continue to monitor CBC  2. Hypokalemia K 3.4 ( 08/24/2015). Start Potassium 40 meq Tablet X 1 dose. Recheck BMP 08/26/2015.   3. Elevated liver enzymes AST 49, ALT 269 ( 08/23/2015).   4. Hyperlipidemia Chol 231, LDL 161 (08/23/2015). Will avoid statin for now due to elevated liver enzymes. Continue to monitor.   5. Controlled type 2 diabetes mellitus without complication, without long-term current use of insulin (HCC) Controlled. Hgb A1C 4.8 ( 08/23/2015). Will D/C Metformin 500 mg tablet. Continue to monitor CBG's weekly on Friday.     Family/ staff Communication: Reviewed plan of care with facility Nurse supervisor.   Labs/tests ordered: BMP 08/26/2015. Hgb A1C, Lipid panel in 3 months.

## 2015-08-26 DIAGNOSIS — E876 Hypokalemia: Secondary | ICD-10-CM | POA: Diagnosis not present

## 2015-08-26 LAB — BASIC METABOLIC PANEL
BUN: 11 mg/dL (ref 4–21)
Creatinine: 0.4 mg/dL — AB (ref 0.5–1.1)
Glucose: 64 mg/dL
Potassium: 3.8 mmol/L (ref 3.4–5.3)
SODIUM: 141 mmol/L (ref 137–147)

## 2015-08-27 DIAGNOSIS — M6281 Muscle weakness (generalized): Secondary | ICD-10-CM | POA: Diagnosis not present

## 2015-08-30 DIAGNOSIS — E876 Hypokalemia: Secondary | ICD-10-CM | POA: Diagnosis not present

## 2015-08-30 DIAGNOSIS — E039 Hypothyroidism, unspecified: Secondary | ICD-10-CM | POA: Diagnosis not present

## 2015-08-30 DIAGNOSIS — E08311 Diabetes mellitus due to underlying condition with unspecified diabetic retinopathy with macular edema: Secondary | ICD-10-CM | POA: Diagnosis not present

## 2015-08-30 LAB — BASIC METABOLIC PANEL
BUN: 13 mg/dL (ref 4–21)
CREATININE: 0.4 mg/dL — AB (ref 0.5–1.1)
GLUCOSE: 81 mg/dL
POTASSIUM: 3.5 mmol/L (ref 3.4–5.3)
SODIUM: 144 mmol/L (ref 137–147)

## 2015-08-30 LAB — HEPATIC FUNCTION PANEL
ALK PHOS: 68 U/L (ref 25–125)
ALT: 55 U/L — AB (ref 7–35)
AST: 21 U/L (ref 13–35)
BILIRUBIN, TOTAL: 0.3 mg/dL

## 2015-09-02 ENCOUNTER — Other Ambulatory Visit: Payer: Self-pay

## 2015-09-02 MED ORDER — FENTANYL 25 MCG/HR TD PT72: MEDICATED_PATCH | TRANSDERMAL | 0 refills | Status: DC

## 2015-09-02 NOTE — Telephone Encounter (Signed)
Rx faxed to [facility] @ [phone], phone number [phone]  

## 2015-09-06 DIAGNOSIS — M6281 Muscle weakness (generalized): Secondary | ICD-10-CM | POA: Diagnosis not present

## 2015-09-06 DIAGNOSIS — M1990 Unspecified osteoarthritis, unspecified site: Secondary | ICD-10-CM | POA: Diagnosis not present

## 2015-09-21 ENCOUNTER — Encounter: Payer: Self-pay | Admitting: Family

## 2015-09-21 ENCOUNTER — Non-Acute Institutional Stay (SKILLED_NURSING_FACILITY): Payer: Medicare Other | Admitting: Family

## 2015-09-21 DIAGNOSIS — M170 Bilateral primary osteoarthritis of knee: Secondary | ICD-10-CM

## 2015-09-21 DIAGNOSIS — G308 Other Alzheimer's disease: Secondary | ICD-10-CM | POA: Diagnosis not present

## 2015-09-21 DIAGNOSIS — F0281 Dementia in other diseases classified elsewhere with behavioral disturbance: Secondary | ICD-10-CM

## 2015-09-21 DIAGNOSIS — K5904 Chronic idiopathic constipation: Secondary | ICD-10-CM

## 2015-09-21 DIAGNOSIS — G309 Alzheimer's disease, unspecified: Secondary | ICD-10-CM

## 2015-09-21 NOTE — Progress Notes (Signed)
Location:  [facility] Room Number: [id number] Place of Service:  SNF [phone]) Provider:  Dinah Ngetich,FNP-C   Viviana Simpler, MD  Patient Care Team: Venia Carbon, MD as PCP - General  Extended Emergency Contact Information Primary Emergency Contact: Barbee,Simone  [place] Phone: [phone] Relation: Other Secondary Emergency Contact: Barriere,James Address: [address] [place], [place] [address] [place] Phone: [phone] Work Phone: [phone] Relation: None  Code Status:  DNR Goals of care: Advanced Directive information Advanced Directives 09/21/2015  Does patient have an advance directive? Yes  Type of Advance Directive Out of facility DNR (pink MOST or yellow form)  Does patient want to make changes to advanced directive? No - Patient declined  Copy of advanced directive(s) in chart? Yes     Chief Complaint  Patient presents with  . Medical Management of Chronic Issues    Medical Management of Chronic Issues    HPI:  Pt is a 80 y.o. female seen today at [facility] for medical management of chronic diseases.She has a medical history of Type 2 DM, Anemia, Alzheimer's disease, OA, Breast Cancer among other conditions. She seen in his room today. She has no recent weight changes, no fall episodes or hospital admission. Facility Nurse reports no new concerns.    Past Medical History:  Diagnosis Date  . Allergy   . Alzheimer's disease 2006  . Arthritis   . Breast cancer ([place])    Dr Grayland Ormond  . Diabetes mellitus   . Sacral decubitus ulcer, stage II 02/2013   Past Surgical History:  Procedure Laterality Date  . ABDOMINAL HYSTERECTOMY  1960's  . MASTECTOMY  2007   Right--with implant    No Known Allergies    Medication List       Accurate as of 09/21/15  4:12 PM. Always use your most recent med list.          acetaminophen 650 MG suppository Commonly  known as:  TYLENOL Place 650 mg rectally every 4 (four) hours as needed.   AMBULATORY NON FORMULARY MEDICATION Med Pass 2.0 Sig: 12ml by mouth twice daily between meals. Document percentage consumed for PCM/Underweight.   bisacodyl 10 MG suppository Commonly known as:  DULCOLAX Place 10 mg rectally as needed.   fentaNYL 25 MCG/HR patch Commonly known as:  DURAGESIC - dosed mcg/hr Apply 1 patch every 72 hours REMOVE OLD PATCH, EXTERNAL USE ONLY, ROTATE SITES, CHECK PLACEMENT EVERY SHIFT   hydrocortisone 2.5 % lotion Apply 1 application topically 3 (three) times daily as needed.   metFORMIN 500 MG tablet Commonly known as:  GLUCOPHAGE Take 500 mg by mouth daily with breakfast.   polyethylene glycol packet Commonly known as:  MIRALAX / GLYCOLAX Take 17 g by mouth every other day.   traMADol 50 MG tablet Commonly known as:  ULTRAM TAKE ONE TABLET BY MOUTH 3 TIMES A DAY SCHEDULED FOR PAIN TAKE ONE TABLET BY MOUTH 3 TIMES A DAY AS NEEDED (MAY GIVE WITHIN 1HR OF STANDING   zinc oxide 11.3 % Crea cream Commonly known as:  BALMEX Apply to sacral area four times daily until open areas resolve. Apply to sacral area two times daily as needed if redness returns.       Review of Systems  Unable to perform ROS: Dementia    Immunization History  Administered Date(s) Administered  . Influenza Whole 11/03/2008  .  Pneumococcal Polysaccharide-23 10/31/2007  . Td 11/03/2008   Pertinent  Health Maintenance Due  Topic Date Due  . OPHTHALMOLOGY EXAM  08/24/1945  . URINE MICROALBUMIN  08/24/1945  . DEXA SCAN  08/24/2002  . PNA vac Low Risk Adult (2 of 2 - PCV13) 10/30/2008  . HEMOGLOBIN A1C  12/10/2012  . FOOT EXAM  10/31/2013  . INFLUENZA VACCINE  08/03/2015   Fall Risk  05/28/2014 10/31/2012  Falls in the past year? Exclusion - non ambulatory Exclusion - non ambulatory    Vitals:   09/21/15 1604  BP: 125/69  Pulse: 63  Resp: 20  Temp: 97.5 F (36.4 C)  TempSrc: Oral    SpO2: 97%  Weight: 77 lb 9.6 oz (35.2 kg)  Height: 5\' 1"  (1.549 m)   Body mass index is 14.66 kg/m. Physical Exam  Constitutional: No distress.  Thin Frail Elderly in no acute distress.   HENT:  Head: Normocephalic.  Mouth/Throat: Oropharynx is clear and moist. No oropharyngeal exudate.  Eyes: Conjunctivae and EOM are normal. Pupils are equal, round, and reactive to light. Left eye exhibits no discharge. No scleral icterus.  Neck: Normal range of motion. No JVD present. No thyromegaly present.  Cardiovascular: Normal rate, regular rhythm, normal heart sounds and intact distal pulses.  Exam reveals no gallop and no friction rub.   No murmur heard. Pulmonary/Chest: Effort normal and breath sounds normal. No respiratory distress. She has no wheezes. She has no rales.  Abdominal: Soft. Bowel sounds are normal. She exhibits no distension. There is no tenderness. There is no rebound and no guarding.  Musculoskeletal: She exhibits no edema, tenderness or deformity.  Moves X 4 extremities. Generalized weakness   Lymphadenopathy:    She has no cervical adenopathy.  Neurological: She is alert.  Skin: Skin is warm and dry. No rash noted. No erythema. No pallor.  Psychiatric: She has a normal mood and affect.    Labs reviewed:  Lab Results  Component Value Date   TSH 0.91 12/01/2009   Lab Results  Component Value Date   HGBA1C 6.9 (A) 06/10/2012   Assessment/Plan OA  Continue on Tylenol and Tramadol. Continue on restorative therapy.  Constipation  Continue on current regimen. Encourage oral intake and hydration.   Alzheimer's Disease Progressive decline expected. Continue to assist with ADL's .    Family/ staff Communication: Reviewed plan of care with facility Nurse supervisor.   Labs/tests ordered: None

## 2015-10-06 ENCOUNTER — Other Ambulatory Visit: Payer: Self-pay | Admitting: *Deleted

## 2015-10-06 MED ORDER — TRAMADOL HCL 50 MG PO TABS: ORAL_TABLET | ORAL | 0 refills | Status: DC

## 2015-10-06 MED ORDER — FENTANYL 25 MCG/HR TD PT72: MEDICATED_PATCH | TRANSDERMAL | 0 refills | Status: DC

## 2015-10-06 NOTE — Telephone Encounter (Signed)
[facility] [phone] Fax: [phone]  

## 2015-10-18 ENCOUNTER — Non-Acute Institutional Stay (SKILLED_NURSING_FACILITY): Payer: Medicare Other | Admitting: Family

## 2015-10-18 DIAGNOSIS — E119 Type 2 diabetes mellitus without complications: Secondary | ICD-10-CM | POA: Diagnosis not present

## 2015-10-18 DIAGNOSIS — M170 Bilateral primary osteoarthritis of knee: Secondary | ICD-10-CM | POA: Diagnosis not present

## 2015-10-18 DIAGNOSIS — G309 Alzheimer's disease, unspecified: Secondary | ICD-10-CM

## 2015-10-18 DIAGNOSIS — F0281 Dementia in other diseases classified elsewhere with behavioral disturbance: Secondary | ICD-10-CM | POA: Diagnosis not present

## 2015-10-18 DIAGNOSIS — E43 Unspecified severe protein-calorie malnutrition: Secondary | ICD-10-CM | POA: Diagnosis not present

## 2015-10-18 DIAGNOSIS — K5904 Chronic idiopathic constipation: Secondary | ICD-10-CM

## 2015-10-18 DIAGNOSIS — G308 Other Alzheimer's disease: Secondary | ICD-10-CM

## 2015-10-18 NOTE — Progress Notes (Signed)
Location:  [facility] Room Number: [place] of Service:  SNF (31) Provider: Dinah Ngetich FNP-C   Viviana Simpler, MD  Patient Care Team: Venia Carbon, MD as PCP - General  Extended Emergency Contact Information Primary Emergency Contact: Loeffelholz,Simone  [place] Phone: [phone] Relation: Other Secondary Emergency Contact: Game,James Address: [address] [place], [place] [address] [place] Phone: [phone] Work Phone: [phone] Relation: None  Code Status:  DNR  Goals of care: Advanced Directive information Advanced Directives 09/21/2015  Does patient have an advance directive? Yes  Type of Advance Directive Out of facility DNR (pink MOST or yellow form)  Does patient want to make changes to advanced directive? No - Patient declined  Copy of advanced directive(s) in chart? Yes     Chief Complaint  Patient presents with  . Medical Management of Chronic Issues    HPI:  Pt is a 80 y.o. female seen today at  [facility] for medical management of chronic diseases. She has a significant medical history of Alzheimer, OA, Breast Cancer, type 2 DM among other conditions. She is seen in her room today. She is non-verbal during visit and does not participate in HPI and ROS due to Alzheimer.facility staff reports increased grinding of her teeth. No recent fall episodes or hospital admission reported.Her weight stable since prior visit one month ago. She continues to require total care assistance with ADL's.     Past Medical History:  Diagnosis Date  . Allergy   . Alzheimer's disease 2006  . Arthritis   . Breast cancer ([place])    Dr Grayland Ormond  . Diabetes mellitus   . Sacral decubitus ulcer, stage II 02/2013   Past Surgical History:  Procedure Laterality Date  . ABDOMINAL HYSTERECTOMY  1960's  . MASTECTOMY  2007   Right--with implant    No Known Allergies    Medication List       Accurate as of 10/18/15  5:12 PM. Always use your most recent med list.          acetaminophen 650 MG suppository Commonly known as:  TYLENOL Place 650 mg rectally every 4 (four) hours as needed.   AMBULATORY NON FORMULARY MEDICATION Med Pass 2.0 Sig: 182ml by mouth three times daily between meals. Document percentage consumed for PCM/Underweight.   bisacodyl 10 MG suppository Commonly known as:  DULCOLAX Place 10 mg rectally as needed.   fentaNYL 25 MCG/HR patch Commonly known as:  DURAGESIC - dosed mcg/hr Apply 1 patch every 72 hours REMOVE OLD PATCH, EXTERNAL USE ONLY, ROTATE SITES, CHECK PLACEMENT EVERY SHIFT   hydrocortisone 2.5 % lotion Apply 1 application topically 3 (three) times daily as needed.   polyethylene glycol packet Commonly known as:  MIRALAX / GLYCOLAX Take 17 g by mouth every other day.   traMADol 50 MG tablet Commonly known as:  ULTRAM Take one tablet by mouth three times daily for pain; Take one tablet by mouth three times daily as needed for pain. May take within one hour of standing dose.   zinc oxide 11.3 % Crea cream Commonly known as:  BALMEX Apply to sacral area four times daily until open areas resolve. Apply to sacral area two times daily as needed if redness returns.       Review of Systems  Unable to perform ROS: Dementia    Immunization  History  Administered Date(s) Administered  . Influenza Whole 11/03/2008  . Pneumococcal Polysaccharide-23 10/31/2007  . Td 11/03/2008   Pertinent  Health Maintenance Due  Topic Date Due  . OPHTHALMOLOGY EXAM  08/24/1945  . URINE MICROALBUMIN  08/24/1945  . DEXA SCAN  08/24/2002  . PNA vac Low Risk Adult (2 of 2 - PCV13) 10/30/2008  . HEMOGLOBIN A1C  12/10/2012  . FOOT EXAM  10/31/2013  . INFLUENZA VACCINE  08/03/2015   Fall Risk  05/28/2014 10/31/2012  Falls in the past year? Exclusion - non ambulatory Exclusion - non ambulatory      Vitals:   10/18/15 1130   BP: (!) 145/70  Pulse: 68  Resp: 20  Temp: 98 F (36.7 C)  SpO2: 96%  Weight: 76 lb (34.5 kg)  Height: 5\' 1"  (1.549 m)   Body mass index is 14.36 kg/m. Physical Exam  Constitutional: No distress.  Thin Frail Elderly up on wheelchair constantly grinding her teeth.   HENT:  Head: Normocephalic.  Mouth/Throat: Oropharynx is clear and moist. No oropharyngeal exudate.  Eyes: Conjunctivae and EOM are normal. Pupils are equal, round, and reactive to light. Left eye exhibits no discharge. No scleral icterus.  Neck: Normal range of motion. No JVD present. No thyromegaly present.  Cardiovascular: Normal rate, regular rhythm, normal heart sounds and intact distal pulses.  Exam reveals no gallop and no friction rub.   No murmur heard. Pulmonary/Chest: Effort normal and breath sounds normal. No respiratory distress. She has no wheezes. She has no rales.  Abdominal: Soft. Bowel sounds are normal. She exhibits no distension. There is no tenderness. There is no rebound and no guarding.  Musculoskeletal: She exhibits no edema, tenderness or deformity.  Moves X 4 extremities. Generalized weakness   Lymphadenopathy:    She has no cervical adenopathy.  Neurological: She is alert.  Skin: Skin is warm and dry. No rash noted. No erythema. No pallor.  Psychiatric: She has a normal mood and affect.    Labs reviewed:  Recent Labs  08/23/15  NA 141  K 3.8  BUN 11  CREATININE 0.4*    Recent Labs  08/23/15  WBC 6.8  HGB 10.7*  HCT 34*  PLT 271   Lab Results  Component Value Date   TSH 1.68 08/23/2015   Lab Results  Component Value Date   HGBA1C 4.8 08/23/2015   Assessment/Plan 1. Protein-calorie malnutrition, severe ([place]) Now on Med pass 120 cc three times daily. RD to continue to monitor.   2. Primary osteoarthritis of both knees Continue current pain regimen.   3. Functional constipation Continue on Miralax and colace.Encourage fluid intake.   4. Alzheimer's dementia with  behavioral disturbance, unspecified timing of dementia onset Has new behavior grinding teeth constantly. Continue to assist with ADL's.Fall and safety precautions.   5. Controlled type 2 diabetes mellitus without complication, without long-term current use of insulin (HCC) Recent Hgb A1C 4.8 Metformin Discontinued. CBG's in the 60's. Will remove from Dx list. Continue to monitor.     Family/ staff Communication: Reviewed plan of care with facility Nurse supervisor.   Labs/tests ordered:  None

## 2015-10-26 DIAGNOSIS — E08311 Diabetes mellitus due to underlying condition with unspecified diabetic retinopathy with macular edema: Secondary | ICD-10-CM | POA: Diagnosis not present

## 2015-10-26 DIAGNOSIS — D508 Other iron deficiency anemias: Secondary | ICD-10-CM | POA: Diagnosis not present

## 2015-10-26 LAB — HEMOGLOBIN A1C
HEMOGLOBIN A1C: 4.9
Hemoglobin A1C: 4.9

## 2015-11-05 ENCOUNTER — Other Ambulatory Visit: Payer: Self-pay | Admitting: *Deleted

## 2015-11-05 MED ORDER — FENTANYL 25 MCG/HR TD PT72: MEDICATED_PATCH | TRANSDERMAL | 0 refills | Status: DC

## 2015-11-05 NOTE — Telephone Encounter (Signed)
[facility] [phone] Fax: [phone]  

## 2015-11-22 ENCOUNTER — Non-Acute Institutional Stay (SKILLED_NURSING_FACILITY): Payer: Medicare Other | Admitting: Family

## 2015-11-22 DIAGNOSIS — F0280 Dementia in other diseases classified elsewhere without behavioral disturbance: Secondary | ICD-10-CM | POA: Diagnosis not present

## 2015-11-22 DIAGNOSIS — E119 Type 2 diabetes mellitus without complications: Secondary | ICD-10-CM

## 2015-11-22 DIAGNOSIS — G309 Alzheimer's disease, unspecified: Secondary | ICD-10-CM | POA: Diagnosis not present

## 2015-11-22 DIAGNOSIS — M170 Bilateral primary osteoarthritis of knee: Secondary | ICD-10-CM | POA: Diagnosis not present

## 2015-11-22 DIAGNOSIS — K5904 Chronic idiopathic constipation: Secondary | ICD-10-CM

## 2015-11-22 DIAGNOSIS — R634 Abnormal weight loss: Secondary | ICD-10-CM | POA: Diagnosis not present

## 2015-11-22 DIAGNOSIS — E43 Unspecified severe protein-calorie malnutrition: Secondary | ICD-10-CM | POA: Diagnosis not present

## 2015-11-22 NOTE — Progress Notes (Signed)
Location:  [facility] Room Number: [place] of Service:  SNF (31) Provider:  Annelisa Ryback FNP-C   Viviana Simpler, MD  Patient Care Team: Venia Carbon, MD as PCP - General  Extended Emergency Contact Information Primary Emergency Contact: Simko,Simone  [place] Phone: [phone] Relation: Other Secondary Emergency Contact: Louth,James Address: [address] [place], [place] [address] [place] Phone: [phone] Work Phone: [phone] Relation: None  Code Status:  DNR  Goals of care: Advanced Directive information Advanced Directives 09/21/2015  Does patient have an advance directive? Yes  Type of Advance Directive Out of facility DNR (pink MOST or yellow form)  Does patient want to make changes to advanced directive? No - Patient declined  Copy of advanced directive(s) in chart? Yes     Chief Complaint  Patient presents with  . Medical Management of Chronic Issues    HPI:  Pt is a 80 y.o. female seen today at [facility]  for medical management of chronic diseases.She has a significant medical history of Alzheimer disease,OA, type 2 DM, Protein Malnutrition among other conditions. She is seen in her room today. She is non-verbal during assessment unable to provider HPI and ROS though just smiles at provider. She has had a weight loss of 7 pounds over the past two months. She continue to follow up with RD. She is on protein supplements Med Pass 2.0 120 ml three times daily. No recent fall episodes or hospital admission.     Past Medical History:  Diagnosis Date  . Allergy   . Alzheimer's disease 2006  . Arthritis   . Breast cancer ([place])    Dr Grayland Ormond  . Diabetes mellitus   . Sacral decubitus ulcer, stage II 02/2013   Past Surgical History:  Procedure Laterality Date  . ABDOMINAL HYSTERECTOMY  1960's  . MASTECTOMY  2007   Right--with implant    No  Known Allergies    Medication List       Accurate as of 11/22/15  3:57 PM. Always use your most recent med list.          acetaminophen 650 MG suppository Commonly known as:  TYLENOL Place 650 mg rectally every 4 (four) hours as needed.   AMBULATORY NON FORMULARY MEDICATION Med Pass 2.0 Sig: 114ml by mouth three times daily between meals. Document percentage consumed for PCM/Underweight.   bisacodyl 10 MG suppository Commonly known as:  DULCOLAX Place 10 mg rectally as needed.   fentaNYL 25 MCG/HR patch Commonly known as:  DURAGESIC - dosed mcg/hr Apply 1 patch every 72 hours REMOVE OLD PATCH, EXTERNAL USE ONLY, ROTATE SITES, CHECK PLACEMENT EVERY SHIFT   hydrocortisone 2.5 % lotion Apply 1 application topically 3 (three) times daily as needed.   polyethylene glycol packet Commonly known as:  MIRALAX / GLYCOLAX Take 17 g by mouth every other day.   traMADol 50 MG tablet Commonly known as:  ULTRAM Take one tablet by mouth three times daily for pain; Take one tablet by mouth three times daily as needed for pain. May take within one hour of standing dose.   zinc oxide 11.3 % Crea cream Commonly known as:  BALMEX Apply to sacral area four times daily until open areas resolve. Apply to sacral area two times daily as needed if redness returns.       Review of Systems  Unable to perform ROS: Dementia    Immunization History  Administered Date(s) Administered  . Influenza Whole 11/03/2008  . Pneumococcal Polysaccharide-23 10/31/2007  . Td 11/03/2008   Pertinent  Health Maintenance Due  Topic Date Due  . OPHTHALMOLOGY EXAM  08/24/1945  . URINE MICROALBUMIN  08/24/1945  . DEXA SCAN  08/24/2002  . PNA vac Low Risk Adult (2 of 2 - PCV13) 10/30/2008  . FOOT EXAM  10/31/2013  . INFLUENZA VACCINE  08/03/2015  . HEMOGLOBIN A1C  02/23/2016   Fall Risk  05/28/2014 10/31/2012  Falls in the past year? Exclusion - non ambulatory Exclusion - non ambulatory      Vitals:     11/22/15 1130  BP: (!) 145/70  Pulse: 68  Resp: 20  Temp: 98 F (36.7 C)  SpO2: 96%  Weight: 75 lb 9.6 oz (34.3 kg)  Height: 5\' 1"  (1.549 m)   Body mass index is 14.28 kg/m. Physical Exam  Constitutional: No distress.  Thin Frail Elderly up on wheelchair constantly grinding her teeth.   HENT:  Head: Normocephalic.  Mouth/Throat: Oropharynx is clear and moist. No oropharyngeal exudate.  Eyes: Conjunctivae and EOM are normal. Pupils are equal, round, and reactive to light. Left eye exhibits no discharge. No scleral icterus.  Neck: Normal range of motion. No JVD present. No thyromegaly present.  Cardiovascular: Normal rate, regular rhythm, normal heart sounds and intact distal pulses.  Exam reveals no gallop and no friction rub.   No murmur heard. Pulmonary/Chest: Effort normal and breath sounds normal. No respiratory distress. She has no wheezes. She has no rales.  Abdominal: Soft. Bowel sounds are normal. She exhibits no distension. There is no tenderness. There is no rebound and no guarding.  Musculoskeletal: She exhibits no edema, tenderness or deformity.  Moves X 4 extremities. Generalized weakness   Lymphadenopathy:    She has no cervical adenopathy.  Neurological: She is alert.  Skin: Skin is warm and dry. No rash noted. No erythema. No pallor.  Psychiatric: She has a normal mood and affect.    Labs reviewed:  Recent Labs  08/23/15  NA 141  K 3.8  BUN 11  CREATININE 0.4*    Recent Labs  08/23/15  WBC 6.8  HGB 10.7*  HCT 34*  PLT 271   Lab Results  Component Value Date   TSH 1.68 08/23/2015   Lab Results  Component Value Date   HGBA1C 4.8 08/23/2015   Lab Results  Component Value Date   CHOL 231 (A) 08/23/2015   HDL 53 08/23/2015   LDLCALC 161 08/23/2015   TRIG 84 08/23/2015   Assessment/Plan Weight loss  Has had 7 pounds loss over last two months.continue to encourage oral intake. Continue Protein supplements.   OA  Continue tylenol  PRN  Type 2 DM  CBG's ranging in the 80's. diet controlled.continue to monitor.    Constipation  Current regimen effective.  Alzheimer's Disease No new behavioral issues reported. Continue to assist with ADL's. Skin care.   Protein Malnutrition Continue with Med pass 2.0 120 mls three times daily. RD to continue to monitor.     Family/ staff Communication: Reviewed plan of care with facility Nurse supervisor.   Labs/tests ordered:  none

## 2015-11-30 ENCOUNTER — Other Ambulatory Visit: Payer: Self-pay | Admitting: *Deleted

## 2015-11-30 MED ORDER — TRAMADOL HCL 50 MG PO TABS: ORAL_TABLET | ORAL | 0 refills | Status: DC

## 2015-11-30 NOTE — Telephone Encounter (Signed)
[facility] [phone] Fax: [phone]  

## 2015-12-06 ENCOUNTER — Other Ambulatory Visit: Payer: Self-pay | Admitting: *Deleted

## 2015-12-06 MED ORDER — FENTANYL 25 MCG/HR TD PT72: MEDICATED_PATCH | TRANSDERMAL | 0 refills | Status: DC

## 2015-12-06 NOTE — Telephone Encounter (Signed)
[facility] # [phone] Fax: [phone] 

## 2015-12-22 ENCOUNTER — Non-Acute Institutional Stay (SKILLED_NURSING_FACILITY): Payer: Medicare Other | Admitting: Family

## 2015-12-22 DIAGNOSIS — Z853 Personal history of malignant neoplasm of breast: Secondary | ICD-10-CM | POA: Diagnosis not present

## 2015-12-22 DIAGNOSIS — K5904 Chronic idiopathic constipation: Secondary | ICD-10-CM

## 2015-12-22 DIAGNOSIS — G308 Other Alzheimer's disease: Secondary | ICD-10-CM | POA: Diagnosis not present

## 2015-12-22 DIAGNOSIS — E43 Unspecified severe protein-calorie malnutrition: Secondary | ICD-10-CM | POA: Diagnosis not present

## 2015-12-22 DIAGNOSIS — M170 Bilateral primary osteoarthritis of knee: Secondary | ICD-10-CM | POA: Diagnosis not present

## 2015-12-22 DIAGNOSIS — F0281 Dementia in other diseases classified elsewhere with behavioral disturbance: Secondary | ICD-10-CM

## 2015-12-22 DIAGNOSIS — Z794 Long term (current) use of insulin: Secondary | ICD-10-CM

## 2015-12-22 DIAGNOSIS — G309 Alzheimer's disease, unspecified: Secondary | ICD-10-CM

## 2015-12-22 DIAGNOSIS — E119 Type 2 diabetes mellitus without complications: Secondary | ICD-10-CM | POA: Diagnosis not present

## 2015-12-22 NOTE — Progress Notes (Signed)
Location:  [facility] Room Number: [place] of Service:  SNF (31) Provider: Melquiades Kovar FNP-C   Viviana Simpler, MD  Patient Care Team: Venia Carbon, MD as PCP - General  Extended Emergency Contact Information Primary Emergency Contact: Magadan,Simone  [place] Phone: [phone] Relation: Other Secondary Emergency Contact: Dorce,James Address: [address] [place], [place] [address] [place] Phone: [phone] Work Phone: [phone] Relation: None  Code Status:  DNR  Goals of care: Advanced Directive information Advanced Directives 09/21/2015  Does Patient Have a Medical Advance Directive? Yes  Type of Advance Directive Out of facility DNR (pink MOST or yellow form)  Does patient want to make changes to medical advance directive? No - Patient declined  Copy of [place] in Chart? Yes     Chief Complaint  Patient presents with  . Medical Management of Chronic Issues    HPI:  Pt is a 80 y.o. female seen today at [facility]  for medical management of chronic diseases.She has a medical history of Type 2 DM, OA, Breast Cancer, Alzheimer's disease among other conditions. She is seen in her room today awake but pleasantly confused at baseline. Unable to provide HPI and ROS. Facility staff reports no new concerns.     Past Medical History:  Diagnosis Date  . Allergy   . Alzheimer's disease 2006  . Arthritis   . Breast cancer ([place])    Dr Grayland Ormond  . Diabetes mellitus   . Sacral decubitus ulcer, stage II 02/2013   Past Surgical History:  Procedure Laterality Date  . ABDOMINAL HYSTERECTOMY  1960's  . MASTECTOMY  2007   Right--with implant    No Known Allergies  Allergies as of 12/22/2015   No Known Allergies     Medication List       Accurate as of 12/22/15  7:53 PM. Always use your most recent med list.            acetaminophen 650 MG suppository Commonly known as:  TYLENOL Place 650 mg rectally every 4 (four) hours as needed.   AMBULATORY NON FORMULARY MEDICATION Med Pass 2.0 Sig: 150ml by mouth three times daily between meals. Document percentage consumed for PCM/Underweight.   bisacodyl 10 MG suppository Commonly known as:  DULCOLAX Place 10 mg rectally as needed.   fentaNYL 25 MCG/HR patch Commonly known as:  DURAGESIC - dosed mcg/hr Apply 1 patch every 72 hours REMOVE OLD PATCH, EXTERNAL USE ONLY, ROTATE SITES, CHECK PLACEMENT EVERY SHIFT   hydrocortisone 2.5 % lotion Apply 1 application topically 3 (three) times daily as needed.   polyethylene glycol packet Commonly known as:  MIRALAX / GLYCOLAX Take 17 g by mouth every other day.   traMADol 50 MG tablet Commonly known as:  ULTRAM Take one tablet by mouth three times daily for pain; Take one tablet by mouth three times daily as needed for pain. May take within one hour of standing dose.   zinc oxide 11.3 % Crea cream Commonly known as:  BALMEX Apply to sacral area four times daily until open areas resolve. Apply to sacral area two times daily as needed if redness returns.       Review of Systems  Unable to perform ROS: Dementia    Immunization History  Administered Date(s) Administered  . Influenza Whole 11/03/2008  . Pneumococcal Polysaccharide-23  10/31/2007  . Td 11/03/2008   Pertinent  Health Maintenance Due  Topic Date Due  . OPHTHALMOLOGY EXAM  08/24/1945  . URINE MICROALBUMIN  08/24/1945  . DEXA SCAN  08/24/2002  . PNA vac Low Risk Adult (2 of 2 - PCV13) 10/30/2008  . FOOT EXAM  10/31/2013  . INFLUENZA VACCINE  08/03/2015  . HEMOGLOBIN A1C  02/23/2016   Fall Risk  05/28/2014 10/31/2012  Falls in the past year? Exclusion - non ambulatory Exclusion - non ambulatory   Functional Status Survey:    Vitals:   12/22/15 1200  BP: 110/76  Pulse: 76  Resp: 18  Temp: 98.9 F (37.2 C)  SpO2: 95%  Weight: 75  lb 4.8 oz (34.2 kg)  Height: 5\' 1"  (1.549 m)   Body mass index is 14.23 kg/m. Physical Exam  Constitutional: No distress.  Thin Frail Elderly in no acute distress  HENT:  Head: Normocephalic.  Mouth/Throat: Oropharynx is clear and moist. No oropharyngeal exudate.  Eyes: Conjunctivae and EOM are normal. Pupils are equal, round, and reactive to light. Left eye exhibits no discharge. No scleral icterus.  Neck: Normal range of motion. No JVD present. No thyromegaly present.  Cardiovascular: Normal rate, regular rhythm, normal heart sounds and intact distal pulses.  Exam reveals no gallop and no friction rub.   No murmur heard. Pulmonary/Chest: Effort normal and breath sounds normal. No respiratory distress. She has no wheezes. She has no rales.  Abdominal: Soft. Bowel sounds are normal. She exhibits no distension. There is no tenderness. There is no rebound and no guarding.  Genitourinary:  Genitourinary Comments: Incontinent for both bladder and bowel.   Musculoskeletal: She exhibits no edema, tenderness or deformity.  Moves X 4 extremities.wheelchair/bed bound.   Lymphadenopathy:    She has no cervical adenopathy.  Neurological: She is alert.  Pleasantly confused at baseline.   Skin: Skin is warm and dry. No rash noted. No erythema. No pallor.  Psychiatric: She has a normal mood and affect.    Labs reviewed:  Recent Labs  08/23/15  NA 141  K 3.8  BUN 11  CREATININE 0.4*    Recent Labs  08/23/15  WBC 6.8  HGB 10.7*  HCT 34*  PLT 271   Lab Results  Component Value Date   TSH 1.68 08/23/2015   Lab Results  Component Value Date   HGBA1C 4.8 08/23/2015   Lab Results  Component Value Date   CHOL 231 (A) 08/23/2015   HDL 53 08/23/2015   LDLCALC 161 08/23/2015   TRIG 84 08/23/2015   Assessment/Plan 1. Controlled type 2 diabetes mellitus without complication, with long-term current use of insulin (HCC) Stable. currently no meds. Continue to monitor.   2. Primary  osteoarthritis of both knees Continue Tylenol and Tramadol PRN   3. Protein-calorie malnutrition, severe ([place]) Continue on protein supplements. Continue to follow up with dietician.   4. Functional constipation Current regimen effective. Continue to encourage hydration and oral intake.   5. Alzheimer's dementia with behavioral disturbance, unspecified timing of dementia onset Pleasantly confused at baseline. Continue to assist with ADl's. Fall and safety precaution. Skin care.   6. HX of breast cancer Code status DNR. Continue to monitor.     Family/ staff Communication: Reviewed plan of care with facility Nurse supervisor.   Labs/tests ordered: None

## 2015-12-29 DIAGNOSIS — Z9181 History of falling: Secondary | ICD-10-CM | POA: Diagnosis not present

## 2015-12-29 DIAGNOSIS — R293 Abnormal posture: Secondary | ICD-10-CM | POA: Diagnosis not present

## 2015-12-29 DIAGNOSIS — M6281 Muscle weakness (generalized): Secondary | ICD-10-CM | POA: Diagnosis not present

## 2015-12-30 ENCOUNTER — Other Ambulatory Visit: Payer: Self-pay

## 2015-12-30 MED ORDER — FENTANYL 25 MCG/HR TD PT72: MEDICATED_PATCH | TRANSDERMAL | 0 refills | Status: DC

## 2015-12-30 NOTE — Telephone Encounter (Signed)
Rx faxed to [facility] @ [phone], phone number [phone]  

## 2016-01-21 ENCOUNTER — Non-Acute Institutional Stay (SKILLED_NURSING_FACILITY): Payer: Medicare Other | Admitting: Internal Medicine

## 2016-01-21 ENCOUNTER — Encounter: Payer: Self-pay | Admitting: Internal Medicine

## 2016-01-21 DIAGNOSIS — M170 Bilateral primary osteoarthritis of knee: Secondary | ICD-10-CM | POA: Diagnosis not present

## 2016-01-21 DIAGNOSIS — G308 Other Alzheimer's disease: Secondary | ICD-10-CM

## 2016-01-21 DIAGNOSIS — E119 Type 2 diabetes mellitus without complications: Secondary | ICD-10-CM | POA: Diagnosis not present

## 2016-01-21 DIAGNOSIS — G309 Alzheimer's disease, unspecified: Principal | ICD-10-CM

## 2016-01-21 DIAGNOSIS — F0281 Dementia in other diseases classified elsewhere with behavioral disturbance: Secondary | ICD-10-CM | POA: Diagnosis not present

## 2016-01-21 DIAGNOSIS — E43 Unspecified severe protein-calorie malnutrition: Secondary | ICD-10-CM | POA: Diagnosis not present

## 2016-01-21 NOTE — Progress Notes (Signed)
LOCATION:  [facility]  PCP: Viviana Simpler, MD   Code Status: DNR  Goals of care: Advanced Directive information Advanced Directives 09/21/2015  Does Patient Have a Medical Advance Directive? Yes  Type of Advance Directive Out of facility DNR (pink MOST or yellow form)  Does patient want to make changes to medical advance directive? No - Patient declined  Copy of [place] in Chart? Yes       Extended Emergency Contact Information Primary Emergency Contact: Stella,Simone  [place] Phone: [phone] Relation: Other Secondary Emergency Contact: Kobayashi,James Address: [address], [place] [address] [place] Phone: [phone] Work Phone: [phone] Relation: None   No Known Allergies  Chief Complaint  Patient presents with  . Medical Management of Chronic Issues    Routine Visit      HPI:  Patient is a 81 y.o. female seen today for Routine visit. She is under total care. She has a constant dementia and does not participate in history of present illness and review of system. No fall reported. Has been taking her medication. No pressure ulcer sore has been reported by nursing staff.  Review of Systems: unable to obtain   Past Medical History:  Diagnosis Date  . Allergy   . Alzheimer's disease 2006  . Arthritis   . Breast cancer ([place])    Dr Grayland Ormond  . Diabetes mellitus   . Sacral decubitus ulcer, stage II 02/2013   Past Surgical History:  Procedure Laterality Date  . ABDOMINAL HYSTERECTOMY  1960's  . MASTECTOMY  2007   Right--with implant   Social History:   reports that she has never smoked. She has never used smokeless tobacco. She reports that she does not drink alcohol. Her drug history is not on file.  Family History  Problem Relation Age of Onset  . Diabetes Mother     Medications: Allergies as of 01/21/2016   No Known Allergies     Medication List         Accurate as of 01/21/16  4:28 PM. Always use your most recent med list.          AMBULATORY NON FORMULARY MEDICATION Med Pass 2.0 Sig: 118ml by mouth three times daily between meals. Document percentage consumed for PCM/Underweight.   bisacodyl 10 MG suppository Commonly known as:  DULCOLAX Place 10 mg rectally as needed.   fentaNYL 25 MCG/HR patch Commonly known as:  DURAGESIC - dosed mcg/hr Apply 1 patch every 72 hours REMOVE OLD PATCH, EXTERNAL USE ONLY, ROTATE SITES, CHECK PLACEMENT EVERY SHIFT   hydrocortisone 2.5 % lotion Apply 1 application topically 3 (three) times daily as needed.   polyethylene glycol packet Commonly known as:  MIRALAX / GLYCOLAX Take 17 g by mouth every other day.   traMADol 50 MG tablet Commonly known as:  ULTRAM Take one tablet by mouth three times daily for pain; Take one tablet by mouth three times daily as needed for pain. May take within one hour of standing dose.   zinc oxide 11.3 % Crea cream Commonly known as:  BALMEX Apply to sacral area four times daily until open areas resolve. Apply to sacral area two times daily as needed if redness returns.       Immunizations: Immunization History  Administered Date(s) Administered  . Influenza Whole 11/03/2008  . Pneumococcal Polysaccharide-23 10/31/2007  . Td 11/03/2008  Physical Exam:  Vitals:   01/21/16 1625  BP: 120/72  Pulse: 76  Resp: 20  Temp: 97.8 F (36.6 C)  TempSrc: Oral  SpO2: 99%  Weight: 75 lb 4.8 oz (34.2 kg)  Height: 5\' 3"  (1.6 m)    General- elderly female, frail and thin built, in no acute distress Head- normocephalic, atraumatic Throat- moist mucus membrane Eyes- no pallor, no icterus, no discharge Neck- no cervical lymphadenopathy Cardiovascular- normal s1,s2, no murmur Respiratory- bilateral clear to auscultation Abdomen- bowel sounds present, soft, non tender Musculoskeletal- generalized wekaness Skin- warm and dry    Labs reviewed: CBC     Component Value Date/Time   WBC 6.8 08/23/2015   WBC 9.4 07/08/2012 0817   WBC 12.1 (H) 12/01/2009 1304   RBC 4.04 07/08/2012 0817   RBC 4.99 12/01/2009 1304   HGB 10.7 (A) 08/23/2015   HGB 11.9 (L) 07/08/2012 0817   HCT 34 (A) 08/23/2015   HCT 35.9 07/08/2012 0817   PLT 271 08/23/2015   PLT 237 07/08/2012 0817   MCV 89 07/08/2012 0817   MCH 29.4 07/08/2012 0817   MCHC 33.0 07/08/2012 0817   MCHC 33.2 12/01/2009 1304   RDW 13.6 07/08/2012 0817   LYMPHSABS 3.9 12/01/2009 1304   MONOABS 0.8 12/01/2009 1304   EOSABS 0.1 12/01/2009 1304   BASOSABS 0.0 12/01/2009 1304   CMP Latest Ref Rng & Units 08/23/2015 07/08/2012 06/16/2011  Glucose 65 - 99 mg/dL - 88 119(H)  BUN 4 - 21 mg/dL 11 13 10   Creatinine 0.5 - 1.1 mg/dL 0.4(A) 0.85 0.86  Sodium 137 - 147 mmol/L 141 144 144  Potassium 3.4 - 5.3 mmol/L 3.8 3.2(L) 3.5  Chloride 98 - 107 mmol/L - 109(H) 107  CO2 21 - 32 mmol/L - 28 28  Calcium 8.5 - 10.1 mg/dL - 9.1 8.9  Total Protein 6.4 - 8.2 g/dL - 7.0 6.7  Total Bilirubin 0.2 - 1.0 mg/dL - 0.4 0.4  Alkaline Phos 50 - 136 Unit/L - 90 82  AST 15 - 37 Unit/L - 41(H) 14(L)  ALT 12 - 78 U/L - 31 15     Assessment/Plan  Alzheimer's dementia without behavioral disturbance Provide total care. Fall precautions and pressure ulcer prophylaxis to be taken.  Knee osteoarthritis With contractured. Continue fentanyl patch. Continue tramadol 50 mg twice a day for now. Fall precautions to be taken.  Severe protein calorie malnutrition Continue med Pass supplement, declining anticipated with her advanced dementia. Pressure ulcer prophylaxis to be taken. Complete assistance with feeding provided  Dm type 2 Lab Results  Component Value Date   HGBA1C 4.9 10/26/2015   Well controlled. Currently off all medication. Monitor clinically. CBG on review has been stable.     Labs/tests ordered: None   Family/ staff Communication: reviewed care plan with patient's nursing  supervisor    Blanchie Serve, MD Internal Medicine [place], [place] [address] Cell Phone (Monday-Friday 8 am - 5 pm): [phone] On Call: [phone] and follow prompts after 5 pm and on weekends Office Phone: [phone] Office Fax: [phone]

## 2016-01-24 ENCOUNTER — Other Ambulatory Visit: Payer: Self-pay | Admitting: *Deleted

## 2016-01-24 MED ORDER — TRAMADOL HCL 50 MG PO TABS: ORAL_TABLET | ORAL | 0 refills | Status: DC

## 2016-01-24 NOTE — Telephone Encounter (Signed)
[facility] [phone] Fax: [phone]  

## 2016-01-26 DIAGNOSIS — B351 Tinea unguium: Secondary | ICD-10-CM | POA: Diagnosis not present

## 2016-01-26 DIAGNOSIS — M79674 Pain in right toe(s): Secondary | ICD-10-CM | POA: Diagnosis not present

## 2016-01-26 DIAGNOSIS — I70203 Unspecified atherosclerosis of native arteries of extremities, bilateral legs: Secondary | ICD-10-CM | POA: Diagnosis not present

## 2016-01-26 DIAGNOSIS — E119 Type 2 diabetes mellitus without complications: Secondary | ICD-10-CM | POA: Diagnosis not present

## 2016-01-26 LAB — HM DIABETES FOOT EXAM

## 2016-02-10 DIAGNOSIS — R1312 Dysphagia, oropharyngeal phase: Secondary | ICD-10-CM | POA: Diagnosis not present

## 2016-02-14 DIAGNOSIS — H25813 Combined forms of age-related cataract, bilateral: Secondary | ICD-10-CM | POA: Diagnosis not present

## 2016-02-14 DIAGNOSIS — E119 Type 2 diabetes mellitus without complications: Secondary | ICD-10-CM | POA: Diagnosis not present

## 2016-02-14 LAB — HM DIABETES EYE EXAM

## 2016-02-17 ENCOUNTER — Encounter: Payer: Self-pay | Admitting: Family

## 2016-02-17 ENCOUNTER — Non-Acute Institutional Stay (SKILLED_NURSING_FACILITY): Payer: Medicare Other | Admitting: Family

## 2016-02-17 DIAGNOSIS — E43 Unspecified severe protein-calorie malnutrition: Secondary | ICD-10-CM | POA: Diagnosis not present

## 2016-02-17 DIAGNOSIS — E119 Type 2 diabetes mellitus without complications: Secondary | ICD-10-CM | POA: Diagnosis not present

## 2016-02-17 DIAGNOSIS — K5904 Chronic idiopathic constipation: Secondary | ICD-10-CM | POA: Diagnosis not present

## 2016-02-17 DIAGNOSIS — R1312 Dysphagia, oropharyngeal phase: Secondary | ICD-10-CM | POA: Diagnosis not present

## 2016-02-17 DIAGNOSIS — L89152 Pressure ulcer of sacral region, stage 2: Secondary | ICD-10-CM | POA: Diagnosis not present

## 2016-02-17 NOTE — Progress Notes (Signed)
Location:  [place] Room Number: [id number] Place of Service:  SNF [phone]) Provider: Damico Partin FNP-C   Viviana Simpler, MD  Patient Care Team: Venia Carbon, MD as PCP - General  Extended Emergency Contact Information Primary Emergency Contact: Crowell,Simone  [place] Phone: [phone] Relation: Other Secondary Emergency Contact: [place],James Address: [address], [place] [address] [place] Phone: [phone] Work Phone: [phone] Relation: None  Code Status:  DNR  Goals of care: Advanced Directive information Advanced Directives 09/21/2015  Does Patient Have a Medical Advance Directive? Yes  Type of Advance Directive Out of facility DNR (pink MOST or yellow form)  Does patient want to make changes to medical advance directive? No - Patient declined  Copy of [place] in Chart? Yes     Chief Complaint  Patient presents with  . Medical Management of Chronic Issues    Routine Visit     HPI:  Pt is a 81 y.o. female seen today at [facility]  for medical management of chronic diseases.She has a medical history of Type 2 DM, OA, Breast Cancer, Alzheimer's disease among other conditions. She is seen in her room today.She continues to require total care assistance. Her weight log reviewed has had a 4 pounds weight gain over two months.No recent illness reported. Facility staff reports no new concerns.     Past Medical History:  Diagnosis Date  . Allergy   . Alzheimer's disease 2006  . Arthritis   . Breast cancer ([place])    Dr Grayland Ormond  . Diabetes mellitus   . Sacral decubitus ulcer, stage II 02/2013   Past Surgical History:  Procedure Laterality Date  . ABDOMINAL HYSTERECTOMY  1960's  . MASTECTOMY  2007   Right--with implant    No Known Allergies  Allergies as of 02/17/2016   No Known Allergies     Medication List       Accurate  as of 02/17/16 11:45 AM. Always use your most recent med list.          AMBULATORY NON FORMULARY MEDICATION Med Pass 2.0 Sig: 159ml by mouth three times daily between meals. Document percentage consumed for PCM/Underweight.   bisacodyl 10 MG suppository Commonly known as:  DULCOLAX Place 10 mg rectally. Every 5 days as needed   DECUBI-VITE PO Take 1 tablet by mouth daily.   feeding supplement (PRO-STAT SUGAR FREE 64) Liqd Take 30 mLs by mouth daily. Stop date 03/23/16   hydrocortisone 2.5 % lotion Apply 1 application topically 3 (three) times daily as needed.   polyethylene glycol packet Commonly known as:  MIRALAX / GLYCOLAX Take 17 g by mouth every other day.   traMADol 50 MG tablet Commonly known as:  ULTRAM Take 50 mg by mouth 3 (three) times daily as needed.   zinc oxide 11.3 % Crea cream Commonly known as:  BALMEX Apply to sacral area four times daily until open areas resolve. Apply to sacral area two times daily as needed if redness returns.       Review of Systems  Unable to perform ROS: Dementia    Immunization History  Administered Date(s) Administered  . Influenza Whole 11/03/2008  . PPD Test 08/18/2015, 09/02/2015  . Pneumococcal Polysaccharide-23 10/31/2007  . Td 11/03/2008   Pertinent  Health Maintenance Due  Topic Date Due  . OPHTHALMOLOGY EXAM  08/24/1945  .  URINE MICROALBUMIN  08/24/1945  . DEXA SCAN  08/24/2002  . PNA vac Low Risk Adult (2 of 2 - PCV13) 10/30/2008  . FOOT EXAM  10/31/2013  . INFLUENZA VACCINE  08/03/2015  . HEMOGLOBIN A1C  04/25/2016   Fall Risk  05/28/2014 10/31/2012  Falls in the past year? Exclusion - non ambulatory Exclusion - non ambulatory   Functional Status Survey:    Vitals:   02/17/16 1120  BP: (!) 105/54  Pulse: (!) 57  Resp: 18  Temp: (!) 96.7 F (35.9 C)  TempSrc: Oral  SpO2: 100%  Weight: 79 lb 4.8 oz (36 kg)  Height: 5\' 3"  (1.6 m)   Body mass index is 14.05 kg/m. Physical Exam  Constitutional:  No distress.  Thin elderly  HENT:  Head: Normocephalic.  Mouth/Throat: Oropharynx is clear and moist. No oropharyngeal exudate.  Eyes: Conjunctivae and EOM are normal. Pupils are equal, round, and reactive to light. Left eye exhibits no discharge. No scleral icterus.  Neck: Normal range of motion. No JVD present. No thyromegaly present.  Cardiovascular: Normal rate, regular rhythm, normal heart sounds and intact distal pulses.  Exam reveals no gallop and no friction rub.   No murmur heard. Pulmonary/Chest: Effort normal and breath sounds normal. No respiratory distress. She has no wheezes. She has no rales.  Abdominal: Soft. Bowel sounds are normal. She exhibits no distension. There is no tenderness. There is no rebound and no guarding.  Genitourinary:  Genitourinary Comments: Incontinent  Musculoskeletal: She exhibits no edema, tenderness or deformity.  Bed bound   Lymphadenopathy:    She has no cervical adenopathy.  Neurological: She is alert.  Pleasantly confused at baseline.   Skin: Skin is warm and dry. No rash noted. No erythema. No pallor.  Psychiatric: She has a normal mood and affect.    Labs reviewed:  Recent Labs  08/23/15 08/26/15 08/30/15  NA 141 141 144  K 3.8 3.8 3.5  BUN 11 11 13   CREATININE 0.4* 0.4* 0.4*    Recent Labs  08/23/15  WBC 6.8  HGB 10.7*  HCT 34*  PLT 271   Lab Results  Component Value Date   TSH 1.68 08/23/2015   Lab Results  Component Value Date   HGBA1C 4.9 10/26/2015   HGBA1C 4.9 10/26/2015   Lab Results  Component Value Date   CHOL 231 (A) 08/23/2015   HDL 53 08/23/2015   LDLCALC 161 08/23/2015   TRIG 84 08/23/2015   Assessment/Plan 1. Protein-calorie malnutrition, severe Appetite has improved.Continue on protein supplements and MVI.Weight gain of 4 pounds over two months noted.Continue to follow up with dietician.Recheck BMP 02/18/2016   2. Dementia  No new behavioral issues reported. Continue on MVI and protein  supplements. Continue to assist with ADL's   3. Constipation  Current regimen effective. Continue to encourage hydration and oral intake.  4. Pressure ulcer   Afebrile. Sacral ulcer wound bed red without any signs of infection. Continue on protein supplement to promote wound healing. On alternating low Air mattress. Continue to reposition frequently.     Family/ staff Communication: Reviewed plan of care with facility Nurse supervisor.   Labs/tests ordered: CBC, BMP 02/18/2016

## 2016-02-18 DIAGNOSIS — R1312 Dysphagia, oropharyngeal phase: Secondary | ICD-10-CM | POA: Diagnosis not present

## 2016-02-21 DIAGNOSIS — R1312 Dysphagia, oropharyngeal phase: Secondary | ICD-10-CM | POA: Diagnosis not present

## 2016-02-22 DIAGNOSIS — R1312 Dysphagia, oropharyngeal phase: Secondary | ICD-10-CM | POA: Diagnosis not present

## 2016-02-23 DIAGNOSIS — R1312 Dysphagia, oropharyngeal phase: Secondary | ICD-10-CM | POA: Diagnosis not present

## 2016-02-24 DIAGNOSIS — R1312 Dysphagia, oropharyngeal phase: Secondary | ICD-10-CM | POA: Diagnosis not present

## 2016-02-25 DIAGNOSIS — R1312 Dysphagia, oropharyngeal phase: Secondary | ICD-10-CM | POA: Diagnosis not present

## 2016-02-28 DIAGNOSIS — R1312 Dysphagia, oropharyngeal phase: Secondary | ICD-10-CM | POA: Diagnosis not present

## 2016-02-29 DIAGNOSIS — R1312 Dysphagia, oropharyngeal phase: Secondary | ICD-10-CM | POA: Diagnosis not present

## 2016-03-01 DIAGNOSIS — R1312 Dysphagia, oropharyngeal phase: Secondary | ICD-10-CM | POA: Diagnosis not present

## 2016-03-10 ENCOUNTER — Non-Acute Institutional Stay (SKILLED_NURSING_FACILITY): Payer: Medicare Other | Admitting: Family

## 2016-03-10 ENCOUNTER — Encounter: Payer: Self-pay | Admitting: Family

## 2016-03-10 DIAGNOSIS — F0281 Dementia in other diseases classified elsewhere with behavioral disturbance: Secondary | ICD-10-CM | POA: Diagnosis not present

## 2016-03-10 DIAGNOSIS — K5904 Chronic idiopathic constipation: Secondary | ICD-10-CM

## 2016-03-10 DIAGNOSIS — G308 Other Alzheimer's disease: Secondary | ICD-10-CM

## 2016-03-10 DIAGNOSIS — G309 Alzheimer's disease, unspecified: Secondary | ICD-10-CM

## 2016-03-10 DIAGNOSIS — R634 Abnormal weight loss: Secondary | ICD-10-CM | POA: Diagnosis not present

## 2016-03-10 NOTE — Progress Notes (Signed)
Location:  [facility] Room Number: 804A Place of Service:  SNF [phone]) Provider:  Marlowe Sax, NP  Viviana Simpler, MD  Patient Care Team: Venia Carbon, MD as PCP - General  Extended Emergency Contact Information Primary Emergency Contact: Bottoms,Simone  [place] Phone: [phone] Relation: Other Secondary Emergency Contact: Fabiano,James Address: [address], [place] [address] [place] Phone: [phone] Work Phone: [phone] Relation: None  Code Status:  DNR Goals of care: Advanced Directive information Advanced Directives 03/10/2016  Does Patient Have a Medical Advance Directive? Yes  Type of Advance Directive Out of facility DNR (pink MOST or yellow form)  Does patient want to make changes to medical advance directive? -  Copy of [place] in Chart? -  Pre-existing out of facility DNR order (yellow form or pink MOST form) Yellow form placed in chart (order not valid for inpatient use)     Chief Complaint  Patient presents with  . Medical Management of Chronic Issues    routine visit    HPI:  Pt is a 81 y.o. female seen today at [facility]  for medical management of chronic diseases.She has a medical history of Alzheimer's disease, Breast Cancer, OA among other conditions. She is seen in her room today. She is pleasantly confused unable to provider HPI and ROS. She has had a 5 pounds weight loss over one month. She was seen recently by Registered Dietician large portion meals ordered. No recent acute illness or fall episodes reported.       Past Medical History:  Diagnosis Date  . Allergy   . Alzheimer's disease 2006  . Arthritis   . Breast cancer ([place])    Dr Grayland Ormond  . Diabetes mellitus   . Sacral decubitus ulcer, stage II 02/2013   Past Surgical History:  Procedure Laterality Date  . ABDOMINAL HYSTERECTOMY  1960's  . MASTECTOMY   2007   Right--with implant    No Known Allergies  Allergies as of 03/10/2016   No Known Allergies     Medication List       Accurate as of 03/10/16 11:35 AM. Always use your most recent med list.          AMBULATORY NON FORMULARY MEDICATION Med Pass 2.0 Sig: 144ml by mouth three times daily between meals. Document percentage consumed for PCM/Underweight.   bisacodyl 10 MG suppository Commonly known as:  DULCOLAX Place 10 mg rectally. Every 5 days as needed   feeding supplement (PRO-STAT SUGAR FREE 64) Liqd Take 30 mLs by mouth daily. Stop date 03/23/16   hydrocortisone 2.5 % lotion Apply 1 application topically 3 (three) times daily as needed.   multivitamin tablet Take 1 tablet by mouth daily.   polyethylene glycol packet Commonly known as:  MIRALAX / GLYCOLAX Take 17 g by mouth every other day.   traMADol 50 MG tablet Commonly known as:  ULTRAM Take 50 mg by mouth 3 (three) times daily as needed.   zinc oxide 11.3 % Crea cream Commonly known as:  BALMEX Apply to sacral area four times daily until open areas resolve. Apply to sacral area two times daily as needed if redness returns.       Review of Systems  Unable to perform ROS: Dementia    Immunization History  Administered Date(s) Administered  . Influenza Whole 11/03/2008  . PPD Test  08/18/2015, 09/02/2015  . Pneumococcal Polysaccharide-23 10/31/2007  . Td 11/03/2008   Pertinent  Health Maintenance Due  Topic Date Due  . OPHTHALMOLOGY EXAM  08/24/1945  . URINE MICROALBUMIN  08/24/1945  . DEXA SCAN  08/24/2002  . PNA vac Low Risk Adult (2 of 2 - PCV13) 10/30/2008  . FOOT EXAM  10/31/2013  . INFLUENZA VACCINE  08/03/2015  . HEMOGLOBIN A1C  04/25/2016   Fall Risk  05/28/2014 10/31/2012  Falls in the past year? Exclusion - non ambulatory Exclusion - non ambulatory   Functional Status Survey:    Vitals:   03/10/16 1123  BP: 128/68  Pulse: 76  Resp: 20  Temp: 97.2 F (36.2 C)  SpO2: 93%    Weight: 74 lb (33.6 kg)  Height: 5\' 3"  (1.6 m)   Body mass index is 13.11 kg/m. Physical Exam  Constitutional: No distress.  Thin elderly smiles at provider but unable to follow simple commands during visit.   HENT:  Head: Normocephalic.  Mouth/Throat: Oropharynx is clear and moist. No oropharyngeal exudate.  Eyes: Conjunctivae and EOM are normal. Pupils are equal, round, and reactive to light. Left eye exhibits no discharge. No scleral icterus.  Neck: Normal range of motion. No JVD present. No thyromegaly present.  Cardiovascular: Normal rate, regular rhythm, normal heart sounds and intact distal pulses.  Exam reveals no gallop and no friction rub.   No murmur heard. Pulmonary/Chest: Effort normal and breath sounds normal. No respiratory distress. She has no wheezes. She has no rales.  Abdominal: Soft. Bowel sounds are normal. She exhibits no distension. There is no tenderness. There is no rebound and no guarding.  Genitourinary:  Genitourinary Comments: Incontinent  Musculoskeletal: She exhibits no edema, tenderness or deformity.  Bed/wheelchair bound   Lymphadenopathy:    She has no cervical adenopathy.  Neurological: She is alert.  Pleasantly confused at baseline.   Skin: Skin is warm and dry. No rash noted. No erythema. No pallor.  Psychiatric: She has a normal mood and affect.    Labs reviewed:  Recent Labs  08/23/15 08/26/15 08/30/15  NA 141 141 144  K 3.8 3.8 3.5  BUN 11 11 13   CREATININE 0.4* 0.4* 0.4*    Recent Labs  08/30/15  AST 21  ALT 55*  ALKPHOS 68    Recent Labs  08/23/15  WBC 6.8  HGB 10.7*  HCT 34*  PLT 271   Lab Results  Component Value Date   TSH 1.68 08/23/2015   Lab Results  Component Value Date   HGBA1C 4.9 10/26/2015   HGBA1C 4.9 10/26/2015   Lab Results  Component Value Date   CHOL 231 (A) 08/23/2015   HDL 53 08/23/2015   LDLCALC 161 08/23/2015   TRIG 84 08/23/2015   Assessment/Plan  Weight loss  Has had 5 pounds  weight loss. Continue to follow up with Registered Dietician double portion meals recently ordered. Continue to monitor weight.   Alzheimer's Dementia No new behavioral changes reported. Continue to assist with ADL's.Skin care.   Constipation   current regimen effective. Continue to encourage oral and fluid intake.    Family/ staff Communication:Reviewed plan of care with facility Nurse supervisor.   Labs/tests ordered: None

## 2016-03-16 DIAGNOSIS — N390 Urinary tract infection, site not specified: Secondary | ICD-10-CM | POA: Diagnosis not present

## 2016-04-12 ENCOUNTER — Non-Acute Institutional Stay (SKILLED_NURSING_FACILITY): Payer: Medicare Other | Admitting: Family

## 2016-04-12 ENCOUNTER — Encounter: Payer: Self-pay | Admitting: Family

## 2016-04-12 DIAGNOSIS — E119 Type 2 diabetes mellitus without complications: Secondary | ICD-10-CM | POA: Diagnosis not present

## 2016-04-12 DIAGNOSIS — M170 Bilateral primary osteoarthritis of knee: Secondary | ICD-10-CM | POA: Diagnosis not present

## 2016-04-12 DIAGNOSIS — K5904 Chronic idiopathic constipation: Secondary | ICD-10-CM

## 2016-04-12 NOTE — Progress Notes (Signed)
Location:  [facility] Room Number: 527-P Place of Service:  SNF [phone]) Provider:  Marlowe Sax, NP  Viviana Simpler, MD  Patient Care Team: Venia Carbon, MD as PCP - General  Extended Emergency Contact Information Primary Emergency Contact: Shiffman,Simone  [place] Phone: [phone] Relation: Other Secondary Emergency Contact: Sayre,James Address: [address]          [place], [place] [address] [place] Phone: [phone] Work Phone: [phone] Relation: None  Code Status:  DNR Goals of care: Advanced Directive information Advanced Directives 03/10/2016  Does Patient Have a Medical Advance Directive? Yes  Type of Advance Directive Out of facility DNR (pink MOST or yellow form)  Does patient want to make changes to medical advance directive? -  Copy of [place] in Chart? -  Pre-existing out of facility DNR order (yellow form or pink MOST form) Yellow form placed in chart (order not valid for inpatient use)     Chief Complaint  Patient presents with  . Medical Management of Chronic Issues    Routine Visit    HPI:  Pt is a 81 y.o. female seen today at [facility]  for medical management of chronic diseases.She has a medical history of Alzheimer's disease, Breast Cancer, OA among other conditions. She is seen in her room today. She has had nor recent acute illnesses since last visit. No weight changes.She is pleasantly confused unable to provider HPI and ROS. Facility Nurse reports no new concerns.No skin breakdown.   Past Medical History:  Diagnosis Date  . Allergy   . Alzheimer's disease 2006  . Arthritis   . Breast cancer ([place])    Dr Grayland Ormond  . Diabetes mellitus   . Sacral decubitus ulcer, stage II 02/2013   Past Surgical History:  Procedure Laterality Date  . ABDOMINAL HYSTERECTOMY  1960's  . MASTECTOMY  2007   Right--with implant    No Known  Allergies  Allergies as of 04/12/2016   No Known Allergies     Medication List       Accurate as of 04/12/16  9:35 PM. Always use your most recent med list.          AMBULATORY NON FORMULARY MEDICATION Med Pass 2.0 Sig: 168ml by mouth three times daily between meals. Document percentage consumed for PCM/Underweight.   bisacodyl 10 MG suppository Commonly known as:  DULCOLAX Place 10 mg rectally. Every 5 days as needed   hydrocortisone 2.5 % lotion Apply 1 application topically 3 (three) times daily as needed.   multivitamin tablet Take 1 tablet by mouth daily.   polyethylene glycol packet Commonly known as:  MIRALAX / GLYCOLAX Take 17 g by mouth every other day.   traMADol 50 MG tablet Commonly known as:  ULTRAM Take 50 mg by mouth 3 (three) times daily as needed.   zinc oxide 11.3 % Crea cream Commonly known as:  BALMEX Apply to sacral area four times daily until open areas resolve. Apply to sacral area two times daily as needed if redness returns.       Review of Systems  Unable to perform ROS: Dementia    Immunization History  Administered Date(s) Administered  . Influenza Whole 11/03/2008  . PPD Test 08/18/2015, 09/02/2015  . Pneumococcal Polysaccharide-23 10/31/2007  . Td 11/03/2008   Pertinent  Health Maintenance Due  Topic Date Due  . URINE MICROALBUMIN  08/24/1945  . DEXA  SCAN  01/02/2017 (Originally 08/24/2002)  . PNA vac Low Risk Adult (2 of 2 - PCV13) 01/02/2017 (Originally 10/30/2008)  . HEMOGLOBIN A1C  04/25/2016  . INFLUENZA VACCINE  08/02/2016  . FOOT EXAM  01/25/2017  . OPHTHALMOLOGY EXAM  02/13/2017   Fall Risk  05/28/2014 10/31/2012  Falls in the past year? Exclusion - non ambulatory Exclusion - non ambulatory    Vitals:   04/12/16 0940  BP: 100/68  Pulse: 60  Resp: 18  Temp: 97.7 F (36.5 C)  TempSrc: Oral  SpO2: 97%  Weight: 78 lb (35.4 kg)  Height: 5\' 3"  (1.6 m)   Body mass index is 13.82 kg/m. Physical Exam    Constitutional: No distress.  Thin elderly pleasantly confused at baseline   HENT:  Head: Normocephalic.  Mouth/Throat: Oropharynx is clear and moist. No oropharyngeal exudate.  Eyes: Conjunctivae and EOM are normal. Pupils are equal, round, and reactive to light. Left eye exhibits no discharge. No scleral icterus.  Neck: Normal range of motion. No JVD present. No thyromegaly present.  Cardiovascular: Normal rate, regular rhythm, normal heart sounds and intact distal pulses.  Exam reveals no gallop and no friction rub.   No murmur heard. Pulmonary/Chest: Effort normal and breath sounds normal. No respiratory distress. She has no wheezes. She has no rales.  Abdominal: Soft. Bowel sounds are normal. She exhibits no distension. There is no tenderness. There is no rebound and no guarding.  Genitourinary:  Genitourinary Comments: Incontinent for bladder and bowel.  Musculoskeletal: She exhibits no edema, tenderness or deformity.  Bed/wheelchair bound   Lymphadenopathy:    She has no cervical adenopathy.  Neurological: She is alert.  Pleasantly confused at baseline.   Skin: Skin is warm and dry. No rash noted. No erythema. No pallor.  Psychiatric: She has a normal mood and affect.    Labs reviewed:  Recent Labs  08/23/15 08/26/15 08/30/15  NA 141 141 144  K 3.8 3.8 3.5  BUN 11 11 13   CREATININE 0.4* 0.4* 0.4*    Recent Labs  08/30/15  AST 21  ALT 55*  ALKPHOS 68    Recent Labs  08/23/15  WBC 6.8  HGB 10.7*  HCT 34*  PLT 271   Lab Results  Component Value Date   TSH 1.68 08/23/2015   Lab Results  Component Value Date   HGBA1C 4.9 10/26/2015   HGBA1C 4.9 10/26/2015   Lab Results  Component Value Date   CHOL 231 (A) 08/23/2015   HDL 53 08/23/2015   LDLCALC 161 08/23/2015   TRIG 84 08/23/2015   Assessment/Plan Osteoarthritis  No signs of pain noted during visit.continue current pain regimen  Type 2 DM Diet control.No signs of hypo/hyperglycemia. Last Hgb  A1C 4.9 continue on CCD and snacks. Will check Hgb A1C annually.    constipation  Current regimen effective.Continue to encourage oral intake and fluid.  Family/ staff Communication:Reviewed plan of care with facility Nurse supervisor.   Labs/tests ordered: None

## 2016-04-14 DIAGNOSIS — G894 Chronic pain syndrome: Secondary | ICD-10-CM | POA: Diagnosis not present

## 2016-04-17 DIAGNOSIS — Z79899 Other long term (current) drug therapy: Secondary | ICD-10-CM | POA: Diagnosis not present

## 2016-04-17 LAB — BASIC METABOLIC PANEL
BUN: 12 mg/dL (ref 4–21)
CREATININE: 0.5 mg/dL (ref 0.5–1.1)
Glucose: 72 mg/dL
POTASSIUM: 3.7 mmol/L (ref 3.4–5.3)
Sodium: 144 mmol/L (ref 137–147)

## 2016-04-17 LAB — LIPID PANEL
CHOLESTEROL: 197 mg/dL (ref 0–200)
HDL: 67 mg/dL (ref 35–70)
LDL Cholesterol: 106 mg/dL
Triglycerides: 118 mg/dL (ref 40–160)

## 2016-04-17 LAB — TSH: TSH: 1.49 u[IU]/mL (ref 0.41–5.90)

## 2016-04-17 LAB — CBC AND DIFFERENTIAL
HCT: 37 % (ref 36–46)
Hemoglobin: 12.4 g/dL (ref 12.0–16.0)
Platelets: 234 10*3/uL (ref 150–399)
WBC: 6.5 10*3/mL

## 2016-04-17 LAB — HEPATIC FUNCTION PANEL
ALT: 91 U/L — AB (ref 7–35)
AST: 38 U/L — AB (ref 13–35)
Alkaline Phosphatase: 79 U/L (ref 25–125)
BILIRUBIN, TOTAL: 0.3 mg/dL

## 2016-04-17 LAB — HEMOGLOBIN A1C: HEMOGLOBIN A1C: 5.7

## 2016-04-26 DIAGNOSIS — G894 Chronic pain syndrome: Secondary | ICD-10-CM | POA: Diagnosis not present

## 2016-05-15 ENCOUNTER — Non-Acute Institutional Stay (SKILLED_NURSING_FACILITY): Payer: Medicare Other | Admitting: Internal Medicine

## 2016-05-15 ENCOUNTER — Encounter: Payer: Self-pay | Admitting: Internal Medicine

## 2016-05-15 DIAGNOSIS — F458 Other somatoform disorders: Secondary | ICD-10-CM | POA: Diagnosis not present

## 2016-05-15 DIAGNOSIS — M24569 Contracture, unspecified knee: Secondary | ICD-10-CM | POA: Diagnosis not present

## 2016-05-15 DIAGNOSIS — Z872 Personal history of diseases of the skin and subcutaneous tissue: Secondary | ICD-10-CM | POA: Diagnosis not present

## 2016-05-15 DIAGNOSIS — G309 Alzheimer's disease, unspecified: Secondary | ICD-10-CM

## 2016-05-15 DIAGNOSIS — R1312 Dysphagia, oropharyngeal phase: Secondary | ICD-10-CM

## 2016-05-15 DIAGNOSIS — K5909 Other constipation: Secondary | ICD-10-CM | POA: Diagnosis not present

## 2016-05-15 DIAGNOSIS — E43 Unspecified severe protein-calorie malnutrition: Secondary | ICD-10-CM | POA: Diagnosis not present

## 2016-05-15 DIAGNOSIS — F0280 Dementia in other diseases classified elsewhere without behavioral disturbance: Secondary | ICD-10-CM

## 2016-05-15 NOTE — Progress Notes (Signed)
LOCATION:  [facility]  PCP: Venia Carbon, MD   Code Status: DNR  Goals of care: Advanced Directive information Advanced Directives 03/10/2016  Does Patient Have a Medical Advance Directive? Yes  Type of Advance Directive Out of facility DNR (pink MOST or yellow form)  Does patient want to make changes to medical advance directive? -  Copy of [place] in Chart? -  Pre-existing out of facility DNR order (yellow form or pink MOST form) Yellow form placed in chart (order not valid for inpatient use)       Extended Emergency Contact Information Primary Emergency Contact: Rieman,Simone  [place] Phone: [phone] Relation: Other Secondary Emergency Contact: Sadowsky,James Address: [address]          [place], [place] [address] [place] Phone: [phone] Work Phone: [phone] Relation: None   No Known Allergies  Chief Complaint  Patient presents with  . Medical Management of Chronic Issues    Routine Visit      HPI:  Patient is a 81 y.o. female seen today for routine visit. She is under total care. No fall reported. Has been taking her medication. No pressure ulcer sore has been reported by nursing staff. She does not participate in HPI and ROS.   Review of Systems: unable to obtain   Past Medical History:  Diagnosis Date  . Allergy   . Alzheimer's disease 2006  . Arthritis   . Breast cancer ([place])    Dr Grayland Ormond  . Diabetes mellitus   . Sacral decubitus ulcer, stage II 02/2013   Past Surgical History:  Procedure Laterality Date  . ABDOMINAL HYSTERECTOMY  1960's  . MASTECTOMY  2007   Right--with implant   Social History:   reports that she has never smoked. She has never used smokeless tobacco. She reports that she does not drink alcohol or use drugs.  Family History  Problem Relation Age of Onset  . Diabetes Mother     Medications: Allergies as of 05/15/2016   No Known  Allergies     Medication List       Accurate as of 05/15/16 12:44 PM. Always use your most recent med list.          AMBULATORY NON FORMULARY MEDICATION Med Pass 2.0 Sig: 119ml by mouth three times daily between meals. Document percentage consumed for PCM/Underweight.   bisacodyl 10 MG suppository Commonly known as:  DULCOLAX Place 10 mg rectally. Every 5 days as needed   hydrocortisone 2.5 % lotion Apply 1 application topically 3 (three) times daily as needed.   multivitamin tablet Take 1 tablet by mouth daily.   polyethylene glycol packet Commonly known as:  MIRALAX / GLYCOLAX Take 17 g by mouth every other day.   traMADol 50 MG tablet Commonly known as:  ULTRAM Take 50 mg by mouth 3 (three) times daily as needed.   zinc oxide 11.3 % Crea cream Commonly known as:  BALMEX Apply to sacral area four times daily until open areas resolve. Apply to sacral area two times daily as needed if redness returns.       Immunizations: Immunization History  Administered Date(s) Administered  . Influenza Whole 11/03/2008  . PPD Test 08/18/2015, 09/02/2015  . Pneumococcal Polysaccharide-23 10/31/2007  . Td 11/03/2008     Physical Exam:  Vitals:   05/15/16 1234  BP: 118/68  Pulse: 72  Resp: 18  Temp: 98 F (36.7 C)  TempSrc:  Oral  SpO2: 93%  Weight: 78 lb 12.8 oz (35.7 kg)  Height: 5\' 3"  (1.6 m)  Body mass index is 13.96 kg/m.   General- elderly female, frail and thin built, in no acute distress Head- normocephalic, atraumatic Throat- moist mucus membrane, bruxism + Eyes- no pallor, no icterus, no discharge Neck- no cervical lymphadenopathy Cardiovascular- normal s1,s2, no murmur Respiratory- bilateral clear to auscultation Abdomen- bowel sounds present, soft, non tender Musculoskeletal- generalized weakness, on gerichair Skin- warm and dry    Labs reviewed: CBC    Component Value Date/Time   WBC 6.5 04/17/2016   WBC 9.4 07/08/2012 0817   WBC 12.1  (H) 12/01/2009 1304   RBC 4.04 07/08/2012 0817   RBC 4.99 12/01/2009 1304   HGB 12.4 04/17/2016   HGB 11.9 (L) 07/08/2012 0817   HCT 37 04/17/2016   HCT 35.9 07/08/2012 0817   PLT 234 04/17/2016   PLT 237 07/08/2012 0817   MCV 89 07/08/2012 0817   MCH 29.4 07/08/2012 0817   MCHC 33.0 07/08/2012 0817   MCHC 33.2 12/01/2009 1304   RDW 13.6 07/08/2012 0817   LYMPHSABS 3.9 12/01/2009 1304   MONOABS 0.8 12/01/2009 1304   EOSABS 0.1 12/01/2009 1304   BASOSABS 0.0 12/01/2009 1304   CMP Latest Ref Rng & Units 04/17/2016 08/30/2015 08/26/2015  Glucose 65 - 99 mg/dL - - -  BUN 4 - 21 mg/dL 12 13 11   Creatinine 0.5 - 1.1 mg/dL 0.5 0.4(A) 0.4(A)  Sodium 137 - 147 mmol/L 144 144 141  Potassium 3.4 - 5.3 mmol/L 3.7 3.5 3.8  Chloride 98 - 107 mmol/L - - -  CO2 21 - 32 mmol/L - - -  Calcium 8.5 - 10.1 mg/dL - - -  Total Protein 6.4 - 8.2 g/dL - - -  Total Bilirubin 0.2 - 1.0 mg/dL - - -  Alkaline Phos 25 - 125 U/L 79 68 -  AST 13 - 35 U/L 38(A) 21 -  ALT 7 - 35 U/L 91(A) 55(A) -     Assessment/Plan  Dysphagia Needs aspiration precautions. No respiratory problem per nursing. Continue puree diet with thin liquids. Assistance with feeding  Contracture to limbs Supportive care, continue tramadil 50 mg tid prn and monitor  Alzheimer's dementia without behavioral disturbance Provide total care. Fall precautions and pressure ulcer prophylaxis to be taken.  Severe protein calorie malnutrition Continue med Pass supplement, declining anticipated with her advanced dementia.   Chronic constipation On miralax qod, monitor. Continue prn bisacodyl suppository   History of pressure ulcer Provide preventative dressing. Continue with skin care. Low air loss mattress to prevent wound.   Bruxism Chronic, likely related to her progressed dementia. No trouble taking her meds and food. PMR consult for evaluation for possible botox injection to help with this.    Labs/tests ordered:  None   Family/ staff Communication: reviewed care plan with patient's nursing supervisor    Blanchie Serve, MD Internal Medicine [place], [place] [address] Cell Phone (Monday-Friday 8 am - 5 pm): [phone] On Call: [phone] and follow prompts after 5 pm and on weekends Office Phone: [phone] Office Fax: [phone]

## 2016-07-27 DIAGNOSIS — G301 Alzheimer's disease with late onset: Secondary | ICD-10-CM | POA: Diagnosis not present

## 2016-07-27 DIAGNOSIS — E119 Type 2 diabetes mellitus without complications: Secondary | ICD-10-CM | POA: Diagnosis not present

## 2016-07-27 DIAGNOSIS — M1990 Unspecified osteoarthritis, unspecified site: Secondary | ICD-10-CM | POA: Diagnosis not present

## 2016-07-28 DIAGNOSIS — R32 Unspecified urinary incontinence: Secondary | ICD-10-CM | POA: Diagnosis not present

## 2016-07-28 DIAGNOSIS — Z7409 Other reduced mobility: Secondary | ICD-10-CM | POA: Diagnosis not present

## 2016-07-28 DIAGNOSIS — M1990 Unspecified osteoarthritis, unspecified site: Secondary | ICD-10-CM | POA: Diagnosis not present

## 2016-07-28 DIAGNOSIS — E119 Type 2 diabetes mellitus without complications: Secondary | ICD-10-CM | POA: Diagnosis not present

## 2016-08-28 DIAGNOSIS — G301 Alzheimer's disease with late onset: Secondary | ICD-10-CM | POA: Diagnosis not present

## 2016-08-28 DIAGNOSIS — R32 Unspecified urinary incontinence: Secondary | ICD-10-CM | POA: Diagnosis not present

## 2016-08-28 DIAGNOSIS — M1990 Unspecified osteoarthritis, unspecified site: Secondary | ICD-10-CM | POA: Diagnosis not present

## 2016-08-28 DIAGNOSIS — Z7409 Other reduced mobility: Secondary | ICD-10-CM | POA: Diagnosis not present

## 2016-09-22 DIAGNOSIS — F0280 Dementia in other diseases classified elsewhere without behavioral disturbance: Secondary | ICD-10-CM | POA: Diagnosis not present

## 2016-09-22 DIAGNOSIS — M1990 Unspecified osteoarthritis, unspecified site: Secondary | ICD-10-CM | POA: Diagnosis not present

## 2016-09-22 DIAGNOSIS — Z7409 Other reduced mobility: Secondary | ICD-10-CM | POA: Diagnosis not present

## 2016-09-22 DIAGNOSIS — R32 Unspecified urinary incontinence: Secondary | ICD-10-CM | POA: Diagnosis not present

## 2016-10-16 DIAGNOSIS — G301 Alzheimer's disease with late onset: Secondary | ICD-10-CM | POA: Diagnosis not present

## 2016-10-16 DIAGNOSIS — E119 Type 2 diabetes mellitus without complications: Secondary | ICD-10-CM | POA: Diagnosis not present

## 2016-10-27 DIAGNOSIS — M1990 Unspecified osteoarthritis, unspecified site: Secondary | ICD-10-CM | POA: Diagnosis not present

## 2016-10-27 DIAGNOSIS — R32 Unspecified urinary incontinence: Secondary | ICD-10-CM | POA: Diagnosis not present

## 2016-10-27 DIAGNOSIS — Z7409 Other reduced mobility: Secondary | ICD-10-CM | POA: Diagnosis not present

## 2016-10-27 DIAGNOSIS — F0280 Dementia in other diseases classified elsewhere without behavioral disturbance: Secondary | ICD-10-CM | POA: Diagnosis not present

## 2016-11-08 DIAGNOSIS — M1990 Unspecified osteoarthritis, unspecified site: Secondary | ICD-10-CM | POA: Diagnosis not present

## 2016-11-08 DIAGNOSIS — F0280 Dementia in other diseases classified elsewhere without behavioral disturbance: Secondary | ICD-10-CM | POA: Diagnosis not present

## 2016-11-23 DIAGNOSIS — R634 Abnormal weight loss: Secondary | ICD-10-CM | POA: Diagnosis not present

## 2016-11-23 DIAGNOSIS — G301 Alzheimer's disease with late onset: Secondary | ICD-10-CM | POA: Diagnosis not present

## 2016-11-29 DIAGNOSIS — Z79899 Other long term (current) drug therapy: Secondary | ICD-10-CM | POA: Diagnosis not present

## 2017-01-19 DIAGNOSIS — R293 Abnormal posture: Secondary | ICD-10-CM | POA: Diagnosis not present

## 2017-01-19 DIAGNOSIS — M6281 Muscle weakness (generalized): Secondary | ICD-10-CM | POA: Diagnosis not present

## 2017-01-22 DIAGNOSIS — R293 Abnormal posture: Secondary | ICD-10-CM | POA: Diagnosis not present

## 2017-01-22 DIAGNOSIS — M6281 Muscle weakness (generalized): Secondary | ICD-10-CM | POA: Diagnosis not present

## 2017-01-24 DIAGNOSIS — Z7984 Long term (current) use of oral hypoglycemic drugs: Secondary | ICD-10-CM | POA: Diagnosis not present

## 2017-01-24 DIAGNOSIS — B351 Tinea unguium: Secondary | ICD-10-CM | POA: Diagnosis not present

## 2017-01-24 DIAGNOSIS — M2011 Hallux valgus (acquired), right foot: Secondary | ICD-10-CM | POA: Diagnosis not present

## 2017-01-24 DIAGNOSIS — E1140 Type 2 diabetes mellitus with diabetic neuropathy, unspecified: Secondary | ICD-10-CM | POA: Diagnosis not present

## 2017-02-27 DIAGNOSIS — Z9181 History of falling: Secondary | ICD-10-CM | POA: Diagnosis not present

## 2017-02-27 DIAGNOSIS — F0280 Dementia in other diseases classified elsewhere without behavioral disturbance: Secondary | ICD-10-CM | POA: Diagnosis not present

## 2017-03-12 DIAGNOSIS — L89620 Pressure ulcer of left heel, unstageable: Secondary | ICD-10-CM | POA: Diagnosis not present

## 2017-03-19 DIAGNOSIS — R634 Abnormal weight loss: Secondary | ICD-10-CM | POA: Diagnosis not present

## 2017-03-19 DIAGNOSIS — L89620 Pressure ulcer of left heel, unstageable: Secondary | ICD-10-CM | POA: Diagnosis not present

## 2017-03-19 DIAGNOSIS — G301 Alzheimer's disease with late onset: Secondary | ICD-10-CM | POA: Diagnosis not present

## 2017-04-02 DIAGNOSIS — L89620 Pressure ulcer of left heel, unstageable: Secondary | ICD-10-CM | POA: Diagnosis not present

## 2017-04-25 DIAGNOSIS — R5381 Other malaise: Secondary | ICD-10-CM | POA: Diagnosis not present

## 2017-04-25 DIAGNOSIS — R634 Abnormal weight loss: Secondary | ICD-10-CM | POA: Diagnosis not present

## 2017-04-25 DIAGNOSIS — E119 Type 2 diabetes mellitus without complications: Secondary | ICD-10-CM | POA: Diagnosis not present

## 2017-04-25 DIAGNOSIS — G301 Alzheimer's disease with late onset: Secondary | ICD-10-CM | POA: Diagnosis not present

## 2017-06-21 DIAGNOSIS — G301 Alzheimer's disease with late onset: Secondary | ICD-10-CM | POA: Diagnosis not present

## 2017-06-21 DIAGNOSIS — R634 Abnormal weight loss: Secondary | ICD-10-CM | POA: Diagnosis not present

## 2017-06-21 DIAGNOSIS — E119 Type 2 diabetes mellitus without complications: Secondary | ICD-10-CM | POA: Diagnosis not present

## 2017-06-21 DIAGNOSIS — R5381 Other malaise: Secondary | ICD-10-CM | POA: Diagnosis not present

## 2017-07-31 DIAGNOSIS — L989 Disorder of the skin and subcutaneous tissue, unspecified: Secondary | ICD-10-CM | POA: Diagnosis not present

## 2017-08-30 DIAGNOSIS — R634 Abnormal weight loss: Secondary | ICD-10-CM | POA: Diagnosis not present

## 2017-08-30 DIAGNOSIS — G301 Alzheimer's disease with late onset: Secondary | ICD-10-CM | POA: Diagnosis not present

## 2017-08-30 DIAGNOSIS — M1990 Unspecified osteoarthritis, unspecified site: Secondary | ICD-10-CM | POA: Diagnosis not present

## 2017-08-30 DIAGNOSIS — E119 Type 2 diabetes mellitus without complications: Secondary | ICD-10-CM | POA: Diagnosis not present

## 2017-08-31 DIAGNOSIS — Z79899 Other long term (current) drug therapy: Secondary | ICD-10-CM | POA: Diagnosis not present

## 2017-10-18 DIAGNOSIS — M1990 Unspecified osteoarthritis, unspecified site: Secondary | ICD-10-CM | POA: Diagnosis not present

## 2017-10-18 DIAGNOSIS — R634 Abnormal weight loss: Secondary | ICD-10-CM | POA: Diagnosis not present

## 2017-10-18 DIAGNOSIS — G301 Alzheimer's disease with late onset: Secondary | ICD-10-CM | POA: Diagnosis not present

## 2017-10-18 DIAGNOSIS — E119 Type 2 diabetes mellitus without complications: Secondary | ICD-10-CM | POA: Diagnosis not present

## 2017-11-15 DIAGNOSIS — Z79899 Other long term (current) drug therapy: Secondary | ICD-10-CM | POA: Diagnosis not present

## 2017-11-15 DIAGNOSIS — E039 Hypothyroidism, unspecified: Secondary | ICD-10-CM | POA: Diagnosis not present

## 2017-12-17 DIAGNOSIS — B029 Zoster without complications: Secondary | ICD-10-CM | POA: Diagnosis not present

## 2017-12-17 DIAGNOSIS — G301 Alzheimer's disease with late onset: Secondary | ICD-10-CM | POA: Diagnosis not present

## 2017-12-17 DIAGNOSIS — F0280 Dementia in other diseases classified elsewhere without behavioral disturbance: Secondary | ICD-10-CM | POA: Diagnosis not present

## 2017-12-17 DIAGNOSIS — M1990 Unspecified osteoarthritis, unspecified site: Secondary | ICD-10-CM | POA: Diagnosis not present

## 2018-01-15 DIAGNOSIS — L603 Nail dystrophy: Secondary | ICD-10-CM | POA: Diagnosis not present

## 2018-01-15 DIAGNOSIS — E1140 Type 2 diabetes mellitus with diabetic neuropathy, unspecified: Secondary | ICD-10-CM | POA: Diagnosis not present

## 2018-01-15 DIAGNOSIS — I739 Peripheral vascular disease, unspecified: Secondary | ICD-10-CM | POA: Diagnosis not present

## 2018-01-15 DIAGNOSIS — B351 Tinea unguium: Secondary | ICD-10-CM | POA: Diagnosis not present

## 2018-02-18 DIAGNOSIS — E785 Hyperlipidemia, unspecified: Secondary | ICD-10-CM | POA: Diagnosis not present

## 2018-02-18 DIAGNOSIS — Z7409 Other reduced mobility: Secondary | ICD-10-CM | POA: Diagnosis not present

## 2018-02-18 DIAGNOSIS — G301 Alzheimer's disease with late onset: Secondary | ICD-10-CM | POA: Diagnosis not present

## 2018-02-18 DIAGNOSIS — R634 Abnormal weight loss: Secondary | ICD-10-CM | POA: Diagnosis not present

## 2018-03-19 DIAGNOSIS — M6281 Muscle weakness (generalized): Secondary | ICD-10-CM | POA: Diagnosis not present

## 2018-03-19 DIAGNOSIS — L989 Disorder of the skin and subcutaneous tissue, unspecified: Secondary | ICD-10-CM | POA: Diagnosis not present

## 2018-03-19 DIAGNOSIS — R293 Abnormal posture: Secondary | ICD-10-CM | POA: Diagnosis not present

## 2018-03-20 DIAGNOSIS — L989 Disorder of the skin and subcutaneous tissue, unspecified: Secondary | ICD-10-CM | POA: Diagnosis not present

## 2018-03-20 DIAGNOSIS — R293 Abnormal posture: Secondary | ICD-10-CM | POA: Diagnosis not present

## 2018-03-20 DIAGNOSIS — M6281 Muscle weakness (generalized): Secondary | ICD-10-CM | POA: Diagnosis not present

## 2018-03-21 DIAGNOSIS — R293 Abnormal posture: Secondary | ICD-10-CM | POA: Diagnosis not present

## 2018-03-21 DIAGNOSIS — L989 Disorder of the skin and subcutaneous tissue, unspecified: Secondary | ICD-10-CM | POA: Diagnosis not present

## 2018-03-21 DIAGNOSIS — M6281 Muscle weakness (generalized): Secondary | ICD-10-CM | POA: Diagnosis not present

## 2018-03-22 DIAGNOSIS — M6281 Muscle weakness (generalized): Secondary | ICD-10-CM | POA: Diagnosis not present

## 2018-03-22 DIAGNOSIS — L989 Disorder of the skin and subcutaneous tissue, unspecified: Secondary | ICD-10-CM | POA: Diagnosis not present

## 2018-03-22 DIAGNOSIS — R293 Abnormal posture: Secondary | ICD-10-CM | POA: Diagnosis not present

## 2018-03-25 DIAGNOSIS — L989 Disorder of the skin and subcutaneous tissue, unspecified: Secondary | ICD-10-CM | POA: Diagnosis not present

## 2018-03-25 DIAGNOSIS — R293 Abnormal posture: Secondary | ICD-10-CM | POA: Diagnosis not present

## 2018-03-25 DIAGNOSIS — M6281 Muscle weakness (generalized): Secondary | ICD-10-CM | POA: Diagnosis not present

## 2018-03-26 DIAGNOSIS — M6281 Muscle weakness (generalized): Secondary | ICD-10-CM | POA: Diagnosis not present

## 2018-03-26 DIAGNOSIS — L989 Disorder of the skin and subcutaneous tissue, unspecified: Secondary | ICD-10-CM | POA: Diagnosis not present

## 2018-03-26 DIAGNOSIS — R293 Abnormal posture: Secondary | ICD-10-CM | POA: Diagnosis not present

## 2018-03-27 DIAGNOSIS — L989 Disorder of the skin and subcutaneous tissue, unspecified: Secondary | ICD-10-CM | POA: Diagnosis not present

## 2018-03-27 DIAGNOSIS — R293 Abnormal posture: Secondary | ICD-10-CM | POA: Diagnosis not present

## 2018-03-27 DIAGNOSIS — M6281 Muscle weakness (generalized): Secondary | ICD-10-CM | POA: Diagnosis not present

## 2018-03-28 DIAGNOSIS — M6281 Muscle weakness (generalized): Secondary | ICD-10-CM | POA: Diagnosis not present

## 2018-03-28 DIAGNOSIS — R293 Abnormal posture: Secondary | ICD-10-CM | POA: Diagnosis not present

## 2018-03-28 DIAGNOSIS — L989 Disorder of the skin and subcutaneous tissue, unspecified: Secondary | ICD-10-CM | POA: Diagnosis not present

## 2018-03-29 DIAGNOSIS — L989 Disorder of the skin and subcutaneous tissue, unspecified: Secondary | ICD-10-CM | POA: Diagnosis not present

## 2018-03-29 DIAGNOSIS — M6281 Muscle weakness (generalized): Secondary | ICD-10-CM | POA: Diagnosis not present

## 2018-03-29 DIAGNOSIS — R293 Abnormal posture: Secondary | ICD-10-CM | POA: Diagnosis not present

## 2018-04-01 DIAGNOSIS — M6281 Muscle weakness (generalized): Secondary | ICD-10-CM | POA: Diagnosis not present

## 2018-04-01 DIAGNOSIS — L989 Disorder of the skin and subcutaneous tissue, unspecified: Secondary | ICD-10-CM | POA: Diagnosis not present

## 2018-04-01 DIAGNOSIS — R293 Abnormal posture: Secondary | ICD-10-CM | POA: Diagnosis not present

## 2018-04-17 DIAGNOSIS — F0280 Dementia in other diseases classified elsewhere without behavioral disturbance: Secondary | ICD-10-CM | POA: Diagnosis not present

## 2018-04-17 DIAGNOSIS — E119 Type 2 diabetes mellitus without complications: Secondary | ICD-10-CM | POA: Diagnosis not present

## 2018-04-17 DIAGNOSIS — G301 Alzheimer's disease with late onset: Secondary | ICD-10-CM | POA: Diagnosis not present

## 2018-04-17 DIAGNOSIS — B029 Zoster without complications: Secondary | ICD-10-CM | POA: Diagnosis not present

## 2018-05-02 DIAGNOSIS — G309 Alzheimer's disease, unspecified: Secondary | ICD-10-CM | POA: Diagnosis not present

## 2018-05-02 DIAGNOSIS — M6281 Muscle weakness (generalized): Secondary | ICD-10-CM | POA: Diagnosis not present

## 2018-05-02 DIAGNOSIS — L989 Disorder of the skin and subcutaneous tissue, unspecified: Secondary | ICD-10-CM | POA: Diagnosis not present

## 2018-05-02 DIAGNOSIS — R293 Abnormal posture: Secondary | ICD-10-CM | POA: Diagnosis not present

## 2018-05-03 DIAGNOSIS — R1312 Dysphagia, oropharyngeal phase: Secondary | ICD-10-CM | POA: Diagnosis not present

## 2018-05-03 DIAGNOSIS — G309 Alzheimer's disease, unspecified: Secondary | ICD-10-CM | POA: Diagnosis not present

## 2018-05-04 DIAGNOSIS — R1312 Dysphagia, oropharyngeal phase: Secondary | ICD-10-CM | POA: Diagnosis not present

## 2018-05-04 DIAGNOSIS — G309 Alzheimer's disease, unspecified: Secondary | ICD-10-CM | POA: Diagnosis not present

## 2018-05-05 DIAGNOSIS — G309 Alzheimer's disease, unspecified: Secondary | ICD-10-CM | POA: Diagnosis not present

## 2018-05-05 DIAGNOSIS — R1312 Dysphagia, oropharyngeal phase: Secondary | ICD-10-CM | POA: Diagnosis not present

## 2018-05-06 DIAGNOSIS — G309 Alzheimer's disease, unspecified: Secondary | ICD-10-CM | POA: Diagnosis not present

## 2018-05-06 DIAGNOSIS — R1312 Dysphagia, oropharyngeal phase: Secondary | ICD-10-CM | POA: Diagnosis not present

## 2018-05-08 DIAGNOSIS — R1312 Dysphagia, oropharyngeal phase: Secondary | ICD-10-CM | POA: Diagnosis not present

## 2018-05-08 DIAGNOSIS — G309 Alzheimer's disease, unspecified: Secondary | ICD-10-CM | POA: Diagnosis not present

## 2018-05-09 DIAGNOSIS — R1312 Dysphagia, oropharyngeal phase: Secondary | ICD-10-CM | POA: Diagnosis not present

## 2018-05-09 DIAGNOSIS — G309 Alzheimer's disease, unspecified: Secondary | ICD-10-CM | POA: Diagnosis not present

## 2018-05-10 DIAGNOSIS — R1312 Dysphagia, oropharyngeal phase: Secondary | ICD-10-CM | POA: Diagnosis not present

## 2018-05-10 DIAGNOSIS — G309 Alzheimer's disease, unspecified: Secondary | ICD-10-CM | POA: Diagnosis not present

## 2018-05-11 DIAGNOSIS — R1312 Dysphagia, oropharyngeal phase: Secondary | ICD-10-CM | POA: Diagnosis not present

## 2018-05-11 DIAGNOSIS — G309 Alzheimer's disease, unspecified: Secondary | ICD-10-CM | POA: Diagnosis not present

## 2018-05-29 DIAGNOSIS — I739 Peripheral vascular disease, unspecified: Secondary | ICD-10-CM | POA: Diagnosis not present

## 2018-05-29 DIAGNOSIS — E1140 Type 2 diabetes mellitus with diabetic neuropathy, unspecified: Secondary | ICD-10-CM | POA: Diagnosis not present

## 2018-05-29 DIAGNOSIS — B351 Tinea unguium: Secondary | ICD-10-CM | POA: Diagnosis not present

## 2018-05-29 DIAGNOSIS — L603 Nail dystrophy: Secondary | ICD-10-CM | POA: Diagnosis not present

## 2018-07-30 DIAGNOSIS — E876 Hypokalemia: Secondary | ICD-10-CM | POA: Diagnosis not present

## 2018-07-30 DIAGNOSIS — D508 Other iron deficiency anemias: Secondary | ICD-10-CM | POA: Diagnosis not present

## 2018-07-30 DIAGNOSIS — Z79899 Other long term (current) drug therapy: Secondary | ICD-10-CM | POA: Diagnosis not present

## 2018-07-31 DIAGNOSIS — Z79899 Other long term (current) drug therapy: Secondary | ICD-10-CM | POA: Diagnosis not present

## 2018-08-14 DIAGNOSIS — Z20828 Contact with and (suspected) exposure to other viral communicable diseases: Secondary | ICD-10-CM | POA: Diagnosis not present

## 2018-08-29 DIAGNOSIS — E119 Type 2 diabetes mellitus without complications: Secondary | ICD-10-CM | POA: Diagnosis not present

## 2018-08-29 DIAGNOSIS — M6281 Muscle weakness (generalized): Secondary | ICD-10-CM | POA: Diagnosis not present

## 2018-08-29 DIAGNOSIS — G309 Alzheimer's disease, unspecified: Secondary | ICD-10-CM | POA: Diagnosis not present

## 2018-08-29 DIAGNOSIS — R278 Other lack of coordination: Secondary | ICD-10-CM | POA: Diagnosis not present

## 2018-08-30 DIAGNOSIS — E119 Type 2 diabetes mellitus without complications: Secondary | ICD-10-CM | POA: Diagnosis not present

## 2018-08-30 DIAGNOSIS — R278 Other lack of coordination: Secondary | ICD-10-CM | POA: Diagnosis not present

## 2018-08-30 DIAGNOSIS — M6281 Muscle weakness (generalized): Secondary | ICD-10-CM | POA: Diagnosis not present

## 2018-08-30 DIAGNOSIS — G309 Alzheimer's disease, unspecified: Secondary | ICD-10-CM | POA: Diagnosis not present

## 2018-09-02 DIAGNOSIS — E119 Type 2 diabetes mellitus without complications: Secondary | ICD-10-CM | POA: Diagnosis not present

## 2018-09-02 DIAGNOSIS — G309 Alzheimer's disease, unspecified: Secondary | ICD-10-CM | POA: Diagnosis not present

## 2018-09-02 DIAGNOSIS — R278 Other lack of coordination: Secondary | ICD-10-CM | POA: Diagnosis not present

## 2018-09-02 DIAGNOSIS — M6281 Muscle weakness (generalized): Secondary | ICD-10-CM | POA: Diagnosis not present

## 2018-09-03 DIAGNOSIS — G309 Alzheimer's disease, unspecified: Secondary | ICD-10-CM | POA: Diagnosis not present

## 2018-09-03 DIAGNOSIS — R279 Unspecified lack of coordination: Secondary | ICD-10-CM | POA: Diagnosis not present

## 2018-09-03 DIAGNOSIS — M6281 Muscle weakness (generalized): Secondary | ICD-10-CM | POA: Diagnosis not present

## 2018-09-03 DIAGNOSIS — R1312 Dysphagia, oropharyngeal phase: Secondary | ICD-10-CM | POA: Diagnosis not present

## 2018-09-04 DIAGNOSIS — G309 Alzheimer's disease, unspecified: Secondary | ICD-10-CM | POA: Diagnosis not present

## 2018-09-04 DIAGNOSIS — M6281 Muscle weakness (generalized): Secondary | ICD-10-CM | POA: Diagnosis not present

## 2018-09-04 DIAGNOSIS — R279 Unspecified lack of coordination: Secondary | ICD-10-CM | POA: Diagnosis not present

## 2018-09-04 DIAGNOSIS — R1312 Dysphagia, oropharyngeal phase: Secondary | ICD-10-CM | POA: Diagnosis not present

## 2018-09-05 DIAGNOSIS — G309 Alzheimer's disease, unspecified: Secondary | ICD-10-CM | POA: Diagnosis not present

## 2018-09-05 DIAGNOSIS — M6281 Muscle weakness (generalized): Secondary | ICD-10-CM | POA: Diagnosis not present

## 2018-09-05 DIAGNOSIS — R1312 Dysphagia, oropharyngeal phase: Secondary | ICD-10-CM | POA: Diagnosis not present

## 2018-09-05 DIAGNOSIS — R279 Unspecified lack of coordination: Secondary | ICD-10-CM | POA: Diagnosis not present

## 2018-09-06 DIAGNOSIS — R279 Unspecified lack of coordination: Secondary | ICD-10-CM | POA: Diagnosis not present

## 2018-09-06 DIAGNOSIS — R1312 Dysphagia, oropharyngeal phase: Secondary | ICD-10-CM | POA: Diagnosis not present

## 2018-09-06 DIAGNOSIS — G309 Alzheimer's disease, unspecified: Secondary | ICD-10-CM | POA: Diagnosis not present

## 2018-09-06 DIAGNOSIS — M6281 Muscle weakness (generalized): Secondary | ICD-10-CM | POA: Diagnosis not present

## 2018-09-09 DIAGNOSIS — R279 Unspecified lack of coordination: Secondary | ICD-10-CM | POA: Diagnosis not present

## 2018-09-09 DIAGNOSIS — G309 Alzheimer's disease, unspecified: Secondary | ICD-10-CM | POA: Diagnosis not present

## 2018-09-09 DIAGNOSIS — M6281 Muscle weakness (generalized): Secondary | ICD-10-CM | POA: Diagnosis not present

## 2018-09-09 DIAGNOSIS — R1312 Dysphagia, oropharyngeal phase: Secondary | ICD-10-CM | POA: Diagnosis not present

## 2018-09-10 DIAGNOSIS — R279 Unspecified lack of coordination: Secondary | ICD-10-CM | POA: Diagnosis not present

## 2018-09-10 DIAGNOSIS — R1312 Dysphagia, oropharyngeal phase: Secondary | ICD-10-CM | POA: Diagnosis not present

## 2018-09-10 DIAGNOSIS — G309 Alzheimer's disease, unspecified: Secondary | ICD-10-CM | POA: Diagnosis not present

## 2018-09-10 DIAGNOSIS — M6281 Muscle weakness (generalized): Secondary | ICD-10-CM | POA: Diagnosis not present

## 2018-09-11 DIAGNOSIS — R1312 Dysphagia, oropharyngeal phase: Secondary | ICD-10-CM | POA: Diagnosis not present

## 2018-09-11 DIAGNOSIS — R279 Unspecified lack of coordination: Secondary | ICD-10-CM | POA: Diagnosis not present

## 2018-09-11 DIAGNOSIS — M6281 Muscle weakness (generalized): Secondary | ICD-10-CM | POA: Diagnosis not present

## 2018-09-11 DIAGNOSIS — G309 Alzheimer's disease, unspecified: Secondary | ICD-10-CM | POA: Diagnosis not present

## 2018-09-12 DIAGNOSIS — R279 Unspecified lack of coordination: Secondary | ICD-10-CM | POA: Diagnosis not present

## 2018-09-12 DIAGNOSIS — R1312 Dysphagia, oropharyngeal phase: Secondary | ICD-10-CM | POA: Diagnosis not present

## 2018-09-12 DIAGNOSIS — G309 Alzheimer's disease, unspecified: Secondary | ICD-10-CM | POA: Diagnosis not present

## 2018-09-12 DIAGNOSIS — M6281 Muscle weakness (generalized): Secondary | ICD-10-CM | POA: Diagnosis not present

## 2018-09-13 DIAGNOSIS — R279 Unspecified lack of coordination: Secondary | ICD-10-CM | POA: Diagnosis not present

## 2018-09-13 DIAGNOSIS — G309 Alzheimer's disease, unspecified: Secondary | ICD-10-CM | POA: Diagnosis not present

## 2018-09-13 DIAGNOSIS — M6281 Muscle weakness (generalized): Secondary | ICD-10-CM | POA: Diagnosis not present

## 2018-09-13 DIAGNOSIS — R1312 Dysphagia, oropharyngeal phase: Secondary | ICD-10-CM | POA: Diagnosis not present

## 2018-09-16 DIAGNOSIS — M6281 Muscle weakness (generalized): Secondary | ICD-10-CM | POA: Diagnosis not present

## 2018-09-16 DIAGNOSIS — G309 Alzheimer's disease, unspecified: Secondary | ICD-10-CM | POA: Diagnosis not present

## 2018-09-16 DIAGNOSIS — R1312 Dysphagia, oropharyngeal phase: Secondary | ICD-10-CM | POA: Diagnosis not present

## 2018-09-16 DIAGNOSIS — R279 Unspecified lack of coordination: Secondary | ICD-10-CM | POA: Diagnosis not present

## 2018-09-17 DIAGNOSIS — G309 Alzheimer's disease, unspecified: Secondary | ICD-10-CM | POA: Diagnosis not present

## 2018-09-17 DIAGNOSIS — R1312 Dysphagia, oropharyngeal phase: Secondary | ICD-10-CM | POA: Diagnosis not present

## 2018-09-17 DIAGNOSIS — M6281 Muscle weakness (generalized): Secondary | ICD-10-CM | POA: Diagnosis not present

## 2018-09-17 DIAGNOSIS — R279 Unspecified lack of coordination: Secondary | ICD-10-CM | POA: Diagnosis not present

## 2018-09-18 DIAGNOSIS — M6281 Muscle weakness (generalized): Secondary | ICD-10-CM | POA: Diagnosis not present

## 2018-09-18 DIAGNOSIS — R1312 Dysphagia, oropharyngeal phase: Secondary | ICD-10-CM | POA: Diagnosis not present

## 2018-09-18 DIAGNOSIS — R279 Unspecified lack of coordination: Secondary | ICD-10-CM | POA: Diagnosis not present

## 2018-09-18 DIAGNOSIS — G309 Alzheimer's disease, unspecified: Secondary | ICD-10-CM | POA: Diagnosis not present

## 2018-09-19 DIAGNOSIS — G309 Alzheimer's disease, unspecified: Secondary | ICD-10-CM | POA: Diagnosis not present

## 2018-09-19 DIAGNOSIS — R279 Unspecified lack of coordination: Secondary | ICD-10-CM | POA: Diagnosis not present

## 2018-09-19 DIAGNOSIS — R1312 Dysphagia, oropharyngeal phase: Secondary | ICD-10-CM | POA: Diagnosis not present

## 2018-09-19 DIAGNOSIS — M6281 Muscle weakness (generalized): Secondary | ICD-10-CM | POA: Diagnosis not present

## 2018-09-20 DIAGNOSIS — R279 Unspecified lack of coordination: Secondary | ICD-10-CM | POA: Diagnosis not present

## 2018-09-20 DIAGNOSIS — G309 Alzheimer's disease, unspecified: Secondary | ICD-10-CM | POA: Diagnosis not present

## 2018-09-20 DIAGNOSIS — R1312 Dysphagia, oropharyngeal phase: Secondary | ICD-10-CM | POA: Diagnosis not present

## 2018-09-20 DIAGNOSIS — M6281 Muscle weakness (generalized): Secondary | ICD-10-CM | POA: Diagnosis not present

## 2018-09-23 DIAGNOSIS — R1312 Dysphagia, oropharyngeal phase: Secondary | ICD-10-CM | POA: Diagnosis not present

## 2018-09-23 DIAGNOSIS — M6281 Muscle weakness (generalized): Secondary | ICD-10-CM | POA: Diagnosis not present

## 2018-09-23 DIAGNOSIS — R279 Unspecified lack of coordination: Secondary | ICD-10-CM | POA: Diagnosis not present

## 2018-09-23 DIAGNOSIS — G309 Alzheimer's disease, unspecified: Secondary | ICD-10-CM | POA: Diagnosis not present

## 2018-09-24 DIAGNOSIS — R279 Unspecified lack of coordination: Secondary | ICD-10-CM | POA: Diagnosis not present

## 2018-09-24 DIAGNOSIS — M6281 Muscle weakness (generalized): Secondary | ICD-10-CM | POA: Diagnosis not present

## 2018-09-24 DIAGNOSIS — G309 Alzheimer's disease, unspecified: Secondary | ICD-10-CM | POA: Diagnosis not present

## 2018-09-24 DIAGNOSIS — R1312 Dysphagia, oropharyngeal phase: Secondary | ICD-10-CM | POA: Diagnosis not present

## 2018-09-25 DIAGNOSIS — G309 Alzheimer's disease, unspecified: Secondary | ICD-10-CM | POA: Diagnosis not present

## 2018-09-25 DIAGNOSIS — R279 Unspecified lack of coordination: Secondary | ICD-10-CM | POA: Diagnosis not present

## 2018-09-25 DIAGNOSIS — M6281 Muscle weakness (generalized): Secondary | ICD-10-CM | POA: Diagnosis not present

## 2018-09-25 DIAGNOSIS — R1312 Dysphagia, oropharyngeal phase: Secondary | ICD-10-CM | POA: Diagnosis not present

## 2018-09-26 DIAGNOSIS — R279 Unspecified lack of coordination: Secondary | ICD-10-CM | POA: Diagnosis not present

## 2018-09-26 DIAGNOSIS — M6281 Muscle weakness (generalized): Secondary | ICD-10-CM | POA: Diagnosis not present

## 2018-09-26 DIAGNOSIS — R1312 Dysphagia, oropharyngeal phase: Secondary | ICD-10-CM | POA: Diagnosis not present

## 2018-09-26 DIAGNOSIS — G309 Alzheimer's disease, unspecified: Secondary | ICD-10-CM | POA: Diagnosis not present

## 2018-09-27 DIAGNOSIS — R1312 Dysphagia, oropharyngeal phase: Secondary | ICD-10-CM | POA: Diagnosis not present

## 2018-09-27 DIAGNOSIS — M6281 Muscle weakness (generalized): Secondary | ICD-10-CM | POA: Diagnosis not present

## 2018-09-27 DIAGNOSIS — R279 Unspecified lack of coordination: Secondary | ICD-10-CM | POA: Diagnosis not present

## 2018-09-27 DIAGNOSIS — G309 Alzheimer's disease, unspecified: Secondary | ICD-10-CM | POA: Diagnosis not present

## 2018-09-28 DIAGNOSIS — E119 Type 2 diabetes mellitus without complications: Secondary | ICD-10-CM | POA: Diagnosis not present

## 2018-09-28 DIAGNOSIS — M1990 Unspecified osteoarthritis, unspecified site: Secondary | ICD-10-CM | POA: Diagnosis not present

## 2018-09-28 DIAGNOSIS — R634 Abnormal weight loss: Secondary | ICD-10-CM | POA: Diagnosis not present

## 2018-09-28 DIAGNOSIS — G301 Alzheimer's disease with late onset: Secondary | ICD-10-CM | POA: Diagnosis not present

## 2018-09-30 DIAGNOSIS — M6281 Muscle weakness (generalized): Secondary | ICD-10-CM | POA: Diagnosis not present

## 2018-09-30 DIAGNOSIS — G309 Alzheimer's disease, unspecified: Secondary | ICD-10-CM | POA: Diagnosis not present

## 2018-09-30 DIAGNOSIS — R1312 Dysphagia, oropharyngeal phase: Secondary | ICD-10-CM | POA: Diagnosis not present

## 2018-09-30 DIAGNOSIS — R279 Unspecified lack of coordination: Secondary | ICD-10-CM | POA: Diagnosis not present

## 2018-10-01 DIAGNOSIS — R1312 Dysphagia, oropharyngeal phase: Secondary | ICD-10-CM | POA: Diagnosis not present

## 2018-10-01 DIAGNOSIS — R279 Unspecified lack of coordination: Secondary | ICD-10-CM | POA: Diagnosis not present

## 2018-10-01 DIAGNOSIS — G309 Alzheimer's disease, unspecified: Secondary | ICD-10-CM | POA: Diagnosis not present

## 2018-10-01 DIAGNOSIS — M6281 Muscle weakness (generalized): Secondary | ICD-10-CM | POA: Diagnosis not present

## 2018-10-02 DIAGNOSIS — G309 Alzheimer's disease, unspecified: Secondary | ICD-10-CM | POA: Diagnosis not present

## 2018-10-02 DIAGNOSIS — M6281 Muscle weakness (generalized): Secondary | ICD-10-CM | POA: Diagnosis not present

## 2018-10-02 DIAGNOSIS — R1312 Dysphagia, oropharyngeal phase: Secondary | ICD-10-CM | POA: Diagnosis not present

## 2018-10-02 DIAGNOSIS — R279 Unspecified lack of coordination: Secondary | ICD-10-CM | POA: Diagnosis not present

## 2018-10-03 DIAGNOSIS — R1312 Dysphagia, oropharyngeal phase: Secondary | ICD-10-CM | POA: Diagnosis not present

## 2018-10-03 DIAGNOSIS — G309 Alzheimer's disease, unspecified: Secondary | ICD-10-CM | POA: Diagnosis not present

## 2018-10-04 DIAGNOSIS — G309 Alzheimer's disease, unspecified: Secondary | ICD-10-CM | POA: Diagnosis not present

## 2018-10-04 DIAGNOSIS — R1312 Dysphagia, oropharyngeal phase: Secondary | ICD-10-CM | POA: Diagnosis not present

## 2018-10-07 DIAGNOSIS — R1312 Dysphagia, oropharyngeal phase: Secondary | ICD-10-CM | POA: Diagnosis not present

## 2018-10-07 DIAGNOSIS — G309 Alzheimer's disease, unspecified: Secondary | ICD-10-CM | POA: Diagnosis not present

## 2018-10-08 DIAGNOSIS — G309 Alzheimer's disease, unspecified: Secondary | ICD-10-CM | POA: Diagnosis not present

## 2018-10-08 DIAGNOSIS — R1312 Dysphagia, oropharyngeal phase: Secondary | ICD-10-CM | POA: Diagnosis not present

## 2018-10-09 DIAGNOSIS — R1312 Dysphagia, oropharyngeal phase: Secondary | ICD-10-CM | POA: Diagnosis not present

## 2018-10-09 DIAGNOSIS — G309 Alzheimer's disease, unspecified: Secondary | ICD-10-CM | POA: Diagnosis not present

## 2018-10-11 DIAGNOSIS — R1312 Dysphagia, oropharyngeal phase: Secondary | ICD-10-CM | POA: Diagnosis not present

## 2018-10-11 DIAGNOSIS — G309 Alzheimer's disease, unspecified: Secondary | ICD-10-CM | POA: Diagnosis not present

## 2018-10-14 DIAGNOSIS — G309 Alzheimer's disease, unspecified: Secondary | ICD-10-CM | POA: Diagnosis not present

## 2018-10-14 DIAGNOSIS — R1312 Dysphagia, oropharyngeal phase: Secondary | ICD-10-CM | POA: Diagnosis not present

## 2018-10-15 DIAGNOSIS — R1312 Dysphagia, oropharyngeal phase: Secondary | ICD-10-CM | POA: Diagnosis not present

## 2018-10-15 DIAGNOSIS — G309 Alzheimer's disease, unspecified: Secondary | ICD-10-CM | POA: Diagnosis not present

## 2018-10-17 DIAGNOSIS — G309 Alzheimer's disease, unspecified: Secondary | ICD-10-CM | POA: Diagnosis not present

## 2018-10-17 DIAGNOSIS — R1312 Dysphagia, oropharyngeal phase: Secondary | ICD-10-CM | POA: Diagnosis not present

## 2018-10-21 DIAGNOSIS — R1312 Dysphagia, oropharyngeal phase: Secondary | ICD-10-CM | POA: Diagnosis not present

## 2018-10-21 DIAGNOSIS — G309 Alzheimer's disease, unspecified: Secondary | ICD-10-CM | POA: Diagnosis not present

## 2018-10-23 DIAGNOSIS — G309 Alzheimer's disease, unspecified: Secondary | ICD-10-CM | POA: Diagnosis not present

## 2018-10-23 DIAGNOSIS — R1312 Dysphagia, oropharyngeal phase: Secondary | ICD-10-CM | POA: Diagnosis not present

## 2018-10-24 DIAGNOSIS — R1312 Dysphagia, oropharyngeal phase: Secondary | ICD-10-CM | POA: Diagnosis not present

## 2018-10-24 DIAGNOSIS — G309 Alzheimer's disease, unspecified: Secondary | ICD-10-CM | POA: Diagnosis not present

## 2018-11-27 DIAGNOSIS — E119 Type 2 diabetes mellitus without complications: Secondary | ICD-10-CM | POA: Diagnosis not present

## 2018-11-27 DIAGNOSIS — E8809 Other disorders of plasma-protein metabolism, not elsewhere classified: Secondary | ICD-10-CM | POA: Diagnosis not present

## 2018-11-27 DIAGNOSIS — G301 Alzheimer's disease with late onset: Secondary | ICD-10-CM | POA: Diagnosis not present

## 2018-11-27 DIAGNOSIS — F0280 Dementia in other diseases classified elsewhere without behavioral disturbance: Secondary | ICD-10-CM | POA: Diagnosis not present

## 2018-12-04 DIAGNOSIS — F0280 Dementia in other diseases classified elsewhere without behavioral disturbance: Secondary | ICD-10-CM | POA: Diagnosis not present

## 2018-12-04 DIAGNOSIS — G301 Alzheimer's disease with late onset: Secondary | ICD-10-CM | POA: Diagnosis not present

## 2018-12-04 DIAGNOSIS — R627 Adult failure to thrive: Secondary | ICD-10-CM | POA: Diagnosis not present

## 2018-12-04 DIAGNOSIS — L89150 Pressure ulcer of sacral region, unstageable: Secondary | ICD-10-CM | POA: Diagnosis not present

## 2018-12-10 DIAGNOSIS — R52 Pain, unspecified: Secondary | ICD-10-CM | POA: Diagnosis not present

## 2018-12-10 DIAGNOSIS — L89150 Pressure ulcer of sacral region, unstageable: Secondary | ICD-10-CM | POA: Diagnosis not present

## 2019-02-03 DEATH — deceased
